# Patient Record
Sex: Male | Born: 1960
Health system: Southern US, Community
[De-identification: ages and names within clinical notes are randomized; demographics above are authoritative.]

## PROBLEM LIST (undated history)

## (undated) DIAGNOSIS — M1990 Unspecified osteoarthritis, unspecified site: Secondary | ICD-10-CM

## (undated) DIAGNOSIS — I2510 Atherosclerotic heart disease of native coronary artery without angina pectoris: Secondary | ICD-10-CM

## (undated) DIAGNOSIS — I10 Essential (primary) hypertension: Secondary | ICD-10-CM

## (undated) HISTORY — PX: FRACTURE SURGERY: SHX138

## (undated) HISTORY — PX: CARDIAC CATHETERIZATION: SHX172

## (undated) HISTORY — PX: HERNIA REPAIR: SHX51

## (undated) HISTORY — DX: Essential (primary) hypertension: I10

## (undated) HISTORY — DX: Unspecified osteoarthritis, unspecified site: M19.90

---

## 1999-05-06 ENCOUNTER — Emergency Department (HOSPITAL_COMMUNITY): Admission: EM | Admit: 1999-05-06 | Discharge: 1999-05-06 | Payer: Self-pay

## 1999-07-27 ENCOUNTER — Emergency Department (HOSPITAL_COMMUNITY): Admission: EM | Admit: 1999-07-27 | Discharge: 1999-07-28 | Payer: Self-pay

## 1999-07-28 ENCOUNTER — Encounter: Payer: Self-pay | Admitting: Emergency Medicine

## 2002-03-12 ENCOUNTER — Encounter: Payer: Self-pay | Admitting: Emergency Medicine

## 2002-03-12 ENCOUNTER — Emergency Department (HOSPITAL_COMMUNITY): Admission: EM | Admit: 2002-03-12 | Discharge: 2002-03-12 | Payer: Self-pay | Admitting: Emergency Medicine

## 2004-01-26 ENCOUNTER — Emergency Department (HOSPITAL_COMMUNITY): Admission: EM | Admit: 2004-01-26 | Discharge: 2004-01-26 | Payer: Self-pay | Admitting: Emergency Medicine

## 2004-01-28 ENCOUNTER — Ambulatory Visit (HOSPITAL_COMMUNITY): Admission: RE | Admit: 2004-01-28 | Discharge: 2004-01-28 | Payer: Self-pay | Admitting: Orthopedic Surgery

## 2004-01-28 ENCOUNTER — Ambulatory Visit (HOSPITAL_BASED_OUTPATIENT_CLINIC_OR_DEPARTMENT_OTHER): Admission: RE | Admit: 2004-01-28 | Discharge: 2004-01-28 | Payer: Self-pay | Admitting: Orthopedic Surgery

## 2009-05-21 ENCOUNTER — Emergency Department (HOSPITAL_COMMUNITY): Admission: EM | Admit: 2009-05-21 | Discharge: 2009-05-21 | Payer: Self-pay | Admitting: Emergency Medicine

## 2011-04-08 NOTE — Op Note (Signed)
NAME:  John Delacruz, John Delacruz                           ACCOUNT NO.:  [id number]   MEDICAL RECORD NO.:  [id number]                   PATIENT TYPE:  AMB   LOCATION:  DSC                                  FACILITY:  [facility]   PHYSICIAN:  Matthew A. Mina Marble, M.D.           DATE OF BIRTH:  08/30/1961   DATE OF PROCEDURE:  01/28/2004  DATE OF DISCHARGE:                                 OPERATIVE REPORT   PREOPERATIVE DIAGNOSIS:  Displaced intra-articular left distal radius  fracture.   POSTOPERATIVE DIAGNOSIS:  Displaced intra-articular left distal radius  fracture.   PROCEDURE:  Open reduction and internal fixation of above fracture using DVR  plate and screws.   SURGEON:  Artist Pais. Mina Marble, M.D.   ASSISTANT:  None.   ANESTHESIA:  General.   TOURNIQUET TIME:  1 hour 5 minutes.   COMPLICATIONS:  None.   DRAINS:  None.   DESCRIPTION OF PROCEDURE:  The patient was taken to the operating room and  after the induction of adequate general anesthesia, the left upper extremity  was prepped and draped in the usual sterile fashion.  An Esmarch was used to  exsanguinate the limb.  The tourniquet was inflated to 250 mmHg.  At this  point in time a longitudinal incision was made over the palpable border of  the flexor carpi radialis tendon in the distal forearm and wrist area left  side.  Dissection was taken down through the skin and subcutaneous tissue.  The sheath overlying the FCR was incised.  The FCR was retracted in the  midline.  The fascia underlying the muscle was incised.  The radial artery  was retracted laterally.  Incision was developed down to the pronator  quadratus muscle.  The pronator quadratus was seen to be stripped off the  distal radius in the area of the fracture. It was subperiosteally dissected  off the distal radius in an L-shaped fashion, thus exposing the fracture  site.  The first dorsal compartment and brachial radialis insertion were  also incised, thus helping  reduce the radial styloid fragment. After this  was done, and temporary fixation was achieved using the DVR plate and K-  wires, intraoperative x-ray revealed good placement in both the AP and  lateral plane. This was then fixed using a single cortical screw in the  slotted hole following by placing all of the pegs anatomically under direct  fluoroscopic guidance to reduce the fracture with the scaphoid and lunate  fragments.  The remaining cortical screws were placed.  The wound was then  thoroughly irrigated.  Intraoperative x-ray showed good reduction in both  the AP, lateral, and oblique view.  Pronator quadratus was repaired with 3-0  Vicryl and the skin with running 3-0 Prolene subcuticular stitch.  Steri-  Strips, 4x4's, fluffs, compressive dressing, and volar splint was applied.  The patient tolerated the procedure well and went to the recovery room in  stable  condition.                                              Artist Pais Mina Marble, M.D.   MAW/MEDQ  D:  01/28/2004  T:  01/29/2004  Job:  [id number]

## 2011-04-08 NOTE — Consult Note (Signed)
NAME:  John Delacruz, FIGIEL NO.:  [id number]   MEDICAL RECORD NO.:  [id number]                   PATIENT TYPE:  EMS   LOCATION:  MAJO                                 FACILITY:  [facility]   PHYSICIAN:  Artist Pais. Mina Marble, M.D.           DATE OF BIRTH:  1961-07-07   DATE OF CONSULTATION:  01/26/2004  DATE OF DISCHARGE:                                   CONSULTATION   CONSULTING PHYSICIAN:  Artist Pais. Mina Marble, M.D.   REQUESTING PHYSICIAN:  Dr. Payton Spark. Wentz.   REASON FOR CONSULTATION:  Mr. Chapin Arduini is a 50 year old right hand  dominant male who fell 10 feet off of a ladder while working and sustained  an injury to his nondominant left hand and wrist.  He is 50 years old, right  hand dominant, otherwise fairly healthy.   No known drug allergies.   No current medications.   No recent hospitalizations, surgeries.   FAMILY HISTORY:  Noncontributory.   SOCIAL HISTORY:  Noncontributory.   PHYSICAL EXAMINATION:  GENERAL:  A well-developed, well-nourished male,  pleasant, alert and oriented x 3.  MUSCULOSKELETAL:  Examination of his hand and wrist, on the left:  He is  swollen and tender with a marked deformity.  He is grossly neurovascularly  intact with median, radial and ulnar nerve function intact grossly.  Skin is  intact.  There is no __________, no adenopathy, no open areas but again  dorsal swelling, pain, and deformity.   X-rays show a dorsally angulated, comminuted, intra-articular fracture of  the distal radius, left side.   The patient was given a 2% plain lidocaine hematoma block.  Fingertrap  traction was applied with 15 pounds counter-traction across the radiocarpal  joint.  Closed reduction was performed.  Post reduction films showed  adequate reduction on the lateral view but significant intra-articular  comminution of the large radial styloid and a  lunate facet fragment.   IMPRESSION:  A 50 year old male, status post closed  reduction, with  continued intra-articular displacement of a radius fracture, nondominant  left side.   He is to report to my office tomorrow, we will set him up for surgical  intervention.  He was discharged with Percocet, #30, for pain, 1-2 every  three to four hours.  Ice, elevation.  He was instructed in the signs and  symptoms  of compartment syndrome and acute carpal tunnel, etcetera.  He is to call  our office immediately if any of these symptoms arise, if not we will see  him again tomorrow, Tuesday, January 27, 2004, for operative outpatient surgery  setup.                                               Artist Pais Mina Marble, M.D.    MAW/MEDQ  D:  01/26/2004  T:  01/27/2004  Job:  [id number]

## 2013-01-01 ENCOUNTER — Ambulatory Visit (INDEPENDENT_AMBULATORY_CARE_PROVIDER_SITE_OTHER): Payer: BC Managed Care – PPO | Admitting: Internal Medicine

## 2013-01-01 ENCOUNTER — Ambulatory Visit: Payer: BC Managed Care – PPO

## 2013-01-01 VITALS — BP 187/103 | HR 76 | Temp 98.5°F | Resp 16 | Ht 70.0 in | Wt 202.0 lb

## 2013-01-01 DIAGNOSIS — M545 Low back pain, unspecified: Secondary | ICD-10-CM

## 2013-01-01 DIAGNOSIS — F17200 Nicotine dependence, unspecified, uncomplicated: Secondary | ICD-10-CM

## 2013-01-01 DIAGNOSIS — I10 Essential (primary) hypertension: Secondary | ICD-10-CM

## 2013-01-01 DIAGNOSIS — Z719 Counseling, unspecified: Secondary | ICD-10-CM

## 2013-01-01 LAB — POCT CBC
Granulocyte percent: 70.6 %G (ref 37–80)
HCT, POC: 50.1 % (ref 43.5–53.7)
Hemoglobin: 16 g/dL (ref 14.1–18.1)
Lymph, poc: 2.2 (ref 0.6–3.4)
MCH, POC: 28.2 pg (ref 27–31.2)
MCHC: 31.9 g/dL (ref 31.8–35.4)
MCV: 88.4 fL (ref 80–97)
MID (cbc): 0.7 (ref 0–0.9)
MPV: 9.8 fL (ref 0–99.8)
POC Granulocyte: 7.1 — AB (ref 2–6.9)
POC LYMPH PERCENT: 22.3 %L (ref 10–50)
POC MID %: 7.1 %M (ref 0–12)
Platelet Count, POC: 249 10*3/uL (ref 142–424)
RBC: 5.67 M/uL (ref 4.69–6.13)
RDW, POC: 14.9 %
WBC: 10 10*3/uL (ref 4.6–10.2)

## 2013-01-01 LAB — POCT URINALYSIS DIPSTICK
Bilirubin, UA: NEGATIVE
Glucose, UA: NEGATIVE
Leukocytes, UA: NEGATIVE
Nitrite, UA: NEGATIVE
Protein, UA: >=300
Spec Grav, UA: >=1.03
Urobilinogen, UA: 0.2
pH, UA: 5.5

## 2013-01-01 LAB — POCT UA - MICROSCOPIC ONLY
Bacteria, U Microscopic: NEGATIVE
Casts, Ur, LPF, POC: NEGATIVE
Crystals, Ur, HPF, POC: NEGATIVE
Mucus, UA: NEGATIVE
Yeast, UA: NEGATIVE

## 2013-01-01 LAB — POCT SEDIMENTATION RATE: POCT SED RATE: 13 mm/hr (ref 0–22)

## 2013-01-01 MED ORDER — METHOCARBAMOL 750 MG PO TABS: 750.0000 mg | ORAL_TABLET | Freq: Four times a day (QID) | ORAL | Status: DC

## 2013-01-01 MED ORDER — HYDROCODONE-ACETAMINOPHEN 5-325 MG PO TABS: 1.0000 | ORAL_TABLET | Freq: Four times a day (QID) | ORAL | Status: DC | PRN

## 2013-01-01 MED ORDER — IBUPROFEN 800 MG PO TABS: 800.0000 mg | ORAL_TABLET | Freq: Three times a day (TID) | ORAL | Status: DC | PRN

## 2013-01-01 NOTE — Patient Instructions (Signed)
Back Pain, Adult Low back pain is very common. About 1 in 5 people have back pain.The cause of low back pain is rarely dangerous. The pain often gets better over time.About half of people with a sudden onset of back pain feel better in just 2 weeks. About 8 in 10 people feel better by 6 weeks.  CAUSES Some common causes of back pain include:  Strain of the muscles or ligaments supporting the spine.  Wear and tear (degeneration) of the spinal discs.  Arthritis.  Direct injury to the back. DIAGNOSIS Most of the time, the direct cause of low back pain is not known.However, back pain can be treated effectively even when the exact cause of the pain is unknown.Answering your caregiver's questions about your overall health and symptoms is one of the most accurate ways to make sure the cause of your pain is not dangerous. If your caregiver needs more information, he or she may order lab work or imaging tests (X-rays or MRIs).However, even if imaging tests show changes in your back, this usually does not require surgery. HOME CARE INSTRUCTIONS For many people, back pain returns.Since low back pain is rarely dangerous, it is often a condition that people can learn to manageon their own.   Remain active. It is stressful on the back to sit or stand in one place. Do not sit, drive, or stand in one place for more than 30 minutes at a time. Take short walks on level surfaces as soon as pain allows.Try to increase the length of time you walk each day.  Do not stay in bed.Resting more than 1 or 2 days can delay your recovery.  Do not avoid exercise or work.Your body is made to move.It is not dangerous to be active, even though your back may hurt.Your back will likely heal faster if you return to being active before your pain is gone.  Pay attention to your body when you bend and lift. Many people have less discomfortwhen lifting if they bend their knees, keep the load close to their bodies,and  avoid twisting. Often, the most comfortable positions are those that put less stress on your recovering back.  Find a comfortable position to sleep. Use a firm mattress and lie on your side with your knees slightly bent. If you lie on your back, put a pillow under your knees.  Only take over-the-counter or prescription medicines as directed by your caregiver. Over-the-counter medicines to reduce pain and inflammation are often the most helpful.Your caregiver may prescribe muscle relaxant drugs.These medicines help dull your pain so you can more quickly return to your normal activities and healthy exercise.  Put ice on the injured area.  Put ice in a plastic bag.  Place a towel between your skin and the bag.  Leave the ice on for 15 to 20 minutes, 3 to 4 times a day for the first 2 to 3 days. After that, ice and heat may be alternated to reduce pain and spasms.  Ask your caregiver about trying back exercises and gentle massage. This may be of some benefit.  Avoid feeling anxious or stressed.Stress increases muscle tension and can worsen back pain.It is important to recognize when you are anxious or stressed and learn ways to manage it.Exercise is a great option. SEEK MEDICAL CARE IF:  You have pain that is not relieved with rest or medicine.  You have pain that does not improve in 1 week.  You have new symptoms.  You are generally   not feeling well. SEEK IMMEDIATE MEDICAL CARE IF:   You have pain that radiates from your back into your legs.  You develop new bowel or bladder control problems.  You have unusual weakness or numbness in your arms or legs.  You develop nausea or vomiting.  You develop abdominal pain.  You feel faint. Document Released: 11/07/2005 Document Revised: 05/08/2012 Document Reviewed: 03/28/2011 [employer] Patient Information 2013 [employer].  

## 2013-01-01 NOTE — Progress Notes (Signed)
Subjective:    Patient ID: John Delacruz, male    DOB: May 25, 1961, 52 y.o.   MRN: [id number]  HPI C/o upper lumbar pain for 2 weeks and thinks he injured himself doing the hard labour or heavy weight lifting work out. Has no pain with any motion during the day and can do his job, only has pain at night lying on his back and it can wake him up. No blood in stool or urine change. No fatigue, nite sweats, weight loss or feeling ill. Has htn and not taking his meds, continues to smoke too much and drink 2-3 beers a day.   Review of Systems     Objective:   Physical Exam  Vitals reviewed. Constitutional: He is oriented to person, place, and time. He appears well-developed and well-nourished.  Cardiovascular: Normal rate, regular rhythm, normal heart sounds and intact distal pulses.   No murmur heard. Pulmonary/Chest: Effort normal and breath sounds normal. He exhibits no tenderness.  Abdominal: Soft. He exhibits mass. He exhibits no abdominal bruit. There is no tenderness. There is no rebound, no guarding and no CVA tenderness.  Musculoskeletal: He exhibits tenderness.       Lumbar back: He exhibits tenderness and pain. He exhibits normal range of motion, no bony tenderness, no deformity and normal pulse.  Neurological: He is alert and oriented to person, place, and time. No cranial nerve deficit. He exhibits normal muscle tone. Coordination normal.  Skin: No rash noted.  Psychiatric: He has a normal mood and affect.   180/98 Results for orders placed in visit on 01/01/13  POCT CBC      Result Value Range   WBC 10.0  4.6 - 10.2 K/uL   Lymph, poc 2.2  0.6 - 3.4   POC LYMPH PERCENT 22.3  10 - 50 %L   MID (cbc) 0.7  0 - 0.9   POC MID % 7.1  0 - 12 %M   POC Granulocyte 7.1 (*) 2 - 6.9   Granulocyte percent 70.6  37 - 80 %G   RBC 5.67  4.69 - 6.13 M/uL   Hemoglobin 16.0  14.1 - 18.1 g/dL   HCT, POC 78.2  95.6 - 53.7 %   MCV 88.4  80 - 97 fL   MCH, POC 28.2  27 - 31.2 pg   MCHC 31.9   31.8 - 35.4 g/dL   RDW, POC 21.3     Platelet Count, POC 249  142 - 424 K/uL   MPV 9.8  0 - 99.8 fL  POCT UA - MICROSCOPIC ONLY      Result Value Range   WBC, Ur, HPF, POC 0-2     RBC, urine, microscopic 0-3     Bacteria, U Microscopic neg     Mucus, UA neg     Epithelial cells, urine per micros 0-2     Crystals, Ur, HPF, POC neg     Casts, Ur, LPF, POC neg     Yeast, UA neg    POCT URINALYSIS DIPSTICK      Result Value Range   Color, UA yellow     Clarity, UA clear     Glucose, UA neg     Bilirubin, UA neg     Ketones, UA trace     Spec Grav, UA >=1.030     Blood, UA trace-lysed     pH, UA 5.5     Protein, UA >=300     Urobilinogen, UA 0.2  Nitrite, UA neg     Leukocytes, UA Negative       [facility] reading (PRIMARY) by  Dr.Keirra Zeimet mild spondylosis, NAD      Assessment & Plan:  Stop smoking/Take bp meds/ Decrease or stop alcohol Back stretches/Motrin/HC at hs prn CT scan if pain persists

## 2013-01-02 ENCOUNTER — Encounter: Payer: Self-pay | Admitting: *Deleted

## 2013-12-18 ENCOUNTER — Emergency Department (HOSPITAL_COMMUNITY)
Admission: EM | Admit: 2013-12-18 | Discharge: 2013-12-18 | Disposition: A | Discharge status: Other Acute Inpatient Hospital | Payer: BC Managed Care – PPO | Source: Home / Self Care | Attending: Emergency Medicine | Admitting: Emergency Medicine

## 2013-12-18 ENCOUNTER — Emergency Department (HOSPITAL_COMMUNITY): Payer: BC Managed Care – PPO

## 2013-12-18 ENCOUNTER — Inpatient Hospital Stay (HOSPITAL_COMMUNITY)
Admission: EM | Admit: 2013-12-18 | Discharge: 2013-12-19 | DRG: 305 | Disposition: A | Discharge status: Home / Self Care | Payer: BC Managed Care – PPO | Attending: Internal Medicine | Admitting: Internal Medicine

## 2013-12-18 ENCOUNTER — Encounter (HOSPITAL_COMMUNITY): Payer: Self-pay | Admitting: Emergency Medicine

## 2013-12-18 DIAGNOSIS — E876 Hypokalemia: Secondary | ICD-10-CM | POA: Diagnosis present

## 2013-12-18 DIAGNOSIS — K819 Cholecystitis, unspecified: Secondary | ICD-10-CM

## 2013-12-18 DIAGNOSIS — K5732 Diverticulitis of large intestine without perforation or abscess without bleeding: Secondary | ICD-10-CM

## 2013-12-18 DIAGNOSIS — K5792 Diverticulitis of intestine, part unspecified, without perforation or abscess without bleeding: Secondary | ICD-10-CM

## 2013-12-18 DIAGNOSIS — Z9119 Patient's noncompliance with other medical treatment and regimen: Secondary | ICD-10-CM

## 2013-12-18 DIAGNOSIS — Z79899 Other long term (current) drug therapy: Secondary | ICD-10-CM

## 2013-12-18 DIAGNOSIS — F17200 Nicotine dependence, unspecified, uncomplicated: Secondary | ICD-10-CM | POA: Diagnosis present

## 2013-12-18 DIAGNOSIS — I10 Essential (primary) hypertension: Principal | ICD-10-CM

## 2013-12-18 DIAGNOSIS — Z91199 Patient's noncompliance with other medical treatment and regimen due to unspecified reason: Secondary | ICD-10-CM

## 2013-12-18 LAB — POCT I-STAT TROPONIN I: Troponin i, poc: 0 ng/mL (ref 0.00–0.08)

## 2013-12-18 LAB — POCT URINALYSIS DIP (DEVICE)
Glucose, UA: NEGATIVE mg/dL
Leukocytes, UA: NEGATIVE
Nitrite: NEGATIVE
Protein, ur: >=300 mg/dL — AB
Specific Gravity, Urine: >=1.03 (ref 1.005–1.030)
Urobilinogen, UA: 0.2 mg/dL (ref 0.0–1.0)
pH: 6 (ref 5.0–8.0)

## 2013-12-18 LAB — CBC
HCT: 45.1 % (ref 39.0–52.0)
Hemoglobin: 15.9 g/dL (ref 13.0–17.0)
MCH: 29.6 pg (ref 26.0–34.0)
MCHC: 35.3 g/dL (ref 30.0–36.0)
MCV: 83.8 fL (ref 78.0–100.0)
Platelets: 191 10*3/uL (ref 150–400)
RBC: 5.38 MIL/uL (ref 4.22–5.81)
RDW: 14.1 % (ref 11.5–15.5)
WBC: 9.7 10*3/uL (ref 4.0–10.5)

## 2013-12-18 LAB — COMPREHENSIVE METABOLIC PANEL
ALT: 16 U/L (ref 0–53)
AST: 19 U/L (ref 0–37)
Albumin: 3.7 g/dL (ref 3.5–5.2)
Alkaline Phosphatase: 79 U/L (ref 39–117)
BUN: 15 mg/dL (ref 6–23)
CO2: 26 mEq/L (ref 19–32)
Calcium: 9.6 mg/dL (ref 8.4–10.5)
Chloride: 99 mEq/L (ref 96–112)
Creatinine, Ser: 1.64 mg/dL — ABNORMAL HIGH (ref 0.50–1.35)
GFR calc Af Amer: 53 mL/min — ABNORMAL LOW (ref >90)
GFR calc non Af Amer: 46 mL/min — ABNORMAL LOW (ref >90)
Glucose, Bld: 113 mg/dL — ABNORMAL HIGH (ref 70–99)
Potassium: 3.4 mEq/L — ABNORMAL LOW (ref 3.7–5.3)
Sodium: 139 mEq/L (ref 137–147)
Total Bilirubin: 0.6 mg/dL (ref 0.3–1.2)
Total Protein: 7.8 g/dL (ref 6.0–8.3)

## 2013-12-18 LAB — TROPONIN I: Troponin I: <0.3 ng/mL (ref <0.30)

## 2013-12-18 LAB — CBC WITH DIFFERENTIAL/PLATELET
Basophils Absolute: 0 10*3/uL (ref 0.0–0.1)
Basophils Relative: 0 % (ref 0–1)
Eosinophils Absolute: 0.1 10*3/uL (ref 0.0–0.7)
Eosinophils Relative: 1 % (ref 0–5)
HCT: 46.7 % (ref 39.0–52.0)
Hemoglobin: 16.6 g/dL (ref 13.0–17.0)
Lymphocytes Relative: 12 % (ref 12–46)
Lymphs Abs: 1.4 10*3/uL (ref 0.7–4.0)
MCH: 29.7 pg (ref 26.0–34.0)
MCHC: 35.5 g/dL (ref 30.0–36.0)
MCV: 83.7 fL (ref 78.0–100.0)
Monocytes Absolute: 1.2 10*3/uL — ABNORMAL HIGH (ref 0.1–1.0)
Monocytes Relative: 11 % (ref 3–12)
Neutro Abs: 8.3 10*3/uL — ABNORMAL HIGH (ref 1.7–7.7)
Neutrophils Relative %: 76 % (ref 43–77)
Platelets: 197 10*3/uL (ref 150–400)
RBC: 5.58 MIL/uL (ref 4.22–5.81)
RDW: 14 % (ref 11.5–15.5)
WBC: 11 10*3/uL — ABNORMAL HIGH (ref 4.0–10.5)

## 2013-12-18 LAB — CREATININE, SERUM
Creatinine, Ser: 1.48 mg/dL — ABNORMAL HIGH (ref 0.50–1.35)
GFR calc Af Amer: 60 mL/min — ABNORMAL LOW (ref >90)
GFR calc non Af Amer: 52 mL/min — ABNORMAL LOW (ref >90)

## 2013-12-18 LAB — LIPASE, BLOOD: Lipase: 29 U/L (ref 11–59)

## 2013-12-18 MED ORDER — SODIUM CHLORIDE 0.9 % IV SOLN: INTRAVENOUS | Status: DC

## 2013-12-18 MED ORDER — ONDANSETRON HCL 4 MG PO TABS: 4.0000 mg | ORAL_TABLET | Freq: Four times a day (QID) | ORAL | Status: DC | PRN

## 2013-12-18 MED ORDER — SODIUM CHLORIDE 0.9 % IJ SOLN: 3.0000 mL | INTRAMUSCULAR | Status: DC | PRN

## 2013-12-18 MED ORDER — SODIUM CHLORIDE 0.9 % IV BOLUS (SEPSIS)
1000.0000 mL | Freq: Once | INTRAVENOUS | Status: AC
  Administered 2013-12-18: 1000 mL via INTRAVENOUS

## 2013-12-18 MED ORDER — HYDRALAZINE HCL 20 MG/ML IJ SOLN
5.0000 mg | Freq: Once | INTRAMUSCULAR | Status: AC
  Administered 2013-12-18: 5 mg via INTRAVENOUS
  Filled 2013-12-18: qty 1

## 2013-12-18 MED ORDER — DILTIAZEM HCL ER COATED BEADS 240 MG PO CP24
240.0000 mg | ORAL_CAPSULE | Freq: Once | ORAL | Status: AC
  Administered 2013-12-18: 240 mg via ORAL
  Filled 2013-12-18: qty 1

## 2013-12-18 MED ORDER — METRONIDAZOLE IN NACL 5-0.79 MG/ML-% IV SOLN
500.0000 mg | Freq: Once | INTRAVENOUS | Status: DC
  Administered 2013-12-18: 500 mg via INTRAVENOUS
  Filled 2013-12-18: qty 100

## 2013-12-18 MED ORDER — IOHEXOL 300 MG/ML  SOLN
80.0000 mL | Freq: Once | INTRAMUSCULAR | Status: AC | PRN
  Administered 2013-12-18: 80 mL via INTRAVENOUS

## 2013-12-18 MED ORDER — POTASSIUM CHLORIDE IN NACL 20-0.9 MEQ/L-% IV SOLN
INTRAVENOUS | Status: DC
  Administered 2013-12-18: 22:00:00 via INTRAVENOUS
  Filled 2013-12-18 (×2): qty 1000

## 2013-12-18 MED ORDER — HYDRALAZINE HCL 25 MG PO TABS
25.0000 mg | ORAL_TABLET | Freq: Four times a day (QID) | ORAL | Status: DC | PRN
  Administered 2013-12-19: 25 mg via ORAL
  Filled 2013-12-18: qty 1

## 2013-12-18 MED ORDER — METRONIDAZOLE IN NACL 5-0.79 MG/ML-% IV SOLN
500.0000 mg | Freq: Three times a day (TID) | INTRAVENOUS | Status: DC
  Administered 2013-12-19 (×2): 500 mg via INTRAVENOUS
  Filled 2013-12-18 (×4): qty 100

## 2013-12-18 MED ORDER — DILTIAZEM HCL ER COATED BEADS 120 MG PO TB24: 240.0000 mg | ORAL_TABLET | Freq: Every day | ORAL | Status: DC

## 2013-12-18 MED ORDER — IOHEXOL 300 MG/ML  SOLN
25.0000 mL | Freq: Once | INTRAMUSCULAR | Status: AC | PRN
  Administered 2013-12-18: 25 mL via ORAL

## 2013-12-18 MED ORDER — DILTIAZEM HCL ER COATED BEADS 240 MG PO CP24
240.0000 mg | ORAL_CAPSULE | Freq: Every day | ORAL | Status: DC
Start: 2013-12-19 — End: 2013-12-19
  Administered 2013-12-19: 240 mg via ORAL
  Filled 2013-12-18: qty 1

## 2013-12-18 MED ORDER — LABETALOL HCL 5 MG/ML IV SOLN
5.0000 mg | Freq: Once | INTRAVENOUS | Status: AC
  Administered 2013-12-18: 5 mg via INTRAVENOUS
  Filled 2013-12-18: qty 4

## 2013-12-18 MED ORDER — ENOXAPARIN SODIUM 40 MG/0.4ML ~~LOC~~ SOLN
40.0000 mg | SUBCUTANEOUS | Status: DC
  Filled 2013-12-18 (×2): qty 0.4

## 2013-12-18 MED ORDER — SODIUM CHLORIDE 0.9 % IV SOLN: 250.0000 mL | INTRAVENOUS | Status: DC | PRN

## 2013-12-18 MED ORDER — CIPROFLOXACIN IN D5W 400 MG/200ML IV SOLN
400.0000 mg | Freq: Once | INTRAVENOUS | Status: DC
  Administered 2013-12-18: 400 mg via INTRAVENOUS
  Filled 2013-12-18: qty 200

## 2013-12-18 MED ORDER — ONDANSETRON HCL 4 MG/2ML IJ SOLN: 4.0000 mg | Freq: Four times a day (QID) | INTRAMUSCULAR | Status: DC | PRN

## 2013-12-18 MED ORDER — HYDROCODONE-ACETAMINOPHEN 5-325 MG PO TABS: 1.0000 | ORAL_TABLET | ORAL | Status: DC | PRN

## 2013-12-18 MED ORDER — LISINOPRIL 10 MG PO TABS
10.0000 mg | ORAL_TABLET | Freq: Every day | ORAL | Status: DC
  Administered 2013-12-18: 10 mg via ORAL
  Filled 2013-12-18 (×2): qty 1

## 2013-12-18 MED ORDER — CIPROFLOXACIN IN D5W 400 MG/200ML IV SOLN
400.0000 mg | Freq: Two times a day (BID) | INTRAVENOUS | Status: DC
  Administered 2013-12-19: 400 mg via INTRAVENOUS
  Filled 2013-12-18 (×3): qty 200

## 2013-12-18 MED ORDER — SODIUM CHLORIDE 0.9 % IJ SOLN: 3.0000 mL | Freq: Two times a day (BID) | INTRAMUSCULAR | Status: DC

## 2013-12-18 NOTE — Progress Notes (Signed)
Received report from [facility]. Patient expected soon.

## 2013-12-18 NOTE — ED Notes (Signed)
[facility]

## 2013-12-18 NOTE — H&P (Signed)
PCP:   Hortencia Pilar, MD   Chief Complaint:  Abdominal pain  HPI: 53 year old male who   has a past medical history of Hypertension. today present to the ED abdominal pain for last 3 days. He did not have vomiting but had nausea, denies any fever but had loose bowel movements x2, he denies blood in the stool. Patient denies pain at this time but his he had pain but became to the ED initially. CT scan of the abdomen pelvis done in the ED shows focal diverticulitis in the ascending colon. Patient also has history of hypertension and has not been compliant with his medications. Patient was found to be in hypertensive urgency but did not have chest pain or shortness of breath first set of troponin is negative in the ED. EKG shows nonspecific T wave changes. In the ED patient has been given Cipro and Flagyl, 1 dose of labetalol and hydralazine and also give him him dose of his Cardizem 240 mg , but the blood pressure continues to be around 200/110.   Allergies:  No Known Allergies    Past Medical History  Diagnosis Date  . Hypertension     Past Surgical History  Procedure Laterality Date  . Hernia repair    . Fracture surgery      Prior to Admission medications   Medication Sig Start Date End Date Taking? Authorizing Provider  diltiazem (CARDIZEM LA) 120 MG 24 hr tablet Take 240 mg by mouth daily.   Yes Historical Provider, MD    Social History:  reports that he has been smoking Cigarettes.  He has been smoking about 0.80 packs per day. He does not have any smokeless tobacco history on file. He reports that he drinks alcohol. He reports that he does not use illicit drugs.  Family History  Problem Relation Age of Onset  . Hypertension Mother   . Kidney disease Mother   . Lupus Sister      All the positives are listed in BOLD  Review of Systems:  HEENT: Headache, blurred vision, runny nose, sore throat Neck: Hypothyroidism, hyperthyroidism,,lymphadenopathy Chest : Shortness  of breath, history of COPD, Asthma Heart : Chest pain, history of coronary arterey disease GI:  Nausea, vomiting, diarrhea, constipation, GERD GU: Dysuria, urgency, frequency of urination, hematuria Neuro: Stroke, seizures, syncope Psych: Depression, anxiety, hallucinations   Physical Exam: Blood pressure 222/100, pulse 77, temperature 98.2 F (36.8 C), temperature source Oral, resp. rate 18, height _0  (1.753 m), weight 92.987 kg (205 lb), SpO2 95.00%. Constitutional:   Patient is a well-developed and well-nourished male* in no acute distress and cooperative with exam. Head: Normocephalic and atraumatic Mouth: Mucus membranes moist Eyes: PERRL, EOMI, conjunctivae normal Neck: Supple, No Thyromegaly Cardiovascular: RRR, S1 normal, S2 normal Pulmonary/Chest: CTAB, no wheezes, rales, or rhonchi Abdominal: Soft.  mild tenderness to palpation in the right quadrant, non-distended, bowel sounds are normal, no masses, organomegaly, or guarding present.  Neurological: A&O x3, Strenght is normal and symmetric bilaterally, cranial nerve II-XII are grossly intact, no focal motor deficit, sensory intact to light touch bilaterally.  Extremities : No Cyanosis, Clubbing or Edema   Labs on Admission:  Results for orders placed during the hospital encounter of 12/18/13 (from the past 48 hour(s))  CBC WITH DIFFERENTIAL     Status: Abnormal   Collection Time    12/18/13 11:47 AM      Result Value Range   WBC 11.0 (*) 4.0 - 10.5 K/uL   RBC 5.58  4.22 - 5.81 MIL/uL   Hemoglobin 16.6  13.0 - 17.0 g/dL   HCT 46.7  39.0 - 52.0 %   MCV 83.7  78.0 - 100.0 fL   MCH 29.7  26.0 - 34.0 pg   MCHC 35.5  30.0 - 36.0 g/dL   RDW 14.0  11.5 - 15.5 %   Platelets 197  150 - 400 K/uL   Neutrophils Relative % 76  43 - 77 %   Neutro Abs 8.3 (*) 1.7 - 7.7 K/uL   Lymphocytes Relative 12  12 - 46 %   Lymphs Abs 1.4  0.7 - 4.0 K/uL   Monocytes Relative 11  3 - 12 %   Monocytes Absolute 1.2 (*) 0.1 - 1.0 K/uL    Eosinophils Relative 1  0 - 5 %   Eosinophils Absolute 0.1  0.0 - 0.7 K/uL   Basophils Relative 0  0 - 1 %   Basophils Absolute 0.0  0.0 - 0.1 K/uL  COMPREHENSIVE METABOLIC PANEL     Status: Abnormal   Collection Time    12/18/13 11:47 AM      Result Value Range   Sodium 139  137 - 147 mEq/L   Potassium 3.4 (*) 3.7 - 5.3 mEq/L   Chloride 99  96 - 112 mEq/L   CO2 26  19 - 32 mEq/L   Glucose, Bld 113 (*) 70 - 99 mg/dL   BUN 15  6 - 23 mg/dL   Creatinine, Ser 1.64 (*) 0.50 - 1.35 mg/dL   Calcium 9.6  8.4 - 10.5 mg/dL   Total Protein 7.8  6.0 - 8.3 g/dL   Albumin 3.7  3.5 - 5.2 g/dL   AST 19  0 - 37 U/L   ALT 16  0 - 53 U/L   Alkaline Phosphatase 79  39 - 117 U/L   Total Bilirubin 0.6  0.3 - 1.2 mg/dL   GFR calc non Af Amer 46 (*) >90 mL/min   GFR calc Af Amer 53 (*) >90 mL/min   Comment: (NOTE)     The eGFR has been calculated using the CKD EPI equation.     This calculation has not been validated in all clinical situations.     eGFR's persistently <90 mL/min signify possible Chronic Kidney     Disease.  LIPASE, BLOOD     Status: None   Collection Time    12/18/13 11:47 AM      Result Value Range   Lipase 29  11 - 59 U/L    Radiological Exams on Admission: US Abdomen Complete  12/18/2013   CLINICAL DATA:  Abdominal pain  EXAM: ULTRASOUND ABDOMEN COMPLETE  COMPARISON:  None.  FINDINGS: Gallbladder:  No gallstones or wall thickening visualized. No sonographic Murphy sign noted.  Common bile duct:  Diameter: 5.2 mm which is within normal limits  Liver:  The background hepatic parenchyma is echogenic and coarsened in appearance. However, there are several geographic and somewhat ill-defined regions of relative hypo echogenicity in the left hepatic lobe and adjacent to the gallbladder fossa. The larger focus in the left hepatic lobe measures 3 x 5 x 3 cm wall a smaller focus abutting the gallbladder fossa measures 1.7 x 1.2 x 1.2 cm. No evidence of abnormal vascularity within the  lesions.  IVC:  No abnormality visualized.  Pancreas:  Visualized portion unremarkable.  Spleen:  Size and appearance within normal limits.  Right Kidney:  Length: 9.7 cm. Echogenicity within normal limits. No solid  mass or hydronephrosis visualized. Small 1.2 x 0.8 x 0.9 cm hypoechoic simple cyst in the lower pole of the kidney.  Left Kidney:  Length: 11.2 cm. Echogenicity within normal limits. No mass or hydronephrosis visualized.  Abdominal aorta:  No aneurysm visualized.  Other findings:  None.  IMPRESSION: 1. No acute abnormality. Specifically, no evidence of cholelithiasis or cholecystitis. 2. Relatively increased echogenicity of the majority of the liver concerning for hepatic steatosis. 3. Two geographic areas of relative hypo echogenicity in the hepatic parenchyma in the left hepatic lobe, and adjacent to the gallbladder fossa are strongly suspected to represent areas of focal fatty sparing. Recommend follow-up abdominal ultrasound in 3 months to assess stability. If more definitive imaging confirmation is desired, MRI of the abdomen with and without contrast would be diagnostic.   Electronically Signed   By: Jacqulynn Cadet M.D.   On: 12/18/2013 13:45   Ct Abdomen Pelvis W Contrast  12/18/2013   CLINICAL DATA:  Abdominal pain.  EXAM: CT ABDOMEN AND PELVIS WITH CONTRAST  TECHNIQUE: Multidetector CT imaging of the abdomen and pelvis was performed using the standard protocol following bolus administration of intravenous contrast.  CONTRAST:  27m OMNIPAQUE IOHEXOL 300 MG/ML  SOLN  COMPARISON:  Ultrasound dated 12/18/2013  FINDINGS: There is focal diverticulitis of the ascending colon just below the hepatic flexure with pericolonic soft tissue stranding but without a diverticular abscess or perforation. This is just below the gallbladder.  There is slight inhomogeneity of the liver parenchyma in the dome of the right lobe, probably representing focal hepatic steatosis. There is a similar area in the left  lobe. This is quite subtle.  Biliary tree, spleen, pancreas, adrenal glands, and kidneys are normal. Terminal ileum and appendix are normal. There are few small diverticula in the distal colon. No free air or free fluid.  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  IMPRESSION: Focal diverticulitis of the ascending colon just below the hepatic flexure.   Electronically Signed   By: JRozetta NunneryM.D.   On: 12/18/2013 16:22    Assessment/Plan Active Problems:   HTN (hypertension)   Diverticulitis Hypokalemia  Diverticulitis Patient will be started on Cipro and Flagyl. We'll keep the patient n.p.o. start IV normal saline at 75 mL per hour.  Hypertensive urgency   patient has been noncompliant with his Cardizem at home, will resume Cardizem 240 mg by mouth daily and also start lisinopril 10 mg by mouth daily. Will give hydralazine 25 every 6 hours when necessary for BP more than 160/100. Will also check component IV every 6 hours x3 and 2-D echocardiogram in the morning  Hypokalemia Replace potassium and check BMP in the morning  Code status: full code   Family discussion:No family at bedside    Time Spent on Admission: 528min  [facility] Pager: [phone]/28/2015, 6:48 PM  If 7PM-7AM, please contact night-coverage  [email or web address]  Password [email or web address]

## 2013-12-18 NOTE — ED Notes (Addendum)
Pt is an [facility] transfer for further eval/treatment of possible Cholecystitis.  Pt hypertensive in triage.  Pt states he has not taken his BP meds in a couple days because "it slips my mind".

## 2013-12-18 NOTE — ED Provider Notes (Signed)
  Chief Complaint   Chief Complaint  Patient presents with  . Abdominal Pain    History of Present Illness   John Delacruz is a 53 year old male who has had a three-day history of red quadrant pain radiating through to his back. The pain came on gradually and there was no obvious precipitating factor. The pain is worse with standing. He has not eaten any meals since then, he has been drinking liquids but this did not make the pain worse. She's felt nauseated without any vomiting. He denies any fever or chills. He has had some loose stools but no blood in the stool. No prior history of gallbladder disease or ulcer disease.  Review of Systems   Other than as noted above, the patient denies any of the following symptoms: Constitutional:  No fever, chills, fatigue, weight loss or anorexia. Lungs:  No cough or shortness of breath. Heart:  No chest pain, palpitations, syncope or edema.  No cardiac history. Abdomen:  No nausea, vomiting, hematememesis, melena, diarrhea, or hematochezia. GU:  No dysuria, frequency, urgency, or hematuria.  No testicular pain or swelling.  PMFSH   Past medical history, family history, social history, meds, and allergies were reviewed along with nurse's notes.  No prior abdominal surgeries or history of GI problems.  No use of NSAIDs or aspirin.  No excessive  alcohol intake.   Physical Examination     Vital signs:  BP 204/107  Pulse 67  Temp(Src) 98 F (36.7 C) (Oral)  Resp 18  Ht 5\' 9"  (1.753 m)  Wt 205 lb (92.987 kg)  BMI 30.26 kg/m2  SpO2 99% Gen:  Alert, oriented, in no distress. Lungs:  Breath sounds clear and equal bilaterally.  No wheezes, rales or rhonchi. Heart:  Regular rhythm.  No gallops or murmers.   Abdomen:  Soft, flat, nondistended. No organomegaly or mass. Bowel sounds are normally active. He has tenderness with guarding in the right upper quadrant with a positive Murphy's sign and Murphy's punch. Abdomen is otherwise nontender. Skin:   Clear, warm and dry.  No rash.   EKG Results    Date: 12/18/2013  Rate: 61  Rhythm: normal sinus rhythm  QRS Axis: normal  Intervals: normal  ST/T Wave abnormalities: normal  Conduction Disutrbances:none  Narrative Interpretation: Normal sinus rhythm, normal EKG.  Old EKG Reviewed: none available  Assessment   The encounter diagnosis was Cholecystitis.  Plan     The patient was transferred to the ED via shuttle in stable condition.  Medical Decision Making     53 year old has a 3 day history of RUQ pain radiating to back.  Feels nausea, but no vomiting or fever.  No history of ulcer or gallbladder disease.  On exam he is tender in RUQ with pos Murphy's sign and Murphy's punch.  Suggests acute cholecystitis.  We're doing and EKG and will transfer via shuttle if the EKG is OK.      Reuben Likesavid C Latwan Luchsinger, MD 12/18/13 [phone]

## 2013-12-18 NOTE — ED Notes (Signed)
C/o pain general abdomin since Sunday, took a laxative, and had loose BM's since then. now pain mostly RUQ and into back

## 2013-12-18 NOTE — Progress Notes (Signed)
  Pt admitted to the unit. Pt is stable, alert and oriented per baseline. Oriented to room, staff, and call bell. Educated to call for any assistance. Bed in lowest position, call bell within reach- will continue to monitor. 

## 2013-12-18 NOTE — ED Notes (Signed)
Pt transported to CT ?

## 2013-12-18 NOTE — ED Provider Notes (Signed)
CSN: [id number]     Arrival date & time 12/18/13  1121 History   First MD Initiated Contact with Patient 12/18/13 1136     Chief Complaint  Patient presents with  . Abdominal Pain   (Consider location/radiation/quality/duration/timing/severity/associated sxs/prior Treatment) HPI John Delacruz is a 53 y.o. male who presents to emergency department complaining of right upper quadrant abdominal pain. Patient states that his pain began 3 days ago. He does not have any associated nausea or vomiting. There is no fever. He states he has had some loose bowel movements with no blood in his stool. He has not tried taking any medications for this. He states today pain worsened so he went to urgent care and was sent here for further evaluation. Patient states that he has not had anything T. the last 2 days because he was worried that this will worsen the pain. He states he has been drinking fluids and this does not affect his pain. He states pain is worse with standing there, better with laying down. Nothing else triggers were made his pain worse. He denies any prior abdominal pain, no surgeries over his abdomen. He states he does drink alcohol daily, states drinks about 3 beers a day every day.  Past Medical History  Diagnosis Date  . Hypertension    Past Surgical History  Procedure Laterality Date  . Hernia repair    . Fracture surgery     Family History  Problem Relation Age of Onset  . Hypertension Mother   . Kidney disease Mother   . Lupus Sister    History  Substance Use Topics  . Smoking status: Current Every Day Smoker -- 0.80 packs/day    Types: Cigarettes  . Smokeless tobacco: Not on file  . Alcohol Use: 0.0 oz/week    2-3 Cans of beer per week    Review of Systems  Constitutional: Negative for fever and chills.  Respiratory: Negative for cough, chest tightness and shortness of breath.   Cardiovascular: Negative for chest pain, palpitations and leg swelling.  Gastrointestinal:  Positive for abdominal pain and diarrhea. Negative for nausea, vomiting, blood in stool and abdominal distention.  Genitourinary: Negative for dysuria and flank pain.  Musculoskeletal: Negative for arthralgias, myalgias, neck pain and neck stiffness.  Skin: Negative for rash.  Allergic/Immunologic: Negative for immunocompromised state.  Neurological: Negative for dizziness, weakness, light-headedness, numbness and headaches.    Allergies  Review of patient's allergies indicates no known allergies.  Home Medications   Current Outpatient Rx  Name  Route  Sig  Dispense  Refill  . diltiazem (CARDIZEM LA) 120 MG 24 hr tablet   Oral   Take 240 mg by mouth daily.         Marland Kitchen HYDROcodone-acetaminophen (NORCO/VICODIN) 5-325 MG per tablet   Oral   Take 1 tablet by mouth every 6 (six) hours as needed for pain.   30 tablet   0   . ibuprofen (ADVIL,MOTRIN) 800 MG tablet   Oral   Take 1 tablet (800 mg total) by mouth every 8 (eight) hours as needed for pain.   30 tablet   0   . methocarbamol (ROBAXIN-750) 750 MG tablet   Oral   Take 1 tablet (750 mg total) by mouth 4 (four) times daily.   40 tablet   1    BP 210/124  Pulse 66  Temp(Src) 97.4 F (36.3 C) (Oral)  Resp 15  Ht 5\' 9"  (1.753 m)  Wt 205 lb (92.987 kg)  BMI 30.26 kg/m2  SpO2 98% Physical Exam  Nursing note and vitals reviewed. Constitutional: He appears well-developed and well-nourished. No distress.  HENT:  Head: Normocephalic and atraumatic.  Eyes: Conjunctivae are normal.  Neck: Neck supple.  Cardiovascular: Normal rate, regular rhythm and normal heart sounds.   Pulmonary/Chest: Effort normal. No respiratory distress. He has no wheezes. He has no rales.  Abdominal: Soft. Bowel sounds are normal. He exhibits no distension. There is tenderness. There is no rebound.  Right upper quadrant tenderness. No guarding.  Musculoskeletal: He exhibits no edema.  Neurological: He is alert.  Skin: Skin is warm and dry.     ED Course  Procedures (including critical care time) Labs Review Labs Reviewed  CBC WITH DIFFERENTIAL - Abnormal; Notable for the following:    WBC 11.0 (*)    Neutro Abs 8.3 (*)    Monocytes Absolute 1.2 (*)    All other components within normal limits  COMPREHENSIVE METABOLIC PANEL - Abnormal; Notable for the following:    Potassium 3.4 (*)    Glucose, Bld 113 (*)    Creatinine, Ser 1.64 (*)    GFR calc non Af Amer 46 (*)    GFR calc Af Amer 53 (*)    All other components within normal limits  LIPASE, BLOOD   Imaging Review [place] Abdomen Complete  12/18/2013   CLINICAL DATA:  Abdominal pain  EXAM: ULTRASOUND ABDOMEN COMPLETE  COMPARISON:  None.  FINDINGS: Gallbladder:  No gallstones or wall thickening visualized. No sonographic Murphy sign noted.  Common bile duct:  Diameter: 5.2 mm which is within normal limits  Liver:  The background hepatic parenchyma is echogenic and coarsened in appearance. However, there are several geographic and somewhat ill-defined regions of relative hypo echogenicity in the left hepatic lobe and adjacent to the gallbladder fossa. The larger focus in the left hepatic lobe measures 3 x 5 x 3 cm wall a smaller focus abutting the gallbladder fossa measures 1.7 x 1.2 x 1.2 cm. No evidence of abnormal vascularity within the lesions.  IVC:  No abnormality visualized.  Pancreas:  Visualized portion unremarkable.  Spleen:  Size and appearance within normal limits.  Right Kidney:  Length: 9.7 cm. Echogenicity within normal limits. No solid mass or hydronephrosis visualized. Small 1.2 x 0.8 x 0.9 cm hypoechoic simple cyst in the lower pole of the kidney.  Left Kidney:  Length: 11.2 cm. Echogenicity within normal limits. No mass or hydronephrosis visualized.  Abdominal aorta:  No aneurysm visualized.  Other findings:  None.  IMPRESSION: 1. No acute abnormality. Specifically, no evidence of cholelithiasis or cholecystitis. 2. Relatively increased echogenicity of the majority  of the liver concerning for hepatic steatosis. 3. Two geographic areas of relative hypo echogenicity in the hepatic parenchyma in the left hepatic lobe, and adjacent to the gallbladder fossa are strongly suspected to represent areas of focal fatty sparing. Recommend follow-up abdominal ultrasound in 3 months to assess stability. If more definitive imaging confirmation is desired, MRI of the abdomen with and without contrast would be diagnostic.   Electronically Signed   By: Malachy MoanHeath  McCullough M.D.   On: 12/18/2013 13:45   Ct Abdomen Pelvis W Contrast  12/18/2013   CLINICAL DATA:  Abdominal pain.  EXAM: CT ABDOMEN AND PELVIS WITH CONTRAST  TECHNIQUE: Multidetector CT imaging of the abdomen and pelvis was performed using the standard protocol following bolus administration of intravenous contrast.  CONTRAST:  80mL OMNIPAQUE IOHEXOL 300 MG/ML  SOLN  COMPARISON:  Ultrasound dated  12/18/2013  FINDINGS: There is focal diverticulitis of the ascending colon just below the hepatic flexure with pericolonic soft tissue stranding but without a diverticular abscess or perforation. This is just below the gallbladder.  There is slight inhomogeneity of the liver parenchyma in the dome of the right lobe, probably representing focal hepatic steatosis. There is a similar area in the left lobe. This is quite subtle.  Biliary tree, spleen, pancreas, adrenal glands, and kidneys are normal. Terminal ileum and appendix are normal. There are few small diverticula in the distal colon. No free air or free fluid.  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  IMPRESSION: Focal diverticulitis of the ascending colon just below the hepatic flexure.   Electronically Signed   By: Geanie Cooley M.D.   On: 12/18/2013 16:22    EKG Interpretation   None       MDM   1. HTN (hypertension)   2. Diverticulitis     12:09 PM Patient in emergency department with right upper quadrant abdominal pain. No history of  similar pain in the past. He is a drinker, average is about 3 drinks a day. History of hypertension. Blood pressure is 200 systolic over 100s diastolic here. Patient admits to not taking his Cardizem over last several days. He states he has a just has not taken it. I will order his regular dose of Cardizem here in emergency department. Suspect elevated blood pressure also could be do to pain. Patient at this time does not want any pain medications. I will obtain blood work, and ultrasound to rule out cholecystitis.  6:36 PM [place] negative. CT showed Diverticulitis. Pt's BP still elevated after 2 doses of hydralazine 5mg , labetalol 5mg  all IV and hsi regular cardizem dose. Creatinine slightly elevated. Will need admission for BP control. Discussed with patient, hesitant but agreed.   ECG done at UC, negative. Trop ordered.   Spoke with [facility], will admit.   Medications  metroNIDAZOLE (FLAGYL) IVPB 500 mg (not administered)  ciprofloxacin (CIPRO) IVPB 400 mg (not administered)  diltiazem (CARDIZEM CD) 24 hr capsule 240 mg (240 mg Oral Given 12/18/13 1216)  hydrALAZINE (APRESOLINE) injection 5 mg (5 mg Intravenous Given 12/18/13 1449)  iohexol (OMNIPAQUE) 300 MG/ML solution 25 mL (25 mLs Oral Contrast Given 12/18/13 1525)  iohexol (OMNIPAQUE) 300 MG/ML solution 80 mL (80 mLs Intravenous Contrast Given 12/18/13 1601)  hydrALAZINE (APRESOLINE) injection 5 mg (5 mg Intravenous Given 12/18/13 1659)  sodium chloride 0.9 % bolus 1,000 mL (1,000 mLs Intravenous New Bag/Given 12/18/13 1746)  labetalol (NORMODYNE,TRANDATE) injection 5 mg (5 mg Intravenous Given 12/18/13 1747)   Filed Vitals:   12/18/13 1640 12/18/13 1715 12/18/13 1745 12/18/13 1800  BP: 217/109 222/114 220/107 207/107  Pulse: 63 66 64 62  Temp: 98.2 F (36.8 C)     TempSrc: Oral     Resp: 18     Height:      Weight:      SpO2: 100% 97% 98% 98%     Lottie Mussel, PA-C 12/18/13 1838

## 2013-12-18 NOTE — Discharge Instructions (Signed)
We have determined that your problem requires further evaluation in the emergency department.  We will take care of your transport there.  Once at the emergency department, you will be evaluated by a provider and they will order whatever treatment or tests they deem necessary.  We cannot guarantee that they will do any specific test or do any specific treatment.  ° °

## 2013-12-19 LAB — COMPREHENSIVE METABOLIC PANEL
ALT: 15 U/L (ref 0–53)
AST: 17 U/L (ref 0–37)
Albumin: 3.3 g/dL — ABNORMAL LOW (ref 3.5–5.2)
Alkaline Phosphatase: 77 U/L (ref 39–117)
BUN: 16 mg/dL (ref 6–23)
CO2: 23 mEq/L (ref 19–32)
Calcium: 9.1 mg/dL (ref 8.4–10.5)
Chloride: 102 mEq/L (ref 96–112)
Creatinine, Ser: 1.49 mg/dL — ABNORMAL HIGH (ref 0.50–1.35)
GFR calc Af Amer: 60 mL/min — ABNORMAL LOW (ref >90)
GFR calc non Af Amer: 52 mL/min — ABNORMAL LOW (ref >90)
Glucose, Bld: 105 mg/dL — ABNORMAL HIGH (ref 70–99)
Potassium: 4.1 mEq/L (ref 3.7–5.3)
Sodium: 139 mEq/L (ref 137–147)
Total Bilirubin: 0.5 mg/dL (ref 0.3–1.2)
Total Protein: 7 g/dL (ref 6.0–8.3)

## 2013-12-19 LAB — CBC
HCT: 44.9 % (ref 39.0–52.0)
Hemoglobin: 15.8 g/dL (ref 13.0–17.0)
MCH: 29.5 pg (ref 26.0–34.0)
MCHC: 35.2 g/dL (ref 30.0–36.0)
MCV: 83.9 fL (ref 78.0–100.0)
Platelets: 194 10*3/uL (ref 150–400)
RBC: 5.35 MIL/uL (ref 4.22–5.81)
RDW: 14.2 % (ref 11.5–15.5)
WBC: 9.2 10*3/uL (ref 4.0–10.5)

## 2013-12-19 LAB — TROPONIN I
Troponin I: <0.3 ng/mL (ref <0.30)
Troponin I: <0.3 ng/mL (ref <0.30)

## 2013-12-19 MED ORDER — HYDROCODONE-ACETAMINOPHEN 5-325 MG PO TABS: 1.0000 | ORAL_TABLET | Freq: Four times a day (QID) | ORAL | Status: DC | PRN

## 2013-12-19 MED ORDER — DILTIAZEM HCL ER COATED BEADS 240 MG PO CP24: 240.0000 mg | ORAL_CAPSULE | Freq: Every day | ORAL | Status: DC

## 2013-12-19 MED ORDER — HYDROCHLOROTHIAZIDE 12.5 MG PO CAPS
12.5000 mg | ORAL_CAPSULE | Freq: Every day | ORAL | Status: DC
  Administered 2013-12-19: 12.5 mg via ORAL
  Filled 2013-12-19: qty 1

## 2013-12-19 MED ORDER — METRONIDAZOLE 500 MG PO TABS: 500.0000 mg | ORAL_TABLET | Freq: Three times a day (TID) | ORAL | Status: DC

## 2013-12-19 MED ORDER — CIPROFLOXACIN HCL 500 MG PO TABS: 500.0000 mg | ORAL_TABLET | Freq: Two times a day (BID) | ORAL | Status: DC

## 2013-12-19 MED ORDER — LISINOPRIL-HYDROCHLOROTHIAZIDE 10-12.5 MG PO TABS: 1.0000 | ORAL_TABLET | Freq: Every day | ORAL | Status: DC

## 2013-12-19 MED ORDER — HYDRALAZINE HCL 20 MG/ML IJ SOLN
10.0000 mg | Freq: Four times a day (QID) | INTRAMUSCULAR | Status: DC | PRN
  Administered 2013-12-19: 10 mg via INTRAVENOUS
  Filled 2013-12-19: qty 1

## 2013-12-19 NOTE — Progress Notes (Signed)
Utilization review completed. Jakolby Sedivy, RN, BSN. 

## 2013-12-19 NOTE — Discharge Summary (Signed)
PATIENT DETAILS Name: John Delacruz Age: 53 y.o. Sex: male Date of Birth: 1961/01/02 MRN: [id number]. Admit Date: 12/18/2013 Admitting Physician: Meredeth Ide, MD BJY:NWGNF,AOZHYQM J, MD  Recommendations for Outpatient Follow-up:  1. Optimize antihypertensive medications. 2. Counsel regarding the importance of compliance 3. Please refer to gastroenterology for a colonoscopy in the next 1-2 months.  PRIMARY DISCHARGE DIAGNOSIS:  Active Problems:   HTN (hypertension)   Diverticulitis      PAST MEDICAL HISTORY: Past Medical History  Diagnosis Date  . Hypertension     DISCHARGE MEDICATIONS:   Medication List    STOP taking these medications       diltiazem 120 MG 24 hr tablet  Commonly known as:  CARDIZEM LA  Replaced by:  diltiazem 240 MG 24 hr capsule      TAKE these medications       ciprofloxacin 500 MG tablet  Commonly known as:  CIPRO  Take 1 tablet (500 mg total) by mouth 2 (two) times daily.     diltiazem 240 MG 24 hr capsule  Commonly known as:  CARDIZEM CD  Take 1 capsule (240 mg total) by mouth daily.     HYDROcodone-acetaminophen 5-325 MG per tablet  Commonly known as:  NORCO/VICODIN  Take 1-2 tablets by mouth every 6 (six) hours as needed for moderate pain.     lisinopril-hydrochlorothiazide 10-12.5 MG per tablet  Commonly known as:  ZESTORETIC  Take 1 tablet by mouth daily.     metroNIDAZOLE 500 MG tablet  Commonly known as:  FLAGYL  Take 1 tablet (500 mg total) by mouth 3 (three) times daily.        ALLERGIES:  No Known Allergies  BRIEF HPI:  See H&P, Labs, Consult and Test reports for all details in brief, patient was admitted for right-sided abdominal pain, further evaluation in the emergency room with a CT scan of the abdomen showed right-sided diverticulitis. Patient was also found to have significantly elevated blood pressures, he gave a history of being noncompliant to diltiazem.  CONSULTATIONS:   None  PERTINENT RADIOLOGIC  STUDIES: US Abdomen Complete  12/18/2013   CLINICAL DATA:  Abdominal pain  EXAM: ULTRASOUND ABDOMEN COMPLETE  COMPARISON:  None.  FINDINGS: Gallbladder:  No gallstones or wall thickening visualized. No sonographic Murphy sign noted.  Common bile duct:  Diameter: 5.2 mm which is within normal limits  Liver:  The background hepatic parenchyma is echogenic and coarsened in appearance. However, there are several geographic and somewhat ill-defined regions of relative hypo echogenicity in the left hepatic lobe and adjacent to the gallbladder fossa. The larger focus in the left hepatic lobe measures 3 x 5 x 3 cm wall a smaller focus abutting the gallbladder fossa measures 1.7 x 1.2 x 1.2 cm. No evidence of abnormal vascularity within the lesions.  IVC:  No abnormality visualized.  Pancreas:  Visualized portion unremarkable.  Spleen:  Size and appearance within normal limits.  Right Kidney:  Length: 9.7 cm. Echogenicity within normal limits. No solid mass or hydronephrosis visualized. Small 1.2 x 0.8 x 0.9 cm hypoechoic simple cyst in the lower pole of the kidney.  Left Kidney:  Length: 11.2 cm. Echogenicity within normal limits. No mass or hydronephrosis visualized.  Abdominal aorta:  No aneurysm visualized.  Other findings:  None.  IMPRESSION: 1. No acute abnormality. Specifically, no evidence of cholelithiasis or cholecystitis. 2. Relatively increased echogenicity of the majority of the liver concerning for hepatic steatosis. 3. Two geographic areas of relative hypo  echogenicity in the hepatic parenchyma in the left hepatic lobe, and adjacent to the gallbladder fossa are strongly suspected to represent areas of focal fatty sparing. Recommend follow-up abdominal ultrasound in 3 months to assess stability. If more definitive imaging confirmation is desired, MRI of the abdomen with and without contrast would be diagnostic.   Electronically Signed   By: Malachy MoanHeath  McCullough M.D.   On: 12/18/2013 13:45   Ct Abdomen Pelvis W  Contrast  12/18/2013   CLINICAL DATA:  Abdominal pain.  EXAM: CT ABDOMEN AND PELVIS WITH CONTRAST  TECHNIQUE: Multidetector CT imaging of the abdomen and pelvis was performed using the standard protocol following bolus administration of intravenous contrast.  CONTRAST:  80mL OMNIPAQUE IOHEXOL 300 MG/ML  SOLN  COMPARISON:  Ultrasound dated 12/18/2013  FINDINGS: There is focal diverticulitis of the ascending colon just below the hepatic flexure with pericolonic soft tissue stranding but without a diverticular abscess or perforation. This is just below the gallbladder.  There is slight inhomogeneity of the liver parenchyma in the dome of the right lobe, probably representing focal hepatic steatosis. There is a similar area in the left lobe. This is quite subtle.  Biliary tree, spleen, pancreas, adrenal glands, and kidneys are normal. Terminal ileum and appendix are normal. There are few small diverticula in the distal colon. No free air or free fluid.  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  IMPRESSION: Focal diverticulitis of the ascending colon just below the hepatic flexure.   Electronically Signed   By: Geanie CooleyJim  Maxwell M.D.   On: 12/18/2013 16:22     PERTINENT LAB RESULTS: CBC:  Recent Labs  12/18/13 2137 12/19/13 0658  WBC 9.7 9.2  HGB 15.9 15.8  HCT 45.1 44.9  PLT 191 194   CMET CMP     Component Value Date/Time   NA 139 12/19/2013 0658   K 4.1 12/19/2013 0658   CL 102 12/19/2013 0658   CO2 23 12/19/2013 0658   GLUCOSE 105* 12/19/2013 0658   BUN 16 12/19/2013 0658   CREATININE 1.49* 12/19/2013 0658   CALCIUM 9.1 12/19/2013 0658   PROT 7.0 12/19/2013 0658   ALBUMIN 3.3* 12/19/2013 0658   AST 17 12/19/2013 0658   ALT 15 12/19/2013 0658   ALKPHOS 77 12/19/2013 0658   BILITOT 0.5 12/19/2013 0658   GFRNONAA 52* 12/19/2013 0658   GFRAA 60* 12/19/2013 0658    GFR Estimated Creatinine Clearance: 62 ml/min (by C-G formula based on Cr of 1.49).  Recent Labs   12/18/13 1147  LIPASE 29    Recent Labs  12/18/13 2137 12/19/13 0658 12/19/13 0944  TROPONINI <0.30 <0.30 <0.30   No components found with this basename: POCBNP,  No results found for this basename: DDIMER,  in the last 72 hours No results found for this basename: HGBA1C,  in the last 72 hours No results found for this basename: CHOL, HDL, LDLCALC, TRIG, CHOLHDL, LDLDIRECT,  in the last 72 hours No results found for this basename: TSH, T4TOTAL, FREET3, T3FREE, THYROIDAB,  in the last 72 hours No results found for this basename: VITAMINB12, FOLATE, FERRITIN, TIBC, IRON, RETICCTPCT,  in the last 72 hours Coags: No results found for this basename: PT, INR,  in the last 72 hours Microbiology: No results found for this or any previous visit (from the past 240 hour(s)).   BRIEF HOSPITAL COURSE:   Active Problems: Right-sided diverticulitis - Patient was seen in the emergency room, a CT scan of the abdomen  was positive for right-sided diverticulitis. Patient was admitted, kept n.p.o. and started on empiric Cipro and Flagyl. Patient made rapid clinical improvement, diet was quickly advanced, he has already tolerated to regular diet. I have asked the patient to make a followup appointment with his PCP, and then subsequently get a referral to gastroenterology for a colonoscopy once this acute diverticulitis is over, hopefully in the next few months.  Hypertensive urgency - Secondary to noncompliance. Cardizem was increased to 240 mg, lisinopril and HCTZ were added. Further optimization will be done in the outpatient setting by the PCP.  TODAY-DAY OF DISCHARGE:  Subjective:   Cyprian Gong[place] today has no headache,no chest abdominal pain,no new weakness tingling or numbness, feels much better wants to go home today.   Objective:   Blood pressure 164/95, pulse 93, temperature 98.4 F (36.9 C), temperature source Oral, resp. rate 18, height 5\' 9"  (1.753 m), weight 87.3 kg (192 lb 7.4 oz),  SpO2 98.00%.  Intake/Output Summary (Last 24 hours) at 12/19/13 1558 Last data filed at 12/18/13 2300  Gross per 24 hour  Intake      0 ml  Output      2 ml  Net     -2 ml   Filed Weights   12/18/13 1131 12/18/13 2110  Weight: 92.987 kg (205 lb) 87.3 kg (192 lb 7.4 oz)    Exam Awake Alert, Oriented *3, No new F.N deficits, Normal affect [place].AT,PERRAL Supple Neck,No JVD, No cervical lymphadenopathy appriciated.  Symmetrical Chest wall movement, Good air movement bilaterally, CTAB RRR,No Gallops,Rubs or new Murmurs, No Parasternal Heave +ve B.Sounds, Abd Soft, Non tender, No organomegaly appriciated, No rebound -guarding or rigidity. No Cyanosis, Clubbing or edema, No new Rash or bruise  DISCHARGE CONDITION: Stable  DISPOSITION: Home  DISCHARGE INSTRUCTIONS:    Activity:  As tolerated   Diet recommendation: Heart Healthy diet  Discharge Orders   Future Orders Complete By Expires   Diet - low sodium heart healthy  As directed    Scheduling Instructions:     Stay on full liquids for 1-2 days before advancing to a soft mechanical diet.   Increase activity slowly  As directed       Follow-up Information   Follow up with Shirlean Mylar, D, MD On 12/25/2013. (10:30)    Specialty:  Family Medicine   Contact information:   [address] [place] [address] [phone]       Total Time spent on discharge equals 45 minutes.  SignedJeoffrey Massed 12/19/2013 3:58 PM

## 2013-12-19 NOTE — Progress Notes (Signed)
Pt discharged home, pt a&ox4 at time of discharge. PIV discontinued. Discharge instructions provided, prescriptions given and explained, pt verbalized understanding. Pt denies any further needs at this time. Pt escorted to cafeteria, pt in no distress.

## 2013-12-19 NOTE — Progress Notes (Signed)
  Echocardiogram 2D Echocardiogram has been performed.  Cathie BeamsGREGORY, Mckinzey Entwistle 12/19/2013, 10:58 AM

## 2013-12-20 NOTE — ED Provider Notes (Signed)
Medical screening examination/treatment/procedure(s) were performed by non-physician practitioner and as supervising physician I was immediately available for consultation/collaboration.  Jantz T Clayson Riling, MD 12/20/13 0941 

## 2014-04-24 ENCOUNTER — Other Ambulatory Visit: Payer: Self-pay | Admitting: Family Medicine

## 2014-04-24 DIAGNOSIS — R109 Unspecified abdominal pain: Secondary | ICD-10-CM

## 2014-04-28 ENCOUNTER — Ambulatory Visit
Admission: RE | Admit: 2014-04-28 | Discharge: 2014-04-28 | Disposition: A | Discharge status: Home / Self Care | Payer: BC Managed Care – PPO | Source: Ambulatory Visit | Attending: Family Medicine | Admitting: Family Medicine

## 2014-04-28 DIAGNOSIS — R109 Unspecified abdominal pain: Secondary | ICD-10-CM

## 2014-08-04 ENCOUNTER — Ambulatory Visit (INDEPENDENT_AMBULATORY_CARE_PROVIDER_SITE_OTHER): Payer: BC Managed Care – PPO | Admitting: Emergency Medicine

## 2014-08-04 ENCOUNTER — Telehealth: Payer: Self-pay

## 2014-08-04 ENCOUNTER — Ambulatory Visit: Payer: BC Managed Care – PPO

## 2014-08-04 VITALS — BP 140/85 | HR 65 | Temp 97.7°F | Resp 16 | Ht 69.0 in | Wt 200.0 lb

## 2014-08-04 DIAGNOSIS — S6991XA Unspecified injury of right wrist, hand and finger(s), initial encounter: Secondary | ICD-10-CM

## 2014-08-04 DIAGNOSIS — S6980XA Other specified injuries of unspecified wrist, hand and finger(s), initial encounter: Secondary | ICD-10-CM

## 2014-08-04 DIAGNOSIS — S6990XA Unspecified injury of unspecified wrist, hand and finger(s), initial encounter: Secondary | ICD-10-CM

## 2014-08-04 NOTE — Progress Notes (Signed)
   Subjective:    Patient ID: John Delacruz, male    DOB: Apr 05, 1961, 53 y.o.   MRN: [id number]  HPI Patient fell about 3 weeks ago and broke his fall with his right hand. He has had swelling that has only mildly improved since the fall. He felt that he had dislocated it and he put it back into place. He does not have any pain unless he bumps it on something. He is concerned that it continues to swell.   He has a PCP who follows him for HTN and regular check ups. He does not wish to have a flu shot today.   Review of Systems No numbness, no tingling, no weakness    Objective:   Physical Exam  Vitals reviewed. Constitutional: He is oriented to person, place, and time. He appears well-developed and well-nourished. No distress.  HENT:  Head: Normocephalic and atraumatic.  Eyes: Conjunctivae are normal.  Cardiovascular: Normal rate.   Pulmonary/Chest: Effort normal.  Musculoskeletal: Normal range of motion.  Right ring finger with moderate swelling over PIP. No tenderness to palpation, ROM limited by swelling, full strength, brisk cap refill, no erythema or ecchymosis.  Neurological: He is alert and oriented to person, place, and time.  Skin: Skin is warm and dry. He is not diaphoretic.  Psychiatric: He has a normal mood and affect. His behavior is normal. Judgment and thought content normal.  [facility] reading (PRIMARY) by  Dr. Dareen Piano- suspicious for fracture 4th digit medial proximal phalanx. Soft tissue swelling.     Assessment & Plan:  1. Injury of right ring finger, initial encounter - DG Finger Ring Right; Future -Provided written and verbal information regarding diagnosis and treatment. -offered to buddy tape, but patient preferred fold over splint, so splint applied. Patient instructed to remove several times throughout the day and do gentle ROM -f/u PRN  Emi Belfast, FNP-BC  [facility], [facility]  08/04/2014 5:21 PM

## 2014-08-04 NOTE — Telephone Encounter (Signed)
Pt would

## 2015-01-26 ENCOUNTER — Encounter (HOSPITAL_COMMUNITY): Payer: Self-pay | Admitting: Emergency Medicine

## 2015-01-26 ENCOUNTER — Emergency Department (HOSPITAL_COMMUNITY)
Admission: EM | Admit: 2015-01-26 | Discharge: 2015-01-26 | Disposition: A | Discharge status: Home / Self Care | Payer: BLUE CROSS/BLUE SHIELD | Source: Home / Self Care | Attending: Emergency Medicine | Admitting: Emergency Medicine

## 2015-01-26 DIAGNOSIS — M545 Low back pain, unspecified: Secondary | ICD-10-CM

## 2015-01-26 LAB — POCT URINALYSIS DIP (DEVICE)
Bilirubin Urine: NEGATIVE
Glucose, UA: NEGATIVE mg/dL
Hgb urine dipstick: NEGATIVE
Ketones, ur: NEGATIVE mg/dL
Leukocytes, UA: NEGATIVE
Nitrite: NEGATIVE
Protein, ur: NEGATIVE mg/dL
Specific Gravity, Urine: <=1.005 (ref 1.005–1.030)
Urobilinogen, UA: 0.2 mg/dL (ref 0.0–1.0)
pH: 5.5 (ref 5.0–8.0)

## 2015-01-26 MED ORDER — TIZANIDINE HCL 4 MG PO TABS: 4.0000 mg | ORAL_TABLET | Freq: Four times a day (QID) | ORAL | Status: DC | PRN

## 2015-01-26 MED ORDER — SALSALATE 750 MG PO TABS: 1500.0000 mg | ORAL_TABLET | Freq: Two times a day (BID) | ORAL | Status: DC

## 2015-01-26 MED ORDER — HYDROCODONE-ACETAMINOPHEN 5-325 MG PO TABS: ORAL_TABLET | ORAL | Status: DC

## 2015-01-26 MED ORDER — CIPROFLOXACIN HCL 500 MG PO TABS: 500.0000 mg | ORAL_TABLET | Freq: Two times a day (BID) | ORAL | Status: DC

## 2015-01-26 NOTE — ED Notes (Signed)
Patient c/o lower back pain x 5 days. Patient reports its worse in the morning. Patient reports he took ibuprofen with relief. Also has had frequent urination recently. Patient is in NAD.

## 2015-01-26 NOTE — Discharge Instructions (Signed)
Do exercises twice daily followed by moist heat for 15 minutes. ° ° ° ° ° °Try to be as active as possible. ° °If no better in 2 weeks, follow up with orthopedist. ° ° °

## 2015-01-26 NOTE — ED Provider Notes (Signed)
Chief Complaint   Back Pain    History of Present Illness   John Delacruz is a 54 year old male who has had a six-day history of lower back pain that began the right and radiated towards the center. He denies any injury. The pain is worse if he gets up from a lying position but also with bending, lifting, twisting. It's better with ibuprofen. He has had some frequent urination and some urgency. He has a history of urinary tract infections. Denies any dysuria or hematuria. No fever, chills, nausea, or vomiting. There is no history of cancer or major trauma. No osteoporosis. No immunosuppression her HIV infection. He has no numbness or tingling in lower extremities, muscle weakness, bladder or bowel dysfunction, saddle anesthesia, nausea, vomiting, syncope, presyncope, fever, chills, or weight loss.  Review of Systems   Other than as noted above, the patient denies any of the following symptoms: Systemic:  No fever, chills, or unexplained weight loss. GI:  No abdominal pain or incontinence of bowel. GU:  No dysuria, frequency, urgency, or hematuria. No incontinence of urine or urinary retention.  M-S:  No neck pain or arthritis. Neuro:  No paresthesias, headache, saddle anesthesia, muscular weakness, or progressive neurological deficit.  PMFSH   Past medical history, family history, social history, meds, and allergies were reviewed. He has high blood pressure and takes HCTZ.  Physical Examination    Vital signs:  BP 210/110 mmHg  Pulse 64  Temp(Src) 98.4 F (36.9 C) (Oral)  Resp 16  SpO2 99% General:  Alert, oriented, in no distress. Abdomen:  Soft, non-tender.  No organomegaly or mass.  No pulsatile midline abdominal mass or bruit. Back:  There is tenderness to palpation in the lower lumbar spine just above the iliac crest, mostly on the right but some on the left as well. The back has a very limited range of motion with 45 of flexion, 10 of extension, 20 of lateral bending, and  45 of rotation with pain. Straight leg raising produces lower back pain bilaterally, worse in the right than the left, but no pain radiating down the legs. Neuro:  Normal muscle strength, sensations and DTRs. Extremities: Pedal pulses were full, there was no edema. Skin:  Clear, warm and dry.  No rash.  Labs   Results for orders placed or performed during the hospital encounter of 01/26/15  POCT urinalysis dip (device)  Result Value Ref Range   Glucose, UA NEGATIVE NEGATIVE mg/dL   Bilirubin Urine NEGATIVE NEGATIVE   Ketones, ur NEGATIVE NEGATIVE mg/dL   Specific Gravity, Urine <=1.005 1.005 - 1.030   Hgb urine dipstick NEGATIVE NEGATIVE   pH 5.5 5.0 - 8.0   Protein, ur NEGATIVE NEGATIVE mg/dL   Urobilinogen, UA 0.2 0.0 - 1.0 mg/dL   Nitrite NEGATIVE NEGATIVE   Leukocytes, UA NEGATIVE NEGATIVE    Assessment   The encounter diagnosis was Bilateral low back pain without sciatica.  No evidence of cauda equina syndrome, discitis, epidural abscess, fracture, acute pyelonephritis, bleed, cancer, or aneurism.  I doubt that the pain is due to urinary tract infection, but a urine culture has been obtained and he was started on Cipro. He was urged to follow-up with regard to his elevated blood pressure.  Plan     1.  Meds:  The following meds were prescribed:   Discharge Medication List as of 01/26/2015  1:08 PM    START taking these medications   Details  ciprofloxacin (CIPRO) 500 MG tablet Take 1 tablet (  500 mg total) by mouth every 12 (twelve) hours., Starting 01/26/2015, Until Discontinued, Normal    HYDROcodone-acetaminophen (NORCO/VICODIN) 5-325 MG per tablet 1 to 2 tabs every 4 to 6 hours as needed for pain., Print    salsalate (DISALCID) 750 MG tablet Take 2 tablets (1,500 mg total) by mouth 2 (two) times daily., Starting 01/26/2015, Until Discontinued, Normal    tiZANidine (ZANAFLEX) 4 MG tablet Take 1 tablet (4 mg total) by mouth every 6 (six) hours as needed for muscle spasms.,  Starting 01/26/2015, Until Discontinued, Normal        2.  Patient Education/Counseling:  The patient was given appropriate handouts, self care instructions, and instructed in symptomatic relief. The patient was encouraged to try to be as active as possible and given some exercises to do followed by moist heat.  3.  Follow up:  The patient was told to follow up here if no better in 3 to 4 days, or sooner if becoming worse in any way, and given some red flag symptoms such as worsening pain or new neurological symptoms which would prompt immediate return.  Follow up with orthopedics within the next week.     Reuben Likesavid C Addelyn Alleman, MD 01/26/15 [phone]

## 2015-01-27 LAB — URINE CULTURE
Colony Count: NO GROWTH
Culture: NO GROWTH
Special Requests: NORMAL

## 2015-01-28 NOTE — ED Notes (Signed)
Final report of UA culture negative . No further action required 

## 2015-02-04 ENCOUNTER — Telehealth (HOSPITAL_COMMUNITY): Payer: Self-pay | Admitting: *Deleted

## 2015-02-04 NOTE — ED Notes (Signed)
Pt. called for lab result.  I called pt. Pt. verified x 2 and given neg. Result. Vassie MoselleYork, Jacyln Carmer M 02/04/2015

## 2015-07-05 ENCOUNTER — Encounter (HOSPITAL_COMMUNITY): Payer: Self-pay | Admitting: Oncology

## 2015-07-05 ENCOUNTER — Emergency Department (HOSPITAL_COMMUNITY)
Admission: EM | Admit: 2015-07-05 | Discharge: 2015-07-05 | Disposition: A | Discharge status: Home / Self Care | Payer: BLUE CROSS/BLUE SHIELD | Attending: Emergency Medicine | Admitting: Emergency Medicine

## 2015-07-05 DIAGNOSIS — X58XXXA Exposure to other specified factors, initial encounter: Secondary | ICD-10-CM | POA: Insufficient documentation

## 2015-07-05 DIAGNOSIS — Y998 Other external cause status: Secondary | ICD-10-CM | POA: Diagnosis not present

## 2015-07-05 DIAGNOSIS — Y9289 Other specified places as the place of occurrence of the external cause: Secondary | ICD-10-CM | POA: Insufficient documentation

## 2015-07-05 DIAGNOSIS — Y9389 Activity, other specified: Secondary | ICD-10-CM | POA: Diagnosis not present

## 2015-07-05 DIAGNOSIS — H11152 Pinguecula, left eye: Secondary | ICD-10-CM | POA: Insufficient documentation

## 2015-07-05 DIAGNOSIS — T1512XA Foreign body in conjunctival sac, left eye, initial encounter: Secondary | ICD-10-CM | POA: Diagnosis present

## 2015-07-05 DIAGNOSIS — Z79899 Other long term (current) drug therapy: Secondary | ICD-10-CM | POA: Diagnosis not present

## 2015-07-05 DIAGNOSIS — I10 Essential (primary) hypertension: Secondary | ICD-10-CM | POA: Insufficient documentation

## 2015-07-05 DIAGNOSIS — Z720 Tobacco use: Secondary | ICD-10-CM | POA: Diagnosis not present

## 2015-07-05 MED ORDER — FLUORESCEIN SODIUM 1 MG OP STRP
1.0000 | ORAL_STRIP | Freq: Once | OPHTHALMIC | Status: AC
  Administered 2015-07-05: 1 via OPHTHALMIC
  Filled 2015-07-05: qty 1

## 2015-07-05 MED ORDER — TETRACAINE HCL 0.5 % OP SOLN
1.0000 [drp] | Freq: Once | OPHTHALMIC | Status: AC
  Administered 2015-07-05: 2 [drp] via OPHTHALMIC
  Filled 2015-07-05: qty 2

## 2015-07-05 MED ORDER — ARTIFICIAL TEARS OP OINT
TOPICAL_OINTMENT | Freq: Once | OPHTHALMIC | Status: AC
  Administered 2015-07-05: 07:00:00 via OPHTHALMIC
  Filled 2015-07-05: qty 3.5

## 2015-07-05 MED ORDER — KETOROLAC TROMETHAMINE 0.5 % OP SOLN
1.0000 [drp] | OPHTHALMIC | Status: AC
  Administered 2015-07-05: 1 [drp] via OPHTHALMIC
  Filled 2015-07-05: qty 3

## 2015-07-05 NOTE — Discharge Instructions (Signed)
Spoke with Dr. Randon Goldsmith who would like you to use artificial tears 3-6 times a day.  Lacri-Lube as needed for lubrication.  Cool compresses, over-the-counter ibuprofen on a regular basis 3-5 times a day and you've been supplied with Toradol, lack drops 1 drop to left eye twice a day only.  Please call first thing on Monday morning to set an appointment to be seen Monday or Tuesday their office opens at 8:30 AM on Monday

## 2015-07-05 NOTE — ED Provider Notes (Signed)
CSN: [id number]     Arrival date & time 07/05/15  0335 History   First MD Initiated Contact with Patient 07/05/15 0354     Chief Complaint  Patient presents with  . Foreign Body in Eye     (Consider location/radiation/quality/duration/timing/severity/associated sxs/prior Treatment) HPI  Past Medical History  Diagnosis Date  . Hypertension    Past Surgical History  Procedure Laterality Date  . Hernia repair    . Fracture surgery     Family History  Problem Relation Age of Onset  . Hypertension Mother   . Kidney disease Mother   . Lupus Sister    Social History  Substance Use Topics  . Smoking status: Current Every Day Smoker -- 0.80 packs/day    Types: Cigarettes  . Smokeless tobacco: None  . Alcohol Use: 0.0 oz/week    2-3 Cans of beer per week    Review of Systems    Allergies  Review of patient's allergies indicates no known allergies.  Home Medications   Prior to Admission medications   Medication Sig Start Date End Date Taking? Authorizing Provider  amLODipine (NORVASC) 10 MG tablet Take 10 mg by mouth daily.   Yes Historical Provider, MD  hydrochlorothiazide (MICROZIDE) 12.5 MG capsule Take 12.5 mg by mouth daily.   Yes Historical Provider, MD  ibuprofen (ADVIL,MOTRIN) 200 MG tablet Take 400 mg by mouth every 6 (six) hours as needed for moderate pain.   Yes Historical Provider, MD  ciprofloxacin (CIPRO) 500 MG tablet Take 1 tablet (500 mg total) by mouth every 12 (twelve) hours. Patient not taking: Reported on 07/05/2015 01/26/15   Reuben Likes, MD  HYDROcodone-acetaminophen (NORCO/VICODIN) 5-325 MG per tablet 1 to 2 tabs every 4 to 6 hours as needed for pain. Patient not taking: Reported on 07/05/2015 01/26/15   Reuben Likes, MD  salsalate (DISALCID) 750 MG tablet Take 2 tablets (1,500 mg total) by mouth 2 (two) times daily. Patient not taking: Reported on 07/05/2015 01/26/15   Reuben Likes, MD  tiZANidine (ZANAFLEX) 4 MG tablet Take 1 tablet (4 mg total) by  mouth every 6 (six) hours as needed for muscle spasms. Patient not taking: Reported on 07/05/2015 01/26/15   Reuben Likes, MD   BP 162/100 mmHg  Pulse 72  Temp(Src) 97.5 F (36.4 C) (Oral)  Resp 18  Ht  (1.753 m)  Wt 205 lb (92.987 kg)  BMI 30.26 kg/m2  SpO2 100% Physical Exam  Constitutional: He is oriented to person, place, and time.  Eyes: Conjunctivae and EOM are normal. Pupils are equal, round, and reactive to light. Left eye exhibits no discharge and no exudate. No foreign body present in the left eye. Left conjunctiva is not injected. Left conjunctiva has no hemorrhage. Left eye exhibits no nystagmus.  Slit lamp exam:      The left eye shows no fluorescein uptake and no anterior chamber bulge.    Neck: Normal range of motion.  Cardiovascular: Normal rate.   Pulmonary/Chest: Effort normal.  Neurological: He is alert and oriented to person, place, and time.  Skin: Skin is warm and dry.    ED Course  Procedures (including critical care time) Labs Review Labs Reviewed - No data to display  Imaging Review No results found. I, Jaylee Lantry K, personally reviewed and evaluated these images and lab results as part of my medical decision-making.   EKG Interpretation None     I spoke with Dr. Randon Goldsmith who would like pictures placed in  the chart of the lesions.  He will call me back with recommendations Dr. Randon Goldsmith called back.  He recommends artificial tears.  3.-  6 times a day, ketorolac drops twice a day and Lacri-Lube,  cool compresses, and anti-inflammatory by mouth.  He would like to see the patient in 1-2 days MDM   Final diagnoses:  Pinguecula of left eye              Earley Favor, NP 07/05/15 1610  Derwood Kaplan, MD 07/05/15 2219

## 2015-07-05 NOTE — ED Notes (Addendum)
Pt presents d/t foreign object in his left eye.  Pt rates pain 7/10 burning in nature.  Pt attempted to wash out eye however was unsuccessful.

## 2015-08-18 NOTE — ED Provider Notes (Signed)
CSN: [id number]     Arrival date & time 07/05/15  0335 History   First MD Initiated Contact with Patient 07/05/15 0354     Chief Complaint  Patient presents with  . Foreign Body in Eye     (Consider location/radiation/quality/duration/timing/severity/associated sxs/prior Treatment) HPI Comments: Chief Complaint   Foreign Body in Eye   ED Vitals   Date and Time   Temp   Pulse   Resp   BP   SpO2   O2 Flow Rate (L/min)   Weight Who  07/05/15 0607  --  72  18  162/100 mmHg  100 %  --  -- ACM  07/05/15 0345  --  --  --  170/100 mmHg  --  --  -- KEW  07/05/15 0342  97.5 F (36.4 C)  81  20   188/106 mmHg  97 %  --  205 lb (92.987 kg) KEW  ED Notes, ED Provider Notes    Earley Favor, NP 07/05/2015 06:12   Attestation signed by Derwood Kaplan, MD at 07/05/2015 10:19 PM Medical screening examination/treatment/procedure(s) were conducted as a shared visit with non-physician practitioner(s) and myself. I personally evaluated the patient during the encounter.   EKG Interpretation None    Pt with sudden onset eye pain. No abrasion or ulcers per PA-C exam and pt denies any vision complains. He has these creamish white lesions - 3'o clock and 9'o clock outside of the pupils. Optho called- As we are wondering if these are herpetic lesions.   Expand All Collapse All    CSN: [id number] Arrival date & time 07/05/15 0335  History First MD Initiated Contact with Patient 07/05/15 0354    Chief Complaint Patient presents with . Foreign Body in Eye    (Consider location/radiation/quality/duration/timing/severity/associated sxs/prior Treatment) HPI   Past Medical History Diagnosis Date . Hypertension    Past Surgical History Procedure Laterality Date . Hernia repair   . Fracture surgery     Family History Problem Relation Age of Onset . Hypertension Mother  . Kidney  disease Mother  . Lupus Sister    Social History Substance Use Topics . Smoking status: Current Every Day Smoker -- 0.80 packs/day   Types: Cigarettes . Smokeless tobacco: None . Alcohol Use: 0.0 oz/week   2-3 Cans of beer per week   Review of Systems     Allergies Review of patient's allergies indicates no known allergies.   Home Medications   Prior to Admission medications  Medication Sig Start Date End Date Taking? Authorizing Provider amLODipine (NORVASC) 10 MG tablet Take 10 mg by mouth daily.   Yes Historical Provider, MD hydrochlorothiazide (MICROZIDE) 12.5 MG capsule Take 12.5 mg by mouth daily.   Yes Historical Provider, MD ibuprofen (ADVIL,MOTRIN) 200 MG tablet Take 400 mg by mouth every 6 (six) hours as needed for moderate pain.   Yes Historical Provider, MD ciprofloxacin (CIPRO) 500 MG tablet Take 1 tablet (500 mg total) by mouth every 12 (twelve) hours. Patient not taking: Reported on 07/05/2015 01/26/15   Reuben Likes, MD HYDROcodone-acetaminophen (NORCO/VICODIN) 5-325 MG per tablet 1 to 2 tabs every 4 to 6 hours as needed for pain. Patient not taking: Reported on 07/05/2015 01/26/15   Reuben Likes, MD salsalate (DISALCID) 750 MG tablet Take 2 tablets (1,500 mg total) by mouth 2 (two) times daily. Patient not taking: Reported on 07/05/2015 01/26/15   Reuben Likes, MD tiZANidine (ZANAFLEX) 4 MG tablet Take 1 tablet (4 mg total) by mouth every  6 (six) hours as needed for muscle spasms. Patient not taking: Reported on 07/05/2015 01/26/15   Reuben Likes, MD  BP 162/100 mmHg  Pulse 72  Temp(Src) 97.5 F (36.4 C) (Oral)  Resp 18  Ht  (1.753 m)  Wt 205 lb (92.987 kg)  BMI 30.26 kg/m2  SpO2 100% Physical Exam  Constitutional: He is oriented to person, place, and time.  Eyes: Conjunctivae and EOM are normal. Pupils are equal, round, and reactive to light. Left eye exhibits no  discharge and no exudate. No foreign body present in the left eye. Left conjunctiva is not injected. Left conjunctiva has no hemorrhage. Left eye exhibits no nystagmus.  Slit lamp exam:  The left eye shows no fluorescein uptake and no anterior chamber bulge.    Neck: Normal range of motion.  Cardiovascular: Normal rate.  Pulmonary/Chest: Effort normal.  Neurological: He is alert and oriented to person, place, and time.  Skin: Skin is warm and dry.     ED Course Procedures (including critical care time) Labs Review Labs Reviewed - No data to display  Imaging Review   Imaging Results (Last 48 hours)   No results found.  I, Kazuko Clemence K, personally reviewed and evaluated these images and lab results as part of my medical decision-making.   EKG Interpretation None   I spoke with Dr. Randon Goldsmith who would like pictures placed in the chart of the lesions. He will call me back with recommendations Dr. Randon Goldsmith called back. He recommends artificial tears. 3.- 6 times a day, ketorolac drops twice a day and Lacri-Lube, cool compresses, and anti-inflammatory by mouth. He would like to see the patient in 1-2 days  MDM   Final diagnoses: Pinguecula of left eye             Earley Favor, NP 07/05/15 1610  Derwood Kaplan, MD 07/05/15 2219    Cosigned by Derwood Kaplan, MD at 07/05/2015 22:19     Fonda Kinder Ward, RN 07/05/2015 03:45  Expand All Collapse All    Pt presents d/t foreign object in his left eye. Pt rates pain 7/10 burning in nature. Pt attempted to wash out eye however was unsuccessful.    Original note by Hedwig Morton, RN at 07/05/2015 03:42     Fonda Kinder Ward, RN 07/05/2015 03:42  Expand All Collapse All    Pt presents d/t foreign object in his left eye. Pt rates pain 7/10 burning in nature.    Addendum to note by Hedwig Morton, RN at 07/05/2015 03:45    Home Medications   Medication Sig Dispense Doc.  Provider  amLODipine (NORVASC) 10 MG tablet Take 10 mg by mouth daily.  Historical Provider, MD  hydrochlorothiazide (MICROZIDE) 12.5 MG capsule Take 12.5 mg by mouth daily.  Historical Provider, MD  ibuprofen (ADVIL,MOTRIN) 200 MG tablet Take 400 mg by mouth every 6 (six) hours as needed for moderate pain.  Historical Provider, MD  ciprofloxacin (CIPRO) 500 MG tablet Take 1 tablet (500 mg total) by mouth every 12 (twelve) hours.  Patient not taking:  Reported on 07/05/2015 20 tablet Reuben Likes, MD  HYDROcodone-acetaminophen (NORCO/VICODIN) 5-325 MG per tablet 1 to 2 tabs every 4 to 6 hours as needed for pain.  Patient not taking:  Reported on 07/05/2015 20 tablet Reuben Likes, MD  salsalate (DISALCID) 750 MG tablet Take 2 tablets (1,500 mg total) by mouth 2 (two) times daily.  Patient not taking:  Reported on 07/05/2015 60 tablet Onalee Hua  Vivia Budge, MD  tiZANidine (ZANAFLEX) 4 MG tablet Take 1 tablet (4 mg total) by mouth every 6 (six) hours as needed for muscle spasms.  Patient not taking:  Reported on 07/05/2015 30 tablet Reuben Likes, MD  Allergies (verified on: 07/05/15)   (No Known Allergies)    Medical History   Past Medical History Date Comments  Hypertension (I10)    Surgical History   Past Surgical History Laterality Last Occurrence Comments  Hernia repair (ZOX09)     Fracture surgery (UEA540)     Whit lesions in eye with pain sudden onset     Patient is a 54 y.o. male presenting with foreign body in eye. The history is provided by the patient.  Foreign Body in Eye This is a new problem. The current episode started today. The problem occurs constantly. The problem has been unchanged. Pertinent negatives include no headaches. Nothing aggravates the symptoms. He has tried nothing for the symptoms. The treatment provided no relief.    Past Medical History  Diagnosis Date  . Hypertension    Past Surgical History  Procedure Laterality Date  . Hernia  repair    . Fracture surgery     Family History  Problem Relation Age of Onset  . Hypertension Mother   . Kidney disease Mother   . Lupus Sister    Social History  Substance Use Topics  . Smoking status: Current Every Day Smoker -- 0.80 packs/day    Types: Cigarettes  . Smokeless tobacco: None  . Alcohol Use: 0.0 oz/week    2-3 Cans of beer per week    Review of Systems  Eyes: Positive for pain. Negative for photophobia, discharge, redness, itching and visual disturbance.  Neurological: Negative for dizziness and headaches.  All other systems reviewed and are negative.     Allergies  Review of patient's allergies indicates no known allergies.  Home Medications   Prior to Admission medications   Medication Sig Start Date End Date Taking? Authorizing Provider  amLODipine (NORVASC) 10 MG tablet Take 10 mg by mouth daily.   Yes Historical Provider, MD  hydrochlorothiazide (MICROZIDE) 12.5 MG capsule Take 12.5 mg by mouth daily.   Yes Historical Provider, MD  ibuprofen (ADVIL,MOTRIN) 200 MG tablet Take 400 mg by mouth every 6 (six) hours as needed for moderate pain.   Yes Historical Provider, MD  ciprofloxacin (CIPRO) 500 MG tablet Take 1 tablet (500 mg total) by mouth every 12 (twelve) hours. Patient not taking: Reported on 07/05/2015 01/26/15   Reuben Likes, MD  HYDROcodone-acetaminophen (NORCO/VICODIN) 5-325 MG per tablet 1 to 2 tabs every 4 to 6 hours as needed for pain. Patient not taking: Reported on 07/05/2015 01/26/15   Reuben Likes, MD  salsalate (DISALCID) 750 MG tablet Take 2 tablets (1,500 mg total) by mouth 2 (two) times daily. Patient not taking: Reported on 07/05/2015 01/26/15   Reuben Likes, MD  tiZANidine (ZANAFLEX) 4 MG tablet Take 1 tablet (4 mg total) by mouth every 6 (six) hours as needed for muscle spasms. Patient not taking: Reported on 07/05/2015 01/26/15   Reuben Likes, MD   BP 162/100 mmHg  Pulse 72  Temp(Src) 97.5 F (36.4 C) (Oral)  Resp 18  Ht 5'  9" (1.753 m)  Wt 205 lb (92.987 kg)  BMI 30.26 kg/m2  SpO2 100% Physical Exam  Constitutional: He appears well-developed and well-nourished.  HENT:  Head: Normocephalic.  Eyes: Pupils are equal, round, and reactive to light.  Neck: Normal range of motion.  Cardiovascular: Normal rate.   Pulmonary/Chest: Effort normal.  Musculoskeletal: Normal range of motion.  Neurological: He is alert.  Skin: Skin is warm.  Nursing note and vitals reviewed.   ED Course  Procedures (including critical care time) Labs Review Labs Reviewed - No data to display  Imaging Review No results found. I have personally reviewed and evaluated these images and lab results as part of my medical decision-making.   EKG Interpretation None      MDM   Final diagnoses:  Pinguecula of left eye               Earley Favor, NP 08/18/15 1610  Derwood Kaplan, MD 08/18/15 [id number]  Derwood Kaplan, MD 08/18/15 1843

## 2016-07-13 ENCOUNTER — Ambulatory Visit (HOSPITAL_COMMUNITY)
Admission: EM | Admit: 2016-07-13 | Discharge: 2016-07-13 | Disposition: A | Discharge status: Home / Self Care | Payer: BLUE CROSS/BLUE SHIELD | Attending: Family Medicine | Admitting: Family Medicine

## 2016-07-13 ENCOUNTER — Encounter (HOSPITAL_COMMUNITY): Payer: Self-pay | Admitting: Emergency Medicine

## 2016-07-13 DIAGNOSIS — F17210 Nicotine dependence, cigarettes, uncomplicated: Secondary | ICD-10-CM | POA: Insufficient documentation

## 2016-07-13 DIAGNOSIS — Z841 Family history of disorders of kidney and ureter: Secondary | ICD-10-CM | POA: Insufficient documentation

## 2016-07-13 DIAGNOSIS — Z79899 Other long term (current) drug therapy: Secondary | ICD-10-CM | POA: Insufficient documentation

## 2016-07-13 DIAGNOSIS — L0291 Cutaneous abscess, unspecified: Secondary | ICD-10-CM

## 2016-07-13 DIAGNOSIS — I10 Essential (primary) hypertension: Secondary | ICD-10-CM | POA: Diagnosis not present

## 2016-07-13 DIAGNOSIS — Z8249 Family history of ischemic heart disease and other diseases of the circulatory system: Secondary | ICD-10-CM | POA: Insufficient documentation

## 2016-07-13 DIAGNOSIS — L02212 Cutaneous abscess of back [any part, except buttock]: Secondary | ICD-10-CM | POA: Diagnosis not present

## 2016-07-13 MED ORDER — LIDOCAINE-EPINEPHRINE (PF) 2 %-1:200000 IJ SOLN
INTRAMUSCULAR | Status: AC
  Filled 2016-07-13: qty 20

## 2016-07-13 MED ORDER — SULFAMETHOXAZOLE-TRIMETHOPRIM 800-160 MG PO TABS: 1.0000 | ORAL_TABLET | Freq: Two times a day (BID) | ORAL | 0 refills | Status: AC

## 2016-07-13 NOTE — ED Provider Notes (Addendum)
CSN: [id number]     Arrival date & time 07/13/16  1629 History   First MD Initiated Contact with Patient 07/13/16 1722     Chief Complaint  Patient presents with  . Abscess   (Consider location/radiation/quality/duration/timing/severity/associated sxs/prior Treatment) HPI 55 year old male third abscess in the last few years. He states that this abscess has been brewing for about one month. Has been using hot packs. Also a drawing salve. States that it feeling just got to a point that he had come in and have it looked at. Pain score is a 4. No fever or chills or other signs of sepsis. Past Medical History:  Diagnosis Date  . Hypertension    Past Surgical History:  Procedure Laterality Date  . FRACTURE SURGERY    . HERNIA REPAIR     Family History  Problem Relation Age of Onset  . Hypertension Mother   . Kidney disease Mother   . Lupus Sister    Social History  Substance Use Topics  . Smoking status: Current Every Day Smoker    Packs/day: 1.00    Types: Cigarettes  . Smokeless tobacco: Never Used  . Alcohol use 1.8 - 2.4 oz/week    3 - 4 Cans of beer per week     Comment: daily    Review of Systems  Denies: HEADACHE, NAUSEA, ABDOMINAL PAIN, CHEST PAIN, CONGESTION, DYSURIA, SHORTNESS OF BREATH  Allergies  Review of patient's allergies indicates no known allergies.  Home Medications   Prior to Admission medications   Medication Sig Start Date End Date Taking? Authorizing Provider  amLODipine (NORVASC) 10 MG tablet Take 10 mg by mouth daily.   Yes Historical Provider, MD  HYDROcodone-acetaminophen (NORCO/VICODIN) 5-325 MG per tablet 1 to 2 tabs every 4 to 6 hours as needed for pain. 01/26/15  Yes Reuben Likesavid C Keller, MD  ciprofloxacin (CIPRO) 500 MG tablet Take 1 tablet (500 mg total) by mouth every 12 (twelve) hours. Patient not taking: Reported on 07/05/2015 01/26/15   Reuben Likesavid C Keller, MD  hydrochlorothiazide (MICROZIDE) 12.5 MG capsule Take 12.5 mg by mouth daily.    Historical  Provider, MD  ibuprofen (ADVIL,MOTRIN) 200 MG tablet Take 400 mg by mouth every 6 (six) hours as needed for moderate pain.    Historical Provider, MD  salsalate (DISALCID) 750 MG tablet Take 2 tablets (1,500 mg total) by mouth 2 (two) times daily. Patient not taking: Reported on 07/05/2015 01/26/15   Reuben Likesavid C Keller, MD  sulfamethoxazole-trimethoprim (BACTRIM DS,SEPTRA DS) 800-160 MG tablet Take 1 tablet by mouth 2 (two) times daily. 07/13/16 07/20/16  Tharon AquasFrank C Patrick, PA  tiZANidine (ZANAFLEX) 4 MG tablet Take 1 tablet (4 mg total) by mouth every 6 (six) hours as needed for muscle spasms. Patient not taking: Reported on 07/05/2015 01/26/15   Reuben Likesavid C Keller, MD   Meds Ordered and Administered this Visit  Medications - No data to display  BP 167/93 (BP Location: Left Arm)   Pulse 63   Temp 98.2 F (36.8 C) (Oral)   Resp 18   Ht 5\' 9"  (1.753 m)   Wt 205 lb (93 kg)   SpO2 100%   BMI 30.27 kg/m  No data found.   Physical Exam NURSES NOTES AND VITAL SIGNS REVIEWED. CONSTITUTIONAL: Well developed, well nourished, no acute distress HEENT: normocephalic, atraumatic EYES: Conjunctiva normal NECK:normal ROM, supple, no adenopathy PULMONARY:No respiratory distress, normal effort ABDOMINAL: Soft, ND, NT BS+, No CVAT MUSCULOSKELETAL: Normal ROM of all extremities, Left back large 4 cm  abscess fluctuant with a head. There is minimal cellulitis surrounding the abscess. SKIN: warm and dry without rash PSYCHIATRIC: Mood and affect, behavior are normal  Urgent Care Course   Clinical Course    .Marland Kitchen.Incision and Drainage Date/Time: 07/13/2016 6:40 PM Performed by: Tharon AquasPATRICK, FRANK C Authorized by: Barbra SarksPATRICK, FRANK C   Consent:    Consent obtained:  Verbal   Consent given by:  Patient Location:    Type:  Abscess   Location:  Trunk   Trunk location:  Back Pre-procedure details:    Skin preparation:  Antiseptic wash and Betadine Anesthesia (see MAR for exact dosages):    Anesthesia method:  Local  infiltration   Local anesthetic:  Lidocaine 1% WITH epi and bupivacaine 0.5% w/o epi Procedure type:    Complexity:  Simple Procedure details:    Needle aspiration: no     Incision types:  Single straight   Scalpel blade:  11   Wound management:  Probed and deloculated   Drainage:  Purulent   Drainage amount:  Copious   Wound treatment:  Drain placed   Packing materials:  1/2 in iodoform gauze   Amount 1/2" iodoform:  6 Post-procedure details:    Patient tolerance of procedure:  Tolerated well, no immediate complications    (including critical care time)  Labs Review Labs Reviewed  AEROBIC CULTURE (SUPERFICIAL SPECIMEN)    Imaging Review No results found.   Visual Acuity Review  Right Eye Distance:   Left Eye Distance:   Bilateral Distance:    Right Eye Near:   Left Eye Near:    Bilateral Near:        55 year old male with abscess to his back incised and drained 6 inches of half-inch drain placed. Prescription for Bactrim DS provided. Instructions of care provided. MDM   1. Abscess     Patient is reassured that there are no issues that require transfer to higher level of care at this time or additional tests. Patient is advised to continue home symptomatic treatment. Patient is advised that if there are new or worsening symptoms to attend the emergency department, contact primary care provider, or return to UC. Instructions of care provided discharged home in stable condition.    THIS NOTE WAS GENERATED USING A VOICE RECOGNITION SOFTWARE PROGRAM. ALL REASONABLE EFFORTS  WERE MADE TO PROOFREAD THIS DOCUMENT FOR ACCURACY.  I have verbally reviewed the discharge instructions with the patient. A printed AVS was given to the patient.  All questions were answered prior to discharge.      Tharon AquasFrank C Patrick, PA 07/13/16 1844    Tharon AquasFrank C Patrick, PA 07/15/16 1902

## 2016-07-13 NOTE — ED Triage Notes (Signed)
Pt has a large abscess in the center of his back that is red.  It is not warm to the touch.  Pt denies any fever.  He noticed the abscess about 2 weeks ago, but it has become increasingly more swollen over the last two days.

## 2016-07-16 ENCOUNTER — Encounter (HOSPITAL_COMMUNITY): Payer: Self-pay | Admitting: Emergency Medicine

## 2016-07-16 ENCOUNTER — Ambulatory Visit (HOSPITAL_COMMUNITY)
Admission: EM | Admit: 2016-07-16 | Discharge: 2016-07-16 | Disposition: A | Discharge status: Home / Self Care | Payer: BLUE CROSS/BLUE SHIELD

## 2016-07-16 DIAGNOSIS — Z4801 Encounter for change or removal of surgical wound dressing: Secondary | ICD-10-CM

## 2016-07-16 DIAGNOSIS — L0291 Cutaneous abscess, unspecified: Secondary | ICD-10-CM

## 2016-07-16 LAB — AEROBIC CULTURE W GRAM STAIN (SUPERFICIAL SPECIMEN): Culture: NORMAL

## 2016-07-16 LAB — AEROBIC CULTURE  (SUPERFICIAL SPECIMEN)

## 2016-07-16 NOTE — ED Provider Notes (Signed)
CSN: [id number]     Arrival date & time 07/16/16  1206 History   First MD Initiated Contact with Patient 07/16/16 1302     Chief Complaint  Patient presents with  . Follow-up   (Consider location/radiation/quality/duration/timing/severity/associated sxs/prior Treatment) Patient presents with follow up for packing removal placed on 8.23.17. Patient denies any fevers or signs of sepsis  1 piece of packing removed.       Past Medical History:  Diagnosis Date  . Hypertension    Past Surgical History:  Procedure Laterality Date  . FRACTURE SURGERY    . HERNIA REPAIR     Family History  Problem Relation Age of Onset  . Hypertension Mother   . Kidney disease Mother   . Lupus Sister    Social History  Substance Use Topics  . Smoking status: Current Every Day Smoker    Packs/day: 1.00    Types: Cigarettes  . Smokeless tobacco: Never Used  . Alcohol use 1.8 - 2.4 oz/week    3 - 4 Cans of beer per week     Comment: daily    Review of Systems  Constitutional: Negative.   Musculoskeletal:       Abscess to back     Allergies  Review of patient's allergies indicates no known allergies.  Home Medications   Prior to Admission medications   Medication Sig Start Date End Date Taking? Authorizing Provider  amLODipine (NORVASC) 10 MG tablet Take 10 mg by mouth daily.   Yes Historical Provider, MD  hydrochlorothiazide (MICROZIDE) 12.5 MG capsule Take 12.5 mg by mouth daily.   Yes Historical Provider, MD  ciprofloxacin (CIPRO) 500 MG tablet Take 1 tablet (500 mg total) by mouth every 12 (twelve) hours. Patient not taking: Reported on 07/05/2015 01/26/15   Reuben Likesavid C Keller, MD  HYDROcodone-acetaminophen (NORCO/VICODIN) 5-325 MG per tablet 1 to 2 tabs every 4 to 6 hours as needed for pain. 01/26/15   Reuben Likesavid C Keller, MD  ibuprofen (ADVIL,MOTRIN) 200 MG tablet Take 400 mg by mouth every 6 (six) hours as needed for moderate pain.    Historical Provider, MD  methylPREDNISolone (MEDROL  DOSEPAK) 4 MG TBPK tablet follow package directions start on mon., take until finished. 07/24/16   Linna HoffJames D Kindl, MD  salsalate (DISALCID) 750 MG tablet Take 2 tablets (1,500 mg total) by mouth 2 (two) times daily. Patient not taking: Reported on 07/05/2015 01/26/15   Reuben Likesavid C Keller, MD  tiZANidine (ZANAFLEX) 4 MG tablet Take 1 tablet (4 mg total) by mouth every 6 (six) hours as needed for muscle spasms. Patient not taking: Reported on 07/05/2015 01/26/15   Reuben Likesavid C Keller, MD   Meds Ordered and Administered this Visit  Medications - No data to display  BP 146/90 (BP Location: Left Arm)   Pulse 63   Temp 98.2 F (36.8 C) (Oral)   Resp 15   SpO2 99%  No data found.   Physical Exam  Constitutional: He is oriented to person, place, and time. He appears well-nourished.  Neurological: He is alert and oriented to person, place, and time.  Skin: Skin is warm.  Abscess to noted to the right side of spine. Abscess approximately 5 cm in diameter. No active drainage noted upon removal of packing.      Urgent Care Course   Clinical Course    Procedures (including critical care time)  Labs Review Labs Reviewed - No data to display  Imaging Review No results found.   Visual Acuity Review  Right Eye Distance:   Left Eye Distance:   Bilateral Distance:    Right Eye Near:   Left Eye Near:    Bilateral Near:         MDM   1. Abscess        Alene Mires, NP 07/16/16 1307    Eustace Moore, MD 09/08/16 [phone]

## 2016-07-16 NOTE — ED Triage Notes (Signed)
Here for a f/u and to have packing removed from back... Reports he is feeling better and voices no new concerns.   A&O x4... NAD

## 2016-07-16 NOTE — Discharge Instructions (Signed)
Continue to take antibiotics until course is completed and ibuprofen as direcetd by back of the box. Change dressing once a week and as needed

## 2016-07-16 NOTE — ED Notes (Signed)
Placed 4x4 gauze and secured w/hypofix tape.

## 2016-07-16 NOTE — ED Notes (Signed)
Pt d/c by Jennifer, NP 

## 2016-07-24 ENCOUNTER — Ambulatory Visit (HOSPITAL_COMMUNITY)
Admission: EM | Admit: 2016-07-24 | Discharge: 2016-07-24 | Disposition: A | Discharge status: Home / Self Care | Payer: BLUE CROSS/BLUE SHIELD | Attending: Family Medicine | Admitting: Family Medicine

## 2016-07-24 ENCOUNTER — Encounter (HOSPITAL_COMMUNITY): Payer: Self-pay | Admitting: *Deleted

## 2016-07-24 DIAGNOSIS — G8929 Other chronic pain: Secondary | ICD-10-CM | POA: Diagnosis not present

## 2016-07-24 DIAGNOSIS — M549 Dorsalgia, unspecified: Secondary | ICD-10-CM

## 2016-07-24 MED ORDER — METHYLPREDNISOLONE 4 MG PO TBPK: ORAL_TABLET | ORAL | 0 refills | Status: DC

## 2016-07-24 MED ORDER — TRIAMCINOLONE ACETONIDE 40 MG/ML IJ SUSP
INTRAMUSCULAR | Status: AC
  Filled 2016-07-24: qty 1

## 2016-07-24 MED ORDER — TRIAMCINOLONE ACETONIDE 40 MG/ML IJ SUSP
40.0000 mg | Freq: Once | INTRAMUSCULAR | Status: AC
  Administered 2016-07-24: 40 mg via INTRAMUSCULAR

## 2016-07-24 NOTE — ED Provider Notes (Signed)
[facility]    CSN: [id number] Arrival date & time: 07/24/16  1200  First Provider Contact:  First MD Initiated Contact with Patient 07/24/16 1242        History   Chief Complaint Chief Complaint  Patient presents with  . Back Pain    HPI John Delacruz is a 55 y.o. male.    Back Pain  Location:  Lumbar spine Quality:  Stiffness Radiates to:  Does not radiate Pain severity:  Mild Onset quality:  Gradual Duration:  1 week Progression:  Unchanged Chronicity:  Chronic Context: lifting heavy objects and occupational injury   Context: not recent illness and not recent injury   Relieved by:  None tried Worsened by:  Nothing Ineffective treatments:  None tried Associated symptoms: no abdominal pain, no abdominal swelling, no bladder incontinence, no bowel incontinence, no fever, no numbness, no paresthesias, no pelvic pain, no tingling and no weakness     Past Medical History:  Diagnosis Date  . Hypertension     Patient Active Problem List   Diagnosis Date Noted  . Diverticulitis 12/18/2013  . HTN (hypertension) 01/01/2013    Past Surgical History:  Procedure Laterality Date  . FRACTURE SURGERY    . HERNIA REPAIR         Home Medications    Prior to Admission medications   Medication Sig Start Date End Date Taking? Authorizing Provider  amLODipine (NORVASC) 10 MG tablet Take 10 mg by mouth daily.    Historical Provider, MD  ciprofloxacin (CIPRO) 500 MG tablet Take 1 tablet (500 mg total) by mouth every 12 (twelve) hours. Patient not taking: Reported on 07/05/2015 01/26/15   Reuben Likesavid C Keller, MD  hydrochlorothiazide (MICROZIDE) 12.5 MG capsule Take 12.5 mg by mouth daily.    Historical Provider, MD  HYDROcodone-acetaminophen (NORCO/VICODIN) 5-325 MG per tablet 1 to 2 tabs every 4 to 6 hours as needed for pain. 01/26/15   Reuben Likesavid C Keller, MD  ibuprofen (ADVIL,MOTRIN) 200 MG tablet Take 400 mg by mouth every 6 (six) hours as needed for moderate pain.     Historical Provider, MD  salsalate (DISALCID) 750 MG tablet Take 2 tablets (1,500 mg total) by mouth 2 (two) times daily. Patient not taking: Reported on 07/05/2015 01/26/15   Reuben Likesavid C Keller, MD  tiZANidine (ZANAFLEX) 4 MG tablet Take 1 tablet (4 mg total) by mouth every 6 (six) hours as needed for muscle spasms. Patient not taking: Reported on 07/05/2015 01/26/15   Reuben Likesavid C Keller, MD    Family History Family History  Problem Relation Age of Onset  . Hypertension Mother   . Kidney disease Mother   . Lupus Sister     Social History Social History  Substance Use Topics  . Smoking status: Current Every Day Smoker    Packs/day: 1.00    Types: Cigarettes  . Smokeless tobacco: Never Used  . Alcohol use 1.8 - 2.4 oz/week    3 - 4 Cans of beer per week     Comment: daily     Allergies   Review of patient's allergies indicates no known allergies.   Review of Systems Review of Systems  Constitutional: Negative.  Negative for fever.  Gastrointestinal: Negative.  Negative for abdominal pain and bowel incontinence.  Genitourinary: Negative.  Negative for bladder incontinence and pelvic pain.  Musculoskeletal: Positive for back pain. Negative for gait problem and joint swelling.  Skin: Negative.   Neurological: Negative.  Negative for tingling, weakness, numbness and paresthesias.  All other systems reviewed and are negative.    Physical Exam Triage Vital Signs ED Triage Vitals  Enc Vitals Group     BP 07/24/16 1227 148/92     Pulse Rate 07/24/16 1227 60     Resp 07/24/16 1227 16     Temp 07/24/16 1227 99.1 F (37.3 C)     Temp Source 07/24/16 1227 Oral     SpO2 07/24/16 1227 99 %     Weight --      Height --      Head Circumference --      Peak Flow --      Pain Score 07/24/16 1242 6     Pain Loc --      Pain Edu? --      Excl. in GC? --    No data found.   Updated Vital Signs BP 148/92 (BP Location: Left Arm)   Pulse 60   Temp 99.1 F (37.3 C) (Oral)   Resp 16    SpO2 99%   Visual Acuity Right Eye Distance:   Left Eye Distance:   Bilateral Distance:    Right Eye Near:   Left Eye Near:    Bilateral Near:     Physical Exam  Constitutional: He is oriented to person, place, and time. He appears well-developed and well-nourished. No distress.  Abdominal: Soft. Bowel sounds are normal.  Musculoskeletal: He exhibits tenderness.       Lumbar back: He exhibits decreased range of motion, tenderness, pain and spasm. He exhibits no bony tenderness, no swelling, no deformity and normal pulse.       Back:  Neurological: He is alert and oriented to person, place, and time. He has normal reflexes.  Skin: Skin is warm and dry.  Nursing note and vitals reviewed.    UC Treatments / Results  Labs (all labs ordered are listed, but only abnormal results are displayed) Labs Reviewed - No data to display  EKG  EKG Interpretation None       Radiology No results found.  Procedures Procedures (including critical care time)  Medications Ordered in UC Medications - No data to display   Initial Impression / Assessment and Plan / UC Course  I have reviewed the triage vital signs and the nursing notes.  Pertinent labs & imaging results that were available during my care of the patient were reviewed by me and considered in my medical decision making (see chart for details).  Clinical Course      Final Clinical Impressions(s) / UC Diagnoses   Final diagnoses:  None    New Prescriptions New Prescriptions   No medications on file     Linna Hoff, MD 07/24/16 1300

## 2016-07-24 NOTE — ED Triage Notes (Signed)
Pt   Reports   Low  Back  Pain  Chronic  And  reoccuring   Pt  denys  Any  specefic  Injury  Ambulated  To room   denys  Any  Urinary      Symptoms

## 2016-08-06 NOTE — ED Provider Notes (Signed)
CSN: [id number]     Arrival date & time 07/24/16  1200 History   First MD Initiated Contact with Patient 07/24/16 1242     Chief Complaint  Patient presents with  . Back Pain   (Consider location/radiation/quality/duration/timing/severity/associated sxs/prior Treatment) HPI  Past Medical History:  Diagnosis Date  . Hypertension    Past Surgical History:  Procedure Laterality Date  . FRACTURE SURGERY    . HERNIA REPAIR     Family History  Problem Relation Age of Onset  . Hypertension Mother   . Kidney disease Mother   . Lupus Sister    Social History  Substance Use Topics  . Smoking status: Current Every Day Smoker    Packs/day: 1.00    Types: Cigarettes  . Smokeless tobacco: Never Used  . Alcohol use 1.8 - 2.4 oz/week    3 - 4 Cans of beer per week     Comment: daily    Review of Systems  Allergies  Review of patient's allergies indicates no known allergies.  Home Medications   Prior to Admission medications   Medication Sig Start Date End Date Taking? Authorizing Provider  amLODipine (NORVASC) 10 MG tablet Take 10 mg by mouth daily.    Historical Provider, MD  ciprofloxacin (CIPRO) 500 MG tablet Take 1 tablet (500 mg total) by mouth every 12 (twelve) hours. Patient not taking: Reported on 07/05/2015 01/26/15   Reuben Likesavid C Keller, MD  hydrochlorothiazide (MICROZIDE) 12.5 MG capsule Take 12.5 mg by mouth daily.    Historical Provider, MD  HYDROcodone-acetaminophen (NORCO/VICODIN) 5-325 MG per tablet 1 to 2 tabs every 4 to 6 hours as needed for pain. 01/26/15   Reuben Likesavid C Keller, MD  ibuprofen (ADVIL,MOTRIN) 200 MG tablet Take 400 mg by mouth every 6 (six) hours as needed for moderate pain.    Historical Provider, MD  methylPREDNISolone (MEDROL DOSEPAK) 4 MG TBPK tablet follow package directions start on mon., take until finished. 07/24/16   Linna HoffJames D Kindl, MD  salsalate (DISALCID) 750 MG tablet Take 2 tablets (1,500 mg total) by mouth 2 (two) times daily. Patient not taking:  Reported on 07/05/2015 01/26/15   Reuben Likesavid C Keller, MD  tiZANidine (ZANAFLEX) 4 MG tablet Take 1 tablet (4 mg total) by mouth every 6 (six) hours as needed for muscle spasms. Patient not taking: Reported on 07/05/2015 01/26/15   Reuben Likesavid C Keller, MD   Meds Ordered and Administered this Visit   Medications  triamcinolone acetonide (KENALOG-40) injection 40 mg (40 mg Intramuscular Given 07/24/16 1321)    BP 148/92 (BP Location: Left Arm)   Pulse 60   Temp 99.1 F (37.3 C) (Oral)   Resp 16   SpO2 99%  No data found.   Physical Exam  Urgent Care Course   Clinical Course    Procedures (including critical care time)  Labs Review Labs Reviewed - No data to display  Imaging Review No results found.   Visual Acuity Review  Right Eye Distance:   Left Eye Distance:   Bilateral Distance:    Right Eye Near:   Left Eye Near:    Bilateral Near:         MDM   1. Chronic back pain        Alene MiresJennifer C Omohundro, NP 08/06/16 1631

## 2016-12-01 ENCOUNTER — Encounter (HOSPITAL_COMMUNITY): Payer: Self-pay | Admitting: Family Medicine

## 2016-12-01 ENCOUNTER — Ambulatory Visit (HOSPITAL_COMMUNITY)
Admission: EM | Admit: 2016-12-01 | Discharge: 2016-12-01 | Disposition: A | Discharge status: Home / Self Care | Payer: BLUE CROSS/BLUE SHIELD | Attending: Emergency Medicine | Admitting: Emergency Medicine

## 2016-12-01 DIAGNOSIS — B349 Viral infection, unspecified: Secondary | ICD-10-CM

## 2016-12-01 DIAGNOSIS — R197 Diarrhea, unspecified: Secondary | ICD-10-CM

## 2016-12-01 DIAGNOSIS — A09 Infectious gastroenteritis and colitis, unspecified: Secondary | ICD-10-CM | POA: Diagnosis not present

## 2016-12-01 MED ORDER — PREDNISONE 50 MG PO TABS: ORAL_TABLET | ORAL | 0 refills | Status: DC

## 2016-12-01 MED ORDER — IPRATROPIUM-ALBUTEROL 0.5-2.5 (3) MG/3ML IN SOLN
3.0000 mL | Freq: Once | RESPIRATORY_TRACT | Status: AC
  Administered 2016-12-01: 3 mL via RESPIRATORY_TRACT

## 2016-12-01 MED ORDER — ALBUTEROL SULFATE HFA 108 (90 BASE) MCG/ACT IN AERS: 2.0000 | INHALATION_SPRAY | RESPIRATORY_TRACT | 0 refills | Status: DC | PRN

## 2016-12-01 MED ORDER — IPRATROPIUM-ALBUTEROL 0.5-2.5 (3) MG/3ML IN SOLN
RESPIRATORY_TRACT | Status: AC
  Filled 2016-12-01: qty 3

## 2016-12-01 NOTE — ED Triage Notes (Signed)
Pt here with cough, fever, body aches, chills for the [past 2 days. sts also diarrhea.

## 2016-12-01 NOTE — Discharge Instructions (Signed)
Drink plenty of fluids and stay well-hydrated. As long as you have diarrhea recommend limiting amounts of food increasing liquid intake. No greasy, fatty, spicy or fast foods for the next 2 or 3 days. To slow the diarrhea down you may take Imodium 1 tablet every 6-8 hours to slow it down. Do not take enough to stop it completely. Use the albuterol inhaler as needed for cough and wheeze and take the prednisone daily as directed with food. The following medications will help with some of your other symptoms such as runny nose, drainage or stuffiness. Drink plenty of fluids and stay well-hydrated. As long as you have diarrhea recommend limiting amounts of food increasing liquid intake. No greasy, fatty, spicy or fast foods for the next 2 or 3 days. To slow the diarrhea down you may take Imodium 1 tablet every 6-8 hours to slow it down. Do not take enough to stop it completely. Use the albuterol inhaler as needed for cough and wheeze and take the prednisone daily as directed with food. The following medications will help with some of your other symptoms such as runny nose, drainage or stuffiness.

## 2016-12-01 NOTE — ED Provider Notes (Signed)
CSN: [id number]     Arrival date & time 12/01/16  1059 History   First MD Initiated Contact with Patient 12/01/16 1234     Chief Complaint  Patient presents with  . Cough  . Diarrhea  . Generalized Body Aches   (Consider location/radiation/quality/duration/timing/severity/associated sxs/prior Treatment) 56 year old male smoker presents to the urgent care with one and a half day history of chills, feeling weak, decreased appetite, questionable fever and diarrhea. He also has runny nose, upper respiratory congestion and cough. Denies vomiting. He is uncertain about whether he has had a fever at home, currently is 99.3 here. Pulse is 76.      Past Medical History:  Diagnosis Date  . Hypertension    Past Surgical History:  Procedure Laterality Date  . FRACTURE SURGERY    . HERNIA REPAIR     Family History  Problem Relation Age of Onset  . Hypertension Mother   . Kidney disease Mother   . Lupus Sister    Social History  Substance Use Topics  . Smoking status: Current Every Day Smoker    Packs/day: 1.00    Types: Cigarettes  . Smokeless tobacco: Never Used  . Alcohol use 1.8 - 2.4 oz/week    3 - 4 Cans of beer per week     Comment: daily    Review of Systems  Constitutional: Positive for activity change and fever. Negative for diaphoresis and fatigue.  HENT: Positive for congestion, postnasal drip and rhinorrhea. Negative for ear pain, facial swelling, sore throat and trouble swallowing.   Eyes: Negative for pain, discharge and redness.  Respiratory: Positive for cough. Negative for chest tightness and shortness of breath.   Cardiovascular: Negative.   Gastrointestinal: Negative.   Musculoskeletal: Negative.  Negative for neck pain and neck stiffness.  Neurological: Negative.   All other systems reviewed and are negative.   Allergies  Patient has no known allergies.  Home Medications   Prior to Admission medications   Medication Sig Start Date End Date Taking?  Authorizing Provider  albuterol (PROVENTIL HFA;VENTOLIN HFA) 108 (90 Base) MCG/ACT inhaler Inhale 2 puffs into the lungs every 4 (four) hours as needed for wheezing or shortness of breath. 12/01/16   Hayden Rasmussen, NP  amLODipine (NORVASC) 10 MG tablet Take 10 mg by mouth daily.    Historical Provider, MD  ciprofloxacin (CIPRO) 500 MG tablet Take 1 tablet (500 mg total) by mouth every 12 (twelve) hours. Patient not taking: Reported on 07/05/2015 01/26/15   Reuben Likes, MD  hydrochlorothiazide (MICROZIDE) 12.5 MG capsule Take 12.5 mg by mouth daily.    Historical Provider, MD  HYDROcodone-acetaminophen (NORCO/VICODIN) 5-325 MG per tablet 1 to 2 tabs every 4 to 6 hours as needed for pain. 01/26/15   Reuben Likes, MD  ibuprofen (ADVIL,MOTRIN) 200 MG tablet Take 400 mg by mouth every 6 (six) hours as needed for moderate pain.    Historical Provider, MD  methylPREDNISolone (MEDROL DOSEPAK) 4 MG TBPK tablet follow package directions start on mon., take until finished. 07/24/16   Linna Hoff, MD  predniSONE (DELTASONE) 50 MG tablet 1 tab po daily for 6 days. Take with food. 12/01/16   Hayden Rasmussen, NP  salsalate (DISALCID) 750 MG tablet Take 2 tablets (1,500 mg total) by mouth 2 (two) times daily. Patient not taking: Reported on 07/05/2015 01/26/15   Reuben Likes, MD  tiZANidine (ZANAFLEX) 4 MG tablet Take 1 tablet (4 mg total) by mouth every 6 (six) hours as needed  for muscle spasms. Patient not taking: Reported on 07/05/2015 01/26/15   Reuben Likesavid C Keller, MD   Meds Ordered and Administered this Visit   Medications  ipratropium-albuterol (DUONEB) 0.5-2.5 (3) MG/3ML nebulizer solution 3 mL (3 mLs Nebulization Given 12/01/16 1254)    BP 149/98   Pulse 76   Temp 99.3 F (37.4 C)   Resp 18   SpO2 97%  No data found.   Physical Exam  Constitutional: He is oriented to person, place, and time. He appears well-developed and well-nourished. No distress.  HENT:  Right Ear: External ear normal.  Left Ear: External  ear normal.  Eyes: EOM are normal.  Neck: Normal range of motion. Neck supple.  Cardiovascular: Normal rate, regular rhythm and normal heart sounds.   Pulmonary/Chest: Effort normal and breath sounds normal. No respiratory distress.  Good air movement. Minor wheeze lower on the left. No crackles or rhonchi.  Abdominal: Soft. There is no tenderness.  Musculoskeletal: Normal range of motion. He exhibits no edema.  Lymphadenopathy:    He has no cervical adenopathy.  Neurological: He is alert and oriented to person, place, and time.  Skin: Skin is warm and dry.  Nursing note and vitals reviewed.   Urgent Care Course   Clinical Course     Procedures (including critical care time)  Labs Review Labs Reviewed - No data to display  Imaging Review No results found.   Visual Acuity Review  Right Eye Distance:   Left Eye Distance:   Bilateral Distance:    Right Eye Near:   Left Eye Near:    Bilateral Near:         MDM   1. Viral illness   2. Diarrhea of presumed infectious origin    Post DuoNeb the patient's lungs are now clear, no wheezing. Good air movement. Patient likely has a viral syndrome. Doubt flu. Sudafed PE 10 mg every 4 to 6 hours as needed for congestion Allegra or Zyrtec daily as needed for drainage and runny nose. For stronger antihistamine may take Chlor-Trimeton 2 to 4 mg every 4 to 6 hours, may cause drowsiness. Saline nasal spray used frequently. Ibuprofen 600 mg every 6 hours as needed for pain, discomfort or fever. Drink plenty of fluids and stay well-hydrated.    Hayden Rasmussenavid Hanne Kegg, NP 12/01/16 1316    Hayden Rasmussenavid Jamise Pentland, NP 12/01/16 1322

## 2016-12-28 ENCOUNTER — Ambulatory Visit: Payer: Self-pay | Admitting: Podiatry

## 2017-01-05 ENCOUNTER — Ambulatory Visit (INDEPENDENT_AMBULATORY_CARE_PROVIDER_SITE_OTHER): Payer: BLUE CROSS/BLUE SHIELD | Admitting: Podiatry

## 2017-01-05 ENCOUNTER — Encounter: Payer: Self-pay | Admitting: Podiatry

## 2017-01-05 ENCOUNTER — Ambulatory Visit (INDEPENDENT_AMBULATORY_CARE_PROVIDER_SITE_OTHER): Payer: BLUE CROSS/BLUE SHIELD

## 2017-01-05 VITALS — Resp 16 | Ht 69.0 in | Wt 195.0 lb

## 2017-01-05 DIAGNOSIS — L84 Corns and callosities: Secondary | ICD-10-CM

## 2017-01-05 DIAGNOSIS — M779 Enthesopathy, unspecified: Secondary | ICD-10-CM

## 2017-01-05 DIAGNOSIS — M2040 Other hammer toe(s) (acquired), unspecified foot: Secondary | ICD-10-CM | POA: Diagnosis not present

## 2017-01-05 DIAGNOSIS — M21619 Bunion of unspecified foot: Secondary | ICD-10-CM | POA: Diagnosis not present

## 2017-01-05 MED ORDER — TRIAMCINOLONE ACETONIDE 10 MG/ML IJ SUSP
10.0000 mg | Freq: Once | INTRAMUSCULAR | Status: AC
  Administered 2017-01-05: 10 mg

## 2017-01-05 NOTE — Progress Notes (Signed)
   Subjective:    Patient ID: John Delacruz, male    DOB: Apr 20, 1961, 56 y.o.   MRN: [id number]  HPI  Chief Complaint  Patient presents with  . Callouses    BL; Medial and Lateral side of foot. Left; Between 1st and 2nd toe.    . Skin Lesion    BL; Plantar Forefoot  . Nail Problem    Discolored and thick; Right, Great Toe. Left; All toes.   . Flat Foot       Review of Systems     Objective:   Physical Exam        Assessment & Plan:

## 2017-01-05 NOTE — Progress Notes (Signed)
Subjective:     Patient ID: John Delacruz, male   DOB: Apr 09, 1961, 56 y.o.   MRN: [id number]  HPI patient presents with severe structural malalignment left over right with significant digital deformities between the hallux and big toe fifth digits bilateral with rotation of the toes and extreme pain when pressed. Also has other lesions that are not as painful and states the bunion can become painful in between the toes is increasingly getting worse   Review of Systems  All other systems reviewed and are negative.      Objective:   Physical Exam  Constitutional: He is oriented to person, place, and time.  Cardiovascular: Intact distal pulses.   Musculoskeletal: Normal range of motion.  Neurological: He is oriented to person, place, and time.  Skin: Skin is warm.  Nursing note and vitals reviewed.  neurovascular status intact muscle strength adequate range of motion within normal limits with significant structural malalignment left over right with hallux deviated against the second toe with large keratotic lesion between the hallux and second digits and also significant keratotic lesion fifth digit bilateral that are painful when pressed and making shoe gear difficult. Patient's found to have good digital perfusion and is well oriented 3     Assessment:     It was structural bunion deformity hallux interphalangeus deformity left over right with inflammatory capsule of the interphalangeal joint left hallux lateral side and keratotic lesions hallux fifth digit bilateral    Plan:     H&P all condition and x-rays reviewed and today I did go ahead and I anesthetized the left hallux I injected the interphalangeal joint left hallux 3 mg [place] some Kenalog 5 mill grams Xylocaine and debrided all lesions applied padding and discussed ultimate surgical intervention. Patient be seen back 2 months or earlier if necessary   X-ray indicates there is significant structural malalignment left over right  foot with bunion deformity hammertoe deformity and keratotic tissue formation

## 2017-03-02 ENCOUNTER — Ambulatory Visit: Payer: BLUE CROSS/BLUE SHIELD | Admitting: Podiatry

## 2017-07-12 ENCOUNTER — Encounter (HOSPITAL_COMMUNITY): Payer: Self-pay | Admitting: Emergency Medicine

## 2017-07-12 ENCOUNTER — Ambulatory Visit (HOSPITAL_COMMUNITY)
Admission: EM | Admit: 2017-07-12 | Discharge: 2017-07-12 | Disposition: A | Discharge status: Home / Self Care | Payer: BLUE CROSS/BLUE SHIELD | Attending: Family Medicine | Admitting: Family Medicine

## 2017-07-12 DIAGNOSIS — G8929 Other chronic pain: Secondary | ICD-10-CM

## 2017-07-12 DIAGNOSIS — M545 Low back pain, unspecified: Secondary | ICD-10-CM

## 2017-07-12 DIAGNOSIS — T148XXA Other injury of unspecified body region, initial encounter: Secondary | ICD-10-CM | POA: Diagnosis not present

## 2017-07-12 MED ORDER — PREDNISONE 10 MG (21) PO TBPK: ORAL_TABLET | ORAL | 0 refills | Status: DC

## 2017-07-12 NOTE — ED Provider Notes (Signed)
[facility]    CSN: [id number] Arrival date & time: 07/12/17  1709     History   Chief Complaint Chief Complaint  Patient presents with  . Back Pain    HPI John Delacruz is a 56 y.o. male.   56 year old male with chronic intermittent low back pain recently with an exacerbation starting about 4 weeks ago. He works in [employer] which tends to elicited or exacerbates his low back pain. Days complaining of pain to the left low back. Denies spinal pain or injury. Denies focal paresthesias or weakness. He usually receives a prednisone pack taper dose and this helps. He has not been performing his exercises and stretches recently and believes this is the reason his back is worse.      Past Medical History:  Diagnosis Date  . Hypertension     Patient Active Problem List   Diagnosis Date Noted  . Diverticulitis 12/18/2013  . HTN (hypertension) 01/01/2013    Past Surgical History:  Procedure Laterality Date  . FRACTURE SURGERY    . HERNIA REPAIR         Home Medications    Prior to Admission medications   Medication Sig Start Date End Date Taking? Authorizing Provider  amLODipine (NORVASC) 10 MG tablet Take 10 mg by mouth daily.    [provider]  hydrochlorothiazide (MICROZIDE) 12.5 MG capsule Take 12.5 mg by mouth daily.    [provider]  ibuprofen (ADVIL,MOTRIN) 200 MG tablet Take 400 mg by mouth every 6 (six) hours as needed for moderate pain.    [provider]  predniSONE (STERAPRED UNI-PAK 21 TAB) 10 MG (21) TBPK tablet Take as directed with food 07/12/17   Hayden Rasmussen, NP  salsalate (DISALCID) 750 MG tablet Take 2 tablets (1,500 mg total) by mouth 2 (two) times daily. Patient not taking: Reported on 07/05/2015 01/26/15   Reuben Likes, MD    Family History Family History  Problem Relation Age of Onset  . Hypertension Mother   . Kidney disease Mother   . Lupus Sister     Social History Social History  Substance  Use Topics  . Smoking status: Current Every Day Smoker    Packs/day: 1.00    Types: Cigarettes  . Smokeless tobacco: Never Used  . Alcohol use 1.8 - 2.4 oz/week    3 - 4 Cans of beer per week     Comment: daily     Allergies   Lisinopril   Review of Systems Review of Systems  Constitutional: Negative.   Respiratory: Negative.   Gastrointestinal: Negative.   Genitourinary: Negative.   Musculoskeletal: Positive for back pain and myalgias.       As per HPI  Skin: Negative.   Neurological: Negative for dizziness, weakness, numbness and headaches.  All other systems reviewed and are negative.    Physical Exam Triage Vital Signs ED Triage Vitals [07/12/17 1749]  Enc Vitals Group     BP (!) 161/106     Pulse Rate 67     Resp 16     Temp 98.4 F (36.9 C)     Temp Source Oral     SpO2 100 %     Weight 205 lb (93 kg)     Height 5\' 9"  (1.753 m)     Head Circumference      Peak Flow      Pain Score 5     Pain Loc      Pain Edu?  Excl. in GC?    No data found.   Updated Vital Signs BP (!) 161/106 (BP Location: Left Arm)   Pulse 67   Temp 98.4 F (36.9 C) (Oral)   Resp 16   Ht 5\' 9"  (1.753 m)   Wt 205 lb (93 kg)   SpO2 100%   BMI 30.27 kg/m   Visual Acuity Right Eye Distance:   Left Eye Distance:   Bilateral Distance:    Right Eye Near:   Left Eye Near:    Bilateral Near:     Physical Exam  Constitutional: He is oriented to person, place, and time. He appears well-developed and well-nourished.  HENT:  Head: Normocephalic and atraumatic.  Eyes: EOM are normal. Left eye exhibits no discharge.  Neck: Normal range of motion. Neck supple.  Pulmonary/Chest: Effort normal.  Musculoskeletal: Normal range of motion. He exhibits no edema or tenderness.  Having patient lean forward or lean forward and right produces pain in the right low paralumbar lumbar musculature.  Neurological: He is alert and oriented to person, place, and time. No cranial nerve  deficit.  Skin: Skin is warm and dry.  Psychiatric: He has a normal mood and affect.  Nursing note and vitals reviewed.    UC Treatments / Results  Labs (all labs ordered are listed, but only abnormal results are displayed) Labs Reviewed - No data to display  EKG  EKG Interpretation None       Radiology No results found.  Procedures Procedures (including critical care time)  Medications Ordered in UC Medications - No data to display   Initial Impression / Assessment and Plan / UC Course  I have reviewed the triage vital signs and the nursing notes.  Pertinent labs & imaging results that were available during my care of the patient were reviewed by me and considered in my medical decision making (see chart for details).     Perform stretches as demonstrated. Start back with your exercises. Take the medicine with food as directed.   Final Clinical Impressions(s) / UC Diagnoses   Final diagnoses:  Chronic left-sided low back pain without sciatica  Muscle strain    New Prescriptions New Prescriptions   PREDNISONE (STERAPRED UNI-PAK 21 TAB) 10 MG (21) TBPK TABLET    Take as directed with food     Controlled Substance Prescriptions [place] Controlled Substance Registry consulted? Not Applicable   Hayden Rasmussen, NP 07/12/17 1827

## 2017-07-12 NOTE — Discharge Instructions (Signed)
Perform stretches as demonstrated. Start back with your exercises. Take the medicine with food as directed.

## 2017-07-12 NOTE — ED Triage Notes (Signed)
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 Pt denies numbness/tingling, or dysuria. Ambulatory with steady gait.

## 2017-07-12 NOTE — ED Notes (Signed)
Patient verbalized understanding of discharge instructions and denies any further needs or questions at this time. VS stable. Patient ambulatory with steady gait.  

## 2018-02-21 ENCOUNTER — Encounter (HOSPITAL_COMMUNITY): Payer: Self-pay | Admitting: Emergency Medicine

## 2018-02-21 ENCOUNTER — Ambulatory Visit (HOSPITAL_COMMUNITY)
Admission: EM | Admit: 2018-02-21 | Discharge: 2018-02-21 | Disposition: A | Discharge status: Home / Self Care | Payer: BLUE CROSS/BLUE SHIELD | Attending: Family Medicine | Admitting: Family Medicine

## 2018-02-21 DIAGNOSIS — S39012A Strain of muscle, fascia and tendon of lower back, initial encounter: Secondary | ICD-10-CM | POA: Diagnosis not present

## 2018-02-21 MED ORDER — PREDNISONE 10 MG (21) PO TBPK: ORAL_TABLET | Freq: Every day | ORAL | 0 refills | Status: DC

## 2018-02-21 NOTE — ED Triage Notes (Signed)
Pt sts lower back pain x 1 month

## 2018-02-22 NOTE — ED Provider Notes (Signed)
[facility]   [id number] 02/21/18 Arrival Time: 1712  ASSESSMENT & PLAN:  1. Strain of lumbar region, initial encounter     Meds ordered this encounter  Medications  . predniSONE (STERAPRED UNI-PAK 21 TAB) 10 MG (21) TBPK tablet    Sig: Take by mouth daily. Take as directed.    Dispense:  21 tablet    Refill:  0   Will follow up with PCP or here if worsening or failing to improve as anticipated.  Reviewed expectations re: course of current medical issues. Questions answered. Outlined signs and symptoms indicating need for more acute intervention. Patient verbalized understanding. After Visit Summary given.   SUBJECTIVE: History from: patient.  John Delacruz is a 57 y.o. male who presents with complaint of intermittent bilateral lower back discomfort. Onset gradual beginning about a month ago. Injury/trama: no. History of back problems: yes, similar with occasional exacerbations; no triggers identified. Previous back surgery: no. Discomfort described as aching without radiation. Certain movements exacerbate the described discomfort. Better with rest. Extremity sensation changes or weakness: none. Ambulatory without difficulty. Normal bowel/bladder habits. No associated abdominal pain/n/v. Self treatment: tried OTCs without relief of pain. Reports steroids usually solve the problem.  Patient reports no fevers, IV drug use, recent back surgeries or procedures, urinary incontinence, or bowel incontinence.  ROS: As per HPI.   OBJECTIVE:  Vitals:   02/21/18 1750  BP: (!) 162/94  Pulse: 73  Resp: 18  Temp: 98.6 F (37 C)  TempSrc: Oral  SpO2: 99%    General appearance: alert; no distress Neck: supple with FROM; without midline tenderness Lungs: unlabored respirations; symmetrical air entry Abdomen: soft, non-tender; bowel sounds normal; no masses or organomegaly; no guarding or rebound tenderness Back: bilateral lower tenderness present over bilateral paralumbar  musculature; FROM at hips with mild discomfort reported; bruising: none; without midline tenderness Extremities: no cyanosis or edema; symmetrical with no gross deformities Skin: warm and dry Neurologic: normal gait; normal symmetric reflexes; normal LE strength and sensation Psychological: alert and cooperative; normal mood and affect   Allergies  Allergen Reactions  . Lisinopril     Blurred vision     Past Medical History:  Diagnosis Date  . Hypertension    Social History   Socioeconomic History  . Marital status: Single    Spouse name: Not on file  . Number of children: Not on file  . Years of education: Not on file  . Highest education level: Not on file  Occupational History  . Not on file  Social Needs  . Financial resource strain: Not on file  . Food insecurity:    Worry: Not on file    Inability: Not on file  . Transportation needs:    Medical: Not on file    Non-medical: Not on file  Tobacco Use  . Smoking status: Current Every Day Smoker    Packs/day: 1.00    Types: Cigarettes  . Smokeless tobacco: Never Used  Substance and Sexual Activity  . Alcohol use: Yes    Alcohol/week: 1.8 - 2.4 oz    Types: 3 - 4 Cans of beer per week    Comment: daily  . Drug use: No  . Sexual activity: Yes  Lifestyle  . Physical activity:    Days per week: Not on file    Minutes per session: Not on file  . Stress: Not on file  Relationships  . Social connections:    Talks on phone: Not on file  Gets together: Not on file    Attends religious service: Not on file    Active member of club or organization: Not on file    Attends meetings of clubs or organizations: Not on file    Relationship status: Not on file  . Intimate partner violence:    Fear of current or ex partner: Not on file    Emotionally abused: Not on file    Physically abused: Not on file    Forced sexual activity: Not on file  Other Topics Concern  . Not on file  Social History Narrative  . Not on  file   Family History  Problem Relation Age of Onset  . Hypertension Mother   . Kidney disease Mother   . Lupus Sister    Past Surgical History:  Procedure Laterality Date  . FRACTURE SURGERY    . HERNIA REPAIR       Mardella LaymanHagler, Reba Hulett, MD 02/26/18 [phone]

## 2019-04-08 ENCOUNTER — Encounter (HOSPITAL_COMMUNITY): Payer: Self-pay | Admitting: Emergency Medicine

## 2019-04-08 ENCOUNTER — Ambulatory Visit (HOSPITAL_COMMUNITY)
Admission: EM | Admit: 2019-04-08 | Discharge: 2019-04-08 | Disposition: A | Discharge status: Home / Self Care | Payer: BLUE CROSS/BLUE SHIELD | Attending: Family Medicine | Admitting: Family Medicine

## 2019-04-08 ENCOUNTER — Other Ambulatory Visit: Payer: Self-pay

## 2019-04-08 DIAGNOSIS — L0291 Cutaneous abscess, unspecified: Secondary | ICD-10-CM | POA: Diagnosis not present

## 2019-04-08 DIAGNOSIS — I10 Essential (primary) hypertension: Secondary | ICD-10-CM

## 2019-04-08 DIAGNOSIS — B9689 Other specified bacterial agents as the cause of diseases classified elsewhere: Secondary | ICD-10-CM

## 2019-04-08 MED ORDER — LIDOCAINE HCL 2 % IJ SOLN
INTRAMUSCULAR | Status: AC
  Filled 2019-04-08: qty 20

## 2019-04-08 MED ORDER — CEPHALEXIN 500 MG PO CAPS: 500.0000 mg | ORAL_CAPSULE | Freq: Four times a day (QID) | ORAL | 0 refills | Status: DC

## 2019-04-08 NOTE — ED Triage Notes (Signed)
Noticed abscess on back last week.  Patient has a history or the same

## 2019-04-08 NOTE — Discharge Instructions (Signed)
We I&D the abscess today Take the Keflex as prescribed. Make sure you keep a close watch on the area and if symptoms do not improve or worsen you need to be re-seen.

## 2019-04-08 NOTE — ED Provider Notes (Signed)
[facility]    CSN: [id number] Arrival date & time: 04/08/19  [id number]     History   Chief Complaint Chief Complaint  Patient presents with  . Abscess    HPI John Delacruz is a 58 y.o. male.   Patient is a 58 year old male that presents today with abscess to back.  This has been present and worsening for approximate 1 week.  He has history of same in the past.  Denies any fevers or drainage from the abscess.  He is having mild pain more with laying on the back.  He has not done anything to treat the symptoms.  Denies any history of MRSA.  ROS per HPI      Past Medical History:  Diagnosis Date  . Hypertension     Patient Active Problem List   Diagnosis Date Noted  . Diverticulitis 12/18/2013  . HTN (hypertension) 01/01/2013    Past Surgical History:  Procedure Laterality Date  . FRACTURE SURGERY    . HERNIA REPAIR         Home Medications    Prior to Admission medications   Medication Sig Start Date End Date Taking? Authorizing Provider  amLODipine (NORVASC) 10 MG tablet Take 10 mg by mouth daily.    [provider]  cephALEXin (KEFLEX) 500 MG capsule Take 1 capsule (500 mg total) by mouth 4 (four) times daily. 04/08/19   Dahlia ByesBast, Charlayne Vultaggio A, NP  hydrochlorothiazide (MICROZIDE) 12.5 MG capsule Take 12.5 mg by mouth daily.    [provider]  ibuprofen (ADVIL,MOTRIN) 200 MG tablet Take 400 mg by mouth every 6 (six) hours as needed for moderate pain.    [provider]    Family History Family History  Problem Relation Age of Onset  . Hypertension Mother   . Kidney disease Mother   . Lupus Sister     Social History Social History   Tobacco Use  . Smoking status: Current Every Day Smoker    Packs/day: 1.00    Types: Cigarettes  . Smokeless tobacco: Never Used  Substance Use Topics  . Alcohol use: Yes    Alcohol/week: 3.0 - 4.0 standard drinks    Types: 3 - 4 Cans of beer per week    Comment: daily  . Drug use: No      Allergies   Lisinopril   Review of Systems Review of Systems   Physical Exam Triage Vital Signs ED Triage Vitals  Enc Vitals Group     BP 04/08/19 0901 (!) 152/92     Pulse Rate 04/08/19 0901 66     Resp 04/08/19 0901 18     Temp 04/08/19 0901 98.5 F (36.9 C)     Temp Source 04/08/19 0901 Oral     SpO2 04/08/19 0901 98 %     Weight --      Height --      Head Circumference --      Peak Flow --      Pain Score 04/08/19 0900 1     Pain Loc --      Pain Edu? --      Excl. in GC? --    No data found.  Updated Vital Signs BP (!) 152/92 (BP Location: Left Arm) Comment: has not had medicine today  Pulse 66   Temp 98.5 F (36.9 C) (Oral)   Resp 18   SpO2 98%   Visual Acuity Right Eye Distance:   Left Eye Distance:   Bilateral  Distance:    Right Eye Near:   Left Eye Near:    Bilateral Near:     Physical Exam Vitals signs and nursing note reviewed.  Constitutional:      Appearance: Normal appearance.  HENT:     Head: Normocephalic and atraumatic.     Nose: Nose normal.  Eyes:     Conjunctiva/sclera: Conjunctivae normal.  Neck:     Musculoskeletal: Normal range of motion.  Pulmonary:     Effort: Pulmonary effort is normal.  Musculoskeletal: Normal range of motion.  Skin:    General: Skin is warm and dry.          Comments: Approx 4 to 5 cm abscess with 1 cm fluctuance and surrounding induration and erythema.   Neurological:     Mental Status: He is alert.  Psychiatric:        Mood and Affect: Mood normal.      UC Treatments / Results  Labs (all labs ordered are listed, but only abnormal results are displayed) Labs Reviewed - No data to display  EKG None  Radiology No results found.  Procedures Incision and Drainage Date/Time: 04/08/2019 10:16 AM Performed by: Janace Aris, NP Authorized by: Janace Aris, NP   Consent:    Consent obtained:  Verbal   Consent given by:  Patient   Risks discussed:  Bleeding, incomplete drainage,  pain and damage to other organs   Alternatives discussed:  No treatment Universal protocol:    Patient identity confirmed:  Verbally with patient Location:    Type:  Abscess   Location:  Trunk   Trunk location:  Back Pre-procedure details:    Skin preparation:  Betadine Anesthesia (see MAR for exact dosages):    Anesthesia method:  Local infiltration   Local anesthetic:  Lidocaine 2% w/o epi Procedure type:    Complexity:  Complex Procedure details:    Incision types:  Single straight   Incision depth:  Subcutaneous   Scalpel blade:  11   Wound management:  Probed and deloculated   Drainage:  Purulent and bloody   Drainage amount:  Moderate   Wound treatment:  Wound left open Post-procedure details:    Patient tolerance of procedure:  Tolerated well, no immediate complications   (including critical care time)  Medications Ordered in UC Medications - No data to display  Initial Impression / Assessment and Plan / UC Course  I have reviewed the triage vital signs and the nursing notes.  Pertinent labs & imaging results that were available during my care of the patient were reviewed by me and considered in my medical decision making (see chart for details).     I&D of abscess Pt tolerated well Keflex for abx coverage.  Final Clinical Impressions(s) / UC Diagnoses   Final diagnoses:  Abscess     Discharge Instructions     We I&D the abscess today Take the Keflex as prescribed. Make sure you keep a close watch on the area and if symptoms do not improve or worsen you need to be re-seen.    ED Prescriptions    Medication Sig Dispense Auth. Provider   cephALEXin (KEFLEX) 500 MG capsule Take 1 capsule (500 mg total) by mouth 4 (four) times daily. 28 capsule Dahlia Byes A, NP     Controlled Substance Prescriptions [place] Controlled Substance Registry consulted? Not Applicable   Janace Aris, NP 04/08/19 1017

## 2019-08-29 ENCOUNTER — Encounter: Payer: Self-pay | Admitting: Emergency Medicine

## 2019-08-29 ENCOUNTER — Ambulatory Visit
Admission: EM | Admit: 2019-08-29 | Discharge: 2019-08-29 | Disposition: A | Discharge status: Home / Self Care | Payer: BC Managed Care – PPO | Attending: Physician Assistant | Admitting: Physician Assistant

## 2019-08-29 ENCOUNTER — Inpatient Hospital Stay: Admission: RE | Admit: 2019-08-29 | Payer: BC Managed Care – PPO | Source: Ambulatory Visit

## 2019-08-29 ENCOUNTER — Other Ambulatory Visit: Payer: Self-pay

## 2019-08-29 DIAGNOSIS — M545 Low back pain: Secondary | ICD-10-CM | POA: Diagnosis not present

## 2019-08-29 DIAGNOSIS — I10 Essential (primary) hypertension: Secondary | ICD-10-CM | POA: Diagnosis not present

## 2019-08-29 DIAGNOSIS — G8929 Other chronic pain: Secondary | ICD-10-CM

## 2019-08-29 MED ORDER — PREDNISONE 10 MG (21) PO TBPK: ORAL_TABLET | Freq: Every day | ORAL | 0 refills | Status: DC

## 2019-08-29 NOTE — ED Provider Notes (Signed)
[name]    CSN: [id number] Arrival date & time: 08/29/19  1436      History   Chief Complaint Chief Complaint  Patient presents with  . Back Pain    HPI John Delacruz is a 58 y.o. male.   58 year old male comes in for 2 to 3-day history of acute on chronic right lower back pain.  Patient states has had right lower back pain intermittently for the past 7 to 9 years.  He has seen orthopedics for evaluation, and has been told about degenerative disc disease.  States he has a flareup every 1 to 2 years, and usually takes a prednisone pack for it.  He notices that when he regularly exercises, it decreases to frequency of flareups.  He denies any new injury or trauma.  States has had decreased exercise due to COVID pandemic. He denies radiation of pain, urinary symptoms. He has been using otc medication, warm compress, back brace without relief.      Past Medical History:  Diagnosis Date  . Hypertension     Patient Active Problem List   Diagnosis Date Noted  . Diverticulitis 12/18/2013  . HTN (hypertension) 01/01/2013    Past Surgical History:  Procedure Laterality Date  . FRACTURE SURGERY    . HERNIA REPAIR         Home Medications    Prior to Admission medications   Medication Sig Start Date End Date Taking? Authorizing Provider  amLODipine (NORVASC) 10 MG tablet Take 10 mg by mouth daily.    [provider]  hydrochlorothiazide (MICROZIDE) 12.5 MG capsule Take 12.5 mg by mouth daily.    [provider]  ibuprofen (ADVIL,MOTRIN) 200 MG tablet Take 400 mg by mouth every 6 (six) hours as needed for moderate pain.    [provider]  predniSONE (STERAPRED UNI-PAK 21 TAB) 10 MG (21) TBPK tablet Take by mouth daily. Take 6 tabs by mouth day 1, then 5 tabs, then 4 tabs, then 3 tabs, 2 tabs, then 1 tab for the last day 08/29/19   Belinda Fisher, PA-C    Family History Family History  Problem Relation Age of Onset  . Hypertension  Mother   . Kidney disease Mother   . Lupus Sister     Social History Social History   Tobacco Use  . Smoking status: Current Every Day Smoker    Packs/day: 1.00    Types: Cigarettes  . Smokeless tobacco: Never Used  Substance Use Topics  . Alcohol use: Yes    Alcohol/week: 3.0 - 4.0 standard drinks    Types: 3 - 4 Cans of beer per week    Comment: daily  . Drug use: No     Allergies   Lisinopril   Review of Systems Review of Systems  Reason unable to perform ROS: See HPI as above.     Physical Exam Triage Vital Signs ED Triage Vitals [08/29/19 1452]  Enc Vitals Group     BP (!) 164/109     Pulse Rate 65     Resp 18     Temp 97.7 F (36.5 C)     Temp Source Temporal     SpO2 98 %     Weight      Height      Head Circumference      Peak Flow      Pain Score 8     Pain Loc      Pain Edu?  Excl. in [place]?    No data found.  Updated Vital Signs BP (!) 164/109 (BP Location: Right Arm)   Pulse 65   Temp 97.7 F (36.5 C) (Temporal)   Resp 18   SpO2 98%   Physical Exam Constitutional:      General: He is not in acute distress.    Appearance: He is well-developed. He is not diaphoretic.  HENT:     Head: Normocephalic and atraumatic.  Eyes:     Conjunctiva/sclera: Conjunctivae normal.     Pupils: Pupils are equal, round, and reactive to light.  Cardiovascular:     Rate and Rhythm: Normal rate and regular rhythm.     Heart sounds: Normal heart sounds. No murmur. No friction rub. No gallop.   Pulmonary:     Effort: Pulmonary effort is normal. No accessory muscle usage or respiratory distress.     Breath sounds: Normal breath sounds. No stridor. No decreased breath sounds, wheezing, rhonchi or rales.  Musculoskeletal:     Comments: Patient wearing back brace with warm compress.  No tenderness on palpation of the spinous processes.  No tenderness to palpation of bilateral back.  Range of motion of back triggers pain, but full.  Negative straight leg  raise.  Skin:    General: Skin is warm and dry.  Neurological:     Mental Status: He is alert and oriented to person, place, and time.      UC Treatments / Results  Labs (all labs ordered are listed, but only abnormal results are displayed) Labs Reviewed - No data to display  EKG   Radiology No results found.  Procedures Procedures (including critical care time)  Medications Ordered in UC Medications - No data to display  Initial Impression / Assessment and Plan / UC Course  I have reviewed the triage vital signs and the nursing notes.  Pertinent labs & imaging results that were available during my care of the patient were reviewed by me and considered in my medical decision making (see chart for details).    Start prednisone as directed. Patient to continue symptomatic as directed. Discussed if flare ups become more frequent, may need reevaluation with orthopedics. Return precautions given.  Final Clinical Impressions(s) / UC Diagnoses   Final diagnoses:  Chronic right-sided low back pain without sciatica   ED Prescriptions    Medication Sig Dispense Auth. Provider   predniSONE (STERAPRED UNI-PAK 21 TAB) 10 MG (21) TBPK tablet Take by mouth daily. Take 6 tabs by mouth day 1, then 5 tabs, then 4 tabs, then 3 tabs, 2 tabs, then 1 tab for the last day 21 tablet Ok Edwards, PA-C     PDMP not reviewed this encounter.   Ok Edwards, PA-C 08/29/19 1525

## 2019-08-29 NOTE — Discharge Instructions (Signed)
Start prednisone as directed. Continue symptomatic relief, heat compress. Follow up with PCP for reevaluation if more frequent flare ups.

## 2019-08-29 NOTE — ED Triage Notes (Signed)
Pt presents to [facility] for assessment of 2-3 days of flair up chronic lower mid/right back pain.  Patient states he has this happen once a year, and usually needs a prednisone pack.

## 2020-05-12 ENCOUNTER — Ambulatory Visit
Admission: EM | Admit: 2020-05-12 | Discharge: 2020-05-12 | Disposition: A | Discharge status: Home / Self Care | Payer: 59 | Attending: Physician Assistant | Admitting: Physician Assistant

## 2020-05-12 ENCOUNTER — Encounter: Payer: Self-pay | Admitting: Emergency Medicine

## 2020-05-12 ENCOUNTER — Other Ambulatory Visit: Payer: Self-pay

## 2020-05-12 DIAGNOSIS — M25562 Pain in left knee: Secondary | ICD-10-CM | POA: Diagnosis not present

## 2020-05-12 MED ORDER — PREDNISONE 50 MG PO TABS: 50.0000 mg | ORAL_TABLET | Freq: Every day | ORAL | 0 refills | Status: DC

## 2020-05-12 NOTE — Discharge Instructions (Addendum)
Start prednisone as directed. Continue ice compress, knee sleeve during activity. Follow up with PCP if symptoms not improving.

## 2020-05-12 NOTE — ED Provider Notes (Signed)
[name]    CSN: [id number] Arrival date & time: 05/12/20  9476      History   Chief Complaint Chief Complaint  Patient presents with  . Knee Pain    HPI John Delacruz is a 59 y.o. male.   59 year old male with history of HTN comes in for left knee pain x2 days.  Denies injury/trauma.  States work requires strenuous activity to the knee.  Pain is to the anterior knee with swelling.  Pain is worse with weightbearing, range of motion.  Did ice compress, ibuprofen 200 mg with mild relief of swelling, no relief on pain.     Past Medical History:  Diagnosis Date  . Hypertension     Patient Active Problem List   Diagnosis Date Noted  . Diverticulitis 12/18/2013  . HTN (hypertension) 01/01/2013    Past Surgical History:  Procedure Laterality Date  . FRACTURE SURGERY    . HERNIA REPAIR         Home Medications    Prior to Admission medications   Medication Sig Start Date End Date Taking? Authorizing Provider  amLODipine (NORVASC) 10 MG tablet Take 10 mg by mouth daily.   Yes [provider]  hydrochlorothiazide (MICROZIDE) 12.5 MG capsule Take 12.5 mg by mouth daily.   Yes [provider]  ibuprofen (ADVIL,MOTRIN) 200 MG tablet Take 400 mg by mouth every 6 (six) hours as needed for moderate pain.   Yes [provider]  predniSONE (DELTASONE) 50 MG tablet Take 1 tablet (50 mg total) by mouth daily with breakfast. 05/12/20   Ok Edwards, PA-C    Family History Family History  Problem Relation Age of Onset  . Hypertension Mother   . Kidney disease Mother   . Lupus Sister     Social History Social History   Tobacco Use  . Smoking status: Current Every Day Smoker    Packs/day: 1.00    Types: Cigarettes  . Smokeless tobacco: Never Used  Substance Use Topics  . Alcohol use: Yes    Alcohol/week: 3.0 - 4.0 standard drinks    Types: 3 - 4 Cans of beer per week    Comment: daily  . Drug use: No     Allergies     Lisinopril   Review of Systems Review of Systems  Reason unable to perform ROS: See HPI as above.     Physical Exam Triage Vital Signs ED Triage Vitals  Enc Vitals Group     BP 05/12/20 1058 (!) 187/97     Pulse Rate 05/12/20 1058 (!) 55     Resp 05/12/20 1058 18     Temp 05/12/20 1058 98.1 F (36.7 C)     Temp Source 05/12/20 1058 Oral     SpO2 05/12/20 1058 100 %     Weight --      Height --      Head Circumference --      Peak Flow --      Pain Score 05/12/20 1056 8     Pain Loc --      Pain Edu? --      Excl. in [place]? --    No data found.  Updated Vital Signs BP (!) 187/97 (BP Location: Left Arm) Comment (BP Location): has not had blood pressure medicine today  Pulse (!) 55   Temp 98.1 F (36.7 C) (Oral)   Resp 18   SpO2 100%   Physical Exam Constitutional:  General: He is not in acute distress.    Appearance: Normal appearance. He is well-developed. He is not toxic-appearing or diaphoretic.  HENT:     Head: Normocephalic and atraumatic.  Eyes:     Conjunctiva/sclera: Conjunctivae normal.     Pupils: Pupils are equal, round, and reactive to light.  Pulmonary:     Effort: Pulmonary effort is normal. No respiratory distress.     Comments: Speaking in full sentences without difficulty Musculoskeletal:     Cervical back: Normal range of motion and neck supple.     Comments: No obvious swelling, erythema, warmth, contusion. Tender to palpation of the patellar. No joint-line tenderness. Full ROM of the knee. No obvious crepitus. Strength 5/5. Sensation intact.   Skin:    General: Skin is warm and dry.  Neurological:     Mental Status: He is alert and oriented to person, place, and time.    UC Treatments / Results  Labs (all labs ordered are listed, but only abnormal results are displayed) Labs Reviewed - No data to display  EKG   Radiology No results found.  Procedures Procedures (including critical care time)  Medications Ordered in  UC Medications - No data to display  Initial Impression / Assessment and Plan / UC Course  I have reviewed the triage vital signs and the nursing notes.  Pertinent labs & imaging results that were available during my care of the patient were reviewed by me and considered in my medical decision making (see chart for details).    Atraumatic left knee pain. ?  Bursitis versus arthritis.  Patient would like to try prednisone versus NSAIDs.  Will provide short course of prednisone.  Ice compress, knee sleeve during activity.  Return precautions given.  Patient expresses understanding and agrees to plan.  Final Clinical Impressions(s) / UC Diagnoses   Final diagnoses:  Acute pain of left knee   ED Prescriptions    Medication Sig Dispense Auth. Provider   predniSONE (DELTASONE) 50 MG tablet Take 1 tablet (50 mg total) by mouth daily with breakfast. 5 tablet Belinda Fisher, PA-C     PDMP not reviewed this encounter.   Belinda Fisher, PA-C 05/12/20 1139

## 2020-05-12 NOTE — ED Triage Notes (Addendum)
Patient was on his knees a lot yesterday and this is not uncommon for the type of work he does.  Patient reports knee pain is similar to prior episodes.  Patient reports arthritis.  Pain in left knee, top of knee.  Pain with and without weight bearing, worse with weight bearing.  Pain started last night around 7 pm.  Pain worse this morning, knee swollen

## 2020-09-10 ENCOUNTER — Ambulatory Visit
Admission: EM | Admit: 2020-09-10 | Discharge: 2020-09-10 | Disposition: A | Discharge status: Home / Self Care | Payer: 59 | Attending: Physician Assistant | Admitting: Physician Assistant

## 2020-09-10 ENCOUNTER — Other Ambulatory Visit: Payer: Self-pay

## 2020-09-10 DIAGNOSIS — M25562 Pain in left knee: Secondary | ICD-10-CM

## 2020-09-10 DIAGNOSIS — G8929 Other chronic pain: Secondary | ICD-10-CM

## 2020-09-10 MED ORDER — MELOXICAM 7.5 MG PO TABS: 7.5000 mg | ORAL_TABLET | Freq: Every day | ORAL | 0 refills | Status: DC

## 2020-09-10 MED ORDER — PREDNISONE 50 MG PO TABS: 50.0000 mg | ORAL_TABLET | Freq: Every day | ORAL | 0 refills | Status: DC

## 2020-09-10 NOTE — Discharge Instructions (Signed)
Start Mobic. Do not take ibuprofen (motrin/advil)/ naproxen (aleve) while on mobic. If symptoms not improving in 2-3 days, you can switch to prednisone instead. If symptoms improving with mobic, do not use prednisone. Can also add on over the counter voltaren gel if needed. Follow up with PCP for further evaluation if symptoms does not improve

## 2020-09-10 NOTE — ED Provider Notes (Signed)
[name]    CSN: [id number] Arrival date & time: 09/10/20  0808      History   Chief Complaint Chief Complaint  Patient presents with  . Knee Pain    HPI John Delacruz is a 59 y.o. male.   59 year old male comes in for 3 day history of left knee pain from arthritis. This flares up intermittently, no obvious precipitating factor this time. Pain to the anterior knee without significant swelling. Takes ibuprofen QD with mild relief. Usually takes a course of prednisone with flare ups.      Past Medical History:  Diagnosis Date  . Hypertension     Patient Active Problem List   Diagnosis Date Noted  . Diverticulitis 12/18/2013  . HTN (hypertension) 01/01/2013    Past Surgical History:  Procedure Laterality Date  . FRACTURE SURGERY    . HERNIA REPAIR         Home Medications    Prior to Admission medications   Medication Sig Start Date End Date Taking? Authorizing Provider  amLODipine (NORVASC) 10 MG tablet Take 10 mg by mouth daily.    [provider]  hydrochlorothiazide (MICROZIDE) 12.5 MG capsule Take 12.5 mg by mouth daily.    [provider]  ibuprofen (ADVIL,MOTRIN) 200 MG tablet Take 400 mg by mouth every 6 (six) hours as needed for moderate pain.    [provider]  meloxicam (MOBIC) 7.5 MG tablet Take 1 tablet (7.5 mg total) by mouth daily. 09/10/20   Cathie Hoops, Marlane Hirschmann V, PA-C  predniSONE (DELTASONE) 50 MG tablet Take 1 tablet (50 mg total) by mouth daily with breakfast. 09/10/20   Belinda Fisher, PA-C    Family History Family History  Problem Relation Age of Onset  . Hypertension Mother   . Kidney disease Mother   . Lupus Sister     Social History Social History   Tobacco Use  . Smoking status: Current Every Day Smoker    Packs/day: 1.00    Types: Cigarettes  . Smokeless tobacco: Never Used  Substance Use Topics  . Alcohol use: Yes    Alcohol/week: 3.0 - 4.0 standard drinks    Types: 3 - 4 Cans of beer per  week    Comment: daily  . Drug use: No     Allergies   Lisinopril   Review of Systems Review of Systems  Reason unable to perform ROS: See HPI as above.     Physical Exam Triage Vital Signs ED Triage Vitals [09/10/20 0825]  Enc Vitals Group     BP (!) 169/93     Pulse Rate 63     Resp 18     Temp 98.6 F (37 C)     Temp Source Oral     SpO2 97 %     Weight      Height      Head Circumference      Peak Flow      Pain Score 6     Pain Loc      Pain Edu?      Excl. in [place]?    No data found.  Updated Vital Signs BP (!) 169/93 (BP Location: Left Arm)   Pulse 63   Temp 98.6 F (37 C) (Oral)   Resp 18   SpO2 97%   Physical Exam Constitutional:      General: He is not in acute distress.    Appearance: Normal appearance. He is well-developed. He is  not toxic-appearing or diaphoretic.  HENT:     Head: Normocephalic and atraumatic.  Eyes:     Conjunctiva/sclera: Conjunctivae normal.     Pupils: Pupils are equal, round, and reactive to light.  Pulmonary:     Effort: Pulmonary effort is normal. No respiratory distress.  Musculoskeletal:     Cervical back: Normal range of motion and neck supple.     Comments: No swelling, erythema, warmth, contusion. Tenderness to the knee when pressing on patellar. Full ROM without obvious crepitus. Strength and sensation intact.   Skin:    General: Skin is warm and dry.  Neurological:     Mental Status: He is alert and oriented to person, place, and time.      UC Treatments / Results  Labs (all labs ordered are listed, but only abnormal results are displayed) Labs Reviewed - No data to display  EKG   Radiology No results found.  Procedures Procedures (including critical care time)  Medications Ordered in UC Medications - No data to display  Initial Impression / Assessment and Plan / UC Course  I have reviewed the triage vital signs and the nursing notes.  Pertinent labs & imaging results that were available  during my care of the patient were reviewed by me and considered in my medical decision making (see chart for details).    Last prednisone course 4 months ago. Patient hesitant to do ibuprofen more often due to worries of GI upset. Will trial mobic at this time. Rx of prednisone called into pharmacy as well. If unable to tolerate mobic, or without significant improvement of symptoms, can do course of prednisone. Return precautions given.  Final Clinical Impressions(s) / UC Diagnoses   Final diagnoses:  Chronic pain of left knee    ED Prescriptions    Medication Sig Dispense Auth. Provider   meloxicam (MOBIC) 7.5 MG tablet Take 1 tablet (7.5 mg total) by mouth daily. 14 tablet Estelle Skibicki V, PA-C   predniSONE (DELTASONE) 50 MG tablet Take 1 tablet (50 mg total) by mouth daily with breakfast. 5 tablet Belinda Fisher, PA-C     PDMP not reviewed this encounter.   Belinda Fisher, PA-C 09/10/20 [phone]

## 2020-09-10 NOTE — ED Triage Notes (Signed)
Pt c/o lt knee pain x3 days. Denies injury. States hx of arthritis and this feels the same. States taking ibuprofen with some relief but usually needs prednisone to clear it up.

## 2020-09-19 ENCOUNTER — Ambulatory Visit
Admission: EM | Admit: 2020-09-19 | Discharge: 2020-09-19 | Disposition: A | Discharge status: Home / Self Care | Payer: 59 | Attending: Physician Assistant | Admitting: Physician Assistant

## 2020-09-19 ENCOUNTER — Other Ambulatory Visit: Payer: Self-pay

## 2020-09-19 DIAGNOSIS — L0291 Cutaneous abscess, unspecified: Secondary | ICD-10-CM | POA: Diagnosis not present

## 2020-09-19 MED ORDER — CEPHALEXIN 500 MG PO CAPS: 500.0000 mg | ORAL_CAPSULE | Freq: Four times a day (QID) | ORAL | 0 refills | Status: DC

## 2020-09-19 NOTE — Discharge Instructions (Signed)
Keflex as directed. Warm compress as directed. If pain/swelling worsens, will need drainage.

## 2020-09-19 NOTE — ED Provider Notes (Signed)
[name]    CSN: [id number] Arrival date & time: 09/19/20  1306      History   Chief Complaint Chief Complaint  Patient presents with  . Wound Check    upper back x 3 days    HPI John Delacruz is a 59 y.o. male.   59 year old male comes in for 3 day history of possible abscess/cyst to the mid upper back. Has had similar in the past that required I&D. States he wears suspenders and felt irritation to the area and noted swelling. Denies spreading erythema, warmth, fever.      Past Medical History:  Diagnosis Date  . Hypertension     Patient Active Problem List   Diagnosis Date Noted  . Diverticulitis 12/18/2013  . HTN (hypertension) 01/01/2013    Past Surgical History:  Procedure Laterality Date  . FRACTURE SURGERY    . HERNIA REPAIR         Home Medications    Prior to Admission medications   Medication Sig Start Date End Date Taking? Authorizing Provider  amLODipine (NORVASC) 10 MG tablet Take 10 mg by mouth daily.   Yes [provider]  hydrochlorothiazide (MICROZIDE) 12.5 MG capsule Take 12.5 mg by mouth daily.   Yes [provider]  ibuprofen (ADVIL,MOTRIN) 200 MG tablet Take 400 mg by mouth every 6 (six) hours as needed for moderate pain.   Yes [provider]  meloxicam (MOBIC) 7.5 MG tablet Take 1 tablet (7.5 mg total) by mouth daily. 09/10/20  Yes Sunday Klos V, PA-C  predniSONE (DELTASONE) 50 MG tablet Take 1 tablet (50 mg total) by mouth daily with breakfast. 09/10/20  Yes Melena Hayes V, PA-C  cephALEXin (KEFLEX) 500 MG capsule Take 1 capsule (500 mg total) by mouth 4 (four) times daily. 09/19/20   Belinda Fisher, PA-C    Family History Family History  Problem Relation Age of Onset  . Hypertension Mother   . Kidney disease Mother   . Lupus Sister     Social History Social History   Tobacco Use  . Smoking status: Current Every Day Smoker    Packs/day: 1.00    Types: Cigarettes  . Smokeless tobacco: Never  Used  Vaping Use  . Vaping Use: Never used  Substance Use Topics  . Alcohol use: Yes    Alcohol/week: 3.0 - 4.0 standard drinks    Types: 3 - 4 Cans of beer per week    Comment: daily  . Drug use: No     Allergies   Lisinopril   Review of Systems Review of Systems  Reason unable to perform ROS: See HPI as above.     Physical Exam Triage Vital Signs ED Triage Vitals  Enc Vitals Group     BP 09/19/20 1331 (!) 169/91     Pulse Rate 09/19/20 1331 73     Resp 09/19/20 1331 18     Temp 09/19/20 1331 98 F (36.7 C)     Temp Source 09/19/20 1331 Oral     SpO2 09/19/20 1331 98 %     Weight --      Height --      Head Circumference --      Peak Flow --      Pain Score 09/19/20 1342 0     Pain Loc --      Pain Edu? --      Excl. in [place]? --    No data found.  Updated  Vital Signs BP (!) 169/91 (BP Location: Left Arm) Comment: pt sts has not hat BP medication today  Pulse 73   Temp 98 F (36.7 C) (Oral)   Resp 18   SpO2 98%   Physical Exam Constitutional:      General: He is not in acute distress.    Appearance: Normal appearance. He is well-developed. He is not toxic-appearing or diaphoretic.  HENT:     Head: Normocephalic and atraumatic.  Eyes:     Conjunctiva/sclera: Conjunctivae normal.     Pupils: Pupils are equal, round, and reactive to light.  Pulmonary:     Effort: Pulmonary effort is normal. No respiratory distress.  Musculoskeletal:     Cervical back: Normal range of motion and neck supple.  Skin:    General: Skin is warm and dry.     Comments: 1.5cm fluctuance with minimal overlaying erythema. No warmth. No significant tenderness. Some surrounding induration.   Neurological:     Mental Status: He is alert and oriented to person, place, and time.    UC Treatments / Results  Labs (all labs ordered are listed, but only abnormal results are displayed) Labs Reviewed - No data to display  EKG   Radiology No results  found.  Procedures Procedures (including critical care time)  Medications Ordered in UC Medications - No data to display  Initial Impression / Assessment and Plan / UC Course  I have reviewed the triage vital signs and the nursing notes.  Pertinent labs & imaging results that were available during my care of the patient were reviewed by me and considered in my medical decision making (see chart for details).    Patient would like to defer I&D at this time. Will start keflex, warm compresses. Discussed may still need I&D if symptoms not improving. Return precautions given.  Final Clinical Impressions(s) / UC Diagnoses   Final diagnoses:  Abscess   ED Prescriptions    Medication Sig Dispense Auth. Provider   cephALEXin (KEFLEX) 500 MG capsule Take 1 capsule (500 mg total) by mouth 4 (four) times daily. 28 capsule Belinda Fisher, PA-C     PDMP not reviewed this encounter.   Belinda Fisher, PA-C 09/19/20 1356

## 2020-09-19 NOTE — ED Triage Notes (Signed)
Pt states he noticed the wound/possible abscess on his back about 3 days ago. Pt has some skin discoloration between his shoulder blades and a small hard area under the skin. Pt is aox4 and ambulatory.

## 2020-12-24 ENCOUNTER — Ambulatory Visit
Admission: EM | Admit: 2020-12-24 | Discharge: 2020-12-24 | Disposition: A | Discharge status: Home / Self Care | Payer: 59 | Attending: Family Medicine | Admitting: Family Medicine

## 2020-12-24 ENCOUNTER — Other Ambulatory Visit: Payer: Self-pay

## 2020-12-24 DIAGNOSIS — G8929 Other chronic pain: Secondary | ICD-10-CM | POA: Diagnosis not present

## 2020-12-24 DIAGNOSIS — M545 Low back pain, unspecified: Secondary | ICD-10-CM

## 2020-12-24 MED ORDER — TIZANIDINE HCL 4 MG PO TABS: 4.0000 mg | ORAL_TABLET | Freq: Four times a day (QID) | ORAL | 0 refills | Status: DC | PRN

## 2020-12-24 MED ORDER — PREDNISONE 20 MG PO TABS: ORAL_TABLET | ORAL | 0 refills | Status: DC

## 2020-12-24 NOTE — ED Provider Notes (Signed)
[name]    CSN: [id number] Arrival date & time: 12/24/20  0816      History   Chief Complaint Chief Complaint  Patient presents with  . Back Pain    HPI John Delacruz is a 60 y.o. male.   HPI  Patient presents today with bilateral back pain x 4 days. Denies injury. History of recurrent back pain and chronic knee pain.  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  Denies any incontinence episodes.  No radiation of pain into his lower extremities.  He is fully ambulatory.  Past Medical History:  Diagnosis Date  . Hypertension     Patient Active Problem List   Diagnosis Date Noted  . Diverticulitis 12/18/2013  . HTN (hypertension) 01/01/2013    Past Surgical History:  Procedure Laterality Date  . FRACTURE SURGERY    . HERNIA REPAIR         Home Medications    Prior to Admission medications   Medication Sig Start Date End Date Taking? Authorizing Provider  amLODipine (NORVASC) 10 MG tablet Take 10 mg by mouth daily.    [provider]  hydrochlorothiazide (MICROZIDE) 12.5 MG capsule Take 12.5 mg by mouth daily.    [provider]    Family History Family History  Problem Relation Age of Onset  . Hypertension Mother   . Kidney disease Mother   . Lupus Sister     Social History Social History   Tobacco Use  . Smoking status: Current Every Day Smoker    Packs/day: 1.00    Types: Cigarettes  . Smokeless tobacco: Never Used  Vaping Use  . Vaping Use: Never used  Substance Use Topics  . Alcohol use: Yes    Alcohol/week: 3.0 - 4.0 standard drinks    Types: 3 - 4 Cans of beer per week    Comment: daily  . Drug use: No     Allergies   Lisinopril   Review of Systems Review of Systems Pertinent negatives listed in HPI  Physical Exam Triage Vital Signs ED Triage Vitals [12/24/20 0831]  Enc Vitals Group     BP (!) 164/94     Pulse Rate 61     Resp 18     Temp  97.7 F (36.5 C)     Temp Source Oral     SpO2 97 %     Weight      Height      Head Circumference      Peak Flow      Pain Score 9     Pain Loc      Pain Edu?      Excl. in GC?    No data found.  Updated Vital Signs BP (!) 164/94 (BP Location: Left Arm)   Pulse 61   Temp 97.7 F (36.5 C) (Oral)   Resp 18   SpO2 97%   Visual Acuity Right Eye Distance:   Left Eye Distance:   Bilateral Distance:    Right Eye Near:   Left Eye Near:    Bilateral Near:     Physical Exam Vital signs as noted above.  General appearance: Alert, Cooperative, No distress Head: Normocephalic, without obvious abnormality, atraumatic Heart: Rate and rhythm normal. No gallop or murmurs noted on exam  Respiratory: Respirations even and unlabored, normal respiratory rate Back Exam: Patient appears to be in mild to moderate pain, antalgic gait noted. Limited ROM with movement  of lumbar  Neurologic:A&Ox 3, symmetric movements. Normal coordination. Skin: Skin color, texture, turgor normal. No rashes seen  Psych: Appropriate mood and affect.  UC Treatments / Results  Labs (all labs ordered are listed, but only abnormal results are displayed) Labs Reviewed - No data to display  EKG   Radiology No results found.  Procedures Procedures (including critical care time)  Medications Ordered in UC Medications - No data to display  Initial Impression / Assessment and Plan / UC Course  I have reviewed the triage vital signs and the nursing notes.  Pertinent labs & imaging results that were available during my care of the patient were reviewed by me and considered in my medical decision making (see chart for details).   Patient presents with recurrent chronic back pain without known injury.  Treating with prednisone to reduce inflammation resolve pain.  For acute pain Zanaflex.  Patient given precautions avoid driving while taking the muscle relaxer.  Final Clinical Impressions(s) / UC Diagnoses    Final diagnoses:  Chronic bilateral low back pain without sciatica   Discharge Instructions   None    ED Prescriptions    Medication Sig Dispense Auth. Provider   predniSONE (DELTASONE) 20 MG tablet Take 3 PO QAM x 2 days, 2 PO QAM x 4 days, 1 PO QAM x 3 days 18 tablet Bing Neighbors, FNP   tiZANidine (ZANAFLEX) 4 MG tablet Take 1 tablet (4 mg total) by mouth every 6 (six) hours as needed for muscle spasms. 30 tablet Bing Neighbors, FNP     PDMP not reviewed this encounter.   Bing Neighbors, [place] 12/24/20 [phone]

## 2020-12-24 NOTE — ED Triage Notes (Signed)
Pt c/o lower back pain since Monday. Denies injury. Denies pain radiating.

## 2021-01-15 ENCOUNTER — Encounter: Payer: Self-pay | Admitting: Emergency Medicine

## 2021-01-15 ENCOUNTER — Other Ambulatory Visit: Payer: Self-pay

## 2021-01-15 ENCOUNTER — Ambulatory Visit
Admission: EM | Admit: 2021-01-15 | Discharge: 2021-01-15 | Disposition: A | Discharge status: Home / Self Care | Payer: 59 | Attending: Family Medicine | Admitting: Family Medicine

## 2021-01-15 DIAGNOSIS — M25562 Pain in left knee: Secondary | ICD-10-CM

## 2021-01-15 DIAGNOSIS — G8929 Other chronic pain: Secondary | ICD-10-CM

## 2021-01-15 MED ORDER — PREDNISONE 20 MG PO TABS: 40.0000 mg | ORAL_TABLET | Freq: Every day | ORAL | 0 refills | Status: DC

## 2021-01-15 NOTE — ED Triage Notes (Signed)
Pt here for left knee pain that is chronic in nature starting hurting last Saturday; denies new injury; pt sts normally takes prednisone for pain when happens

## 2021-01-15 NOTE — ED Provider Notes (Signed)
[name]    CSN: [id number] Arrival date & time: 01/15/21  6734      History   Chief Complaint Chief Complaint  Patient presents with  . Knee Pain    HPI John Delacruz is a 60 y.o. male.   Here today with 4 day history of acute on chronic left knee pain. States long hx of osteoarthritis in the knee and he works a very physical job which seems to flare the knee up intermittently usually every several months. Has had excellent benefit with prednisone when this happens. Takes ibuprofen prn for pain relief which does help but he states if he takes too often will have GI upset. Denies redness, swelling, mobility issues, numbness, tingling. No new injury. Used to be followed by Orthopedics but has not been in years.      Past Medical History:  Diagnosis Date  . Hypertension     Patient Active Problem List   Diagnosis Date Noted  . Diverticulitis 12/18/2013  . HTN (hypertension) 01/01/2013    Past Surgical History:  Procedure Laterality Date  . FRACTURE SURGERY    . HERNIA REPAIR         Home Medications    Prior to Admission medications   Medication Sig Start Date End Date Taking? Authorizing Provider  predniSONE (DELTASONE) 20 MG tablet Take 2 tablets (40 mg total) by mouth daily with breakfast. 01/15/21  Yes Particia Nearing, PA-C  amLODipine (NORVASC) 10 MG tablet Take 10 mg by mouth daily.    [provider]  hydrochlorothiazide (MICROZIDE) 12.5 MG capsule Take 12.5 mg by mouth daily.    [provider]  tiZANidine (ZANAFLEX) 4 MG tablet Take 1 tablet (4 mg total) by mouth every 6 (six) hours as needed for muscle spasms. 12/24/20   Bing Neighbors, FNP    Family History Family History  Problem Relation Age of Onset  . Hypertension Mother   . Kidney disease Mother   . Lupus Sister     Social History Social History   Tobacco Use  . Smoking status: Current Every Day Smoker    Packs/day: 1.00    Types: Cigarettes   . Smokeless tobacco: Never Used  Vaping Use  . Vaping Use: Never used  Substance Use Topics  . Alcohol use: Yes    Alcohol/week: 3.0 - 4.0 standard drinks    Types: 3 - 4 Cans of beer per week    Comment: daily  . Drug use: No     Allergies   Lisinopril   Review of Systems Review of Systems PER HPI   Physical Exam Triage Vital Signs ED Triage Vitals  Enc Vitals Group     BP 01/15/21 0829 (!) 181/105     Pulse Rate 01/15/21 0829 75     Resp 01/15/21 0829 18     Temp 01/15/21 0829 98.2 F (36.8 C)     Temp Source 01/15/21 0829 Oral     SpO2 01/15/21 0829 96 %     Weight --      Height --      Head Circumference --      Peak Flow --      Pain Score 01/15/21 0830 7     Pain Loc --      Pain Edu? --      Excl. in GC? --    No data found.  Updated Vital Signs BP (!) 181/105 (BP Location: Right Arm)   Pulse 75  Temp 98.2 F (36.8 C) (Oral)   Resp 18   SpO2 96%   Visual Acuity Right Eye Distance:   Left Eye Distance:   Bilateral Distance:    Right Eye Near:   Left Eye Near:    Bilateral Near:     Physical Exam Vitals and nursing note reviewed.  Constitutional:      Appearance: Normal appearance.  HENT:     Head: Atraumatic.  Eyes:     Extraocular Movements: Extraocular movements intact.     Conjunctiva/sclera: Conjunctivae normal.  Cardiovascular:     Rate and Rhythm: Normal rate and regular rhythm.     Pulses: Normal pulses.  Pulmonary:     Effort: Pulmonary effort is normal.     Breath sounds: Normal breath sounds.  Musculoskeletal:        General: Tenderness (anterior left knee ttp) present. No swelling, deformity or signs of injury. Normal range of motion.     Cervical back: Normal range of motion and neck supple.  Skin:    General: Skin is warm and dry.     Findings: No erythema.  Neurological:     General: No focal deficit present.     Mental Status: He is oriented to person, place, and time.     Sensory: No sensory deficit.      Motor: No weakness.     Gait: Gait abnormal (mildly antalgic gait).  Psychiatric:        Mood and Affect: Mood normal.        Thought Content: Thought content normal.        Judgment: Judgment normal.     UC Treatments / Results  Labs (all labs ordered are listed, but only abnormal results are displayed) Labs Reviewed - No data to display  EKG   Radiology No results found.  Procedures Procedures (including critical care time)  Medications Ordered in UC Medications - No data to display  Initial Impression / Assessment and Plan / UC Course  I have reviewed the triage vital signs and the nursing notes.  Pertinent labs & imaging results that were available during my care of the patient were reviewed by me and considered in my medical decision making (see chart for details).     Acute on chronic issue. Will treat with prednisone burst, continued OTC pain relievers prn, topical voltaren gel, and recommended Orthopedic f/u for longer term management of pain. Continue tumeric supplements.   Final Clinical Impressions(s) / UC Diagnoses   Final diagnoses:  Chronic pain of left knee   Discharge Instructions   None    ED Prescriptions    Medication Sig Dispense Auth. Provider   predniSONE (DELTASONE) 20 MG tablet Take 2 tablets (40 mg total) by mouth daily with breakfast. 10 tablet Particia Nearing, [place]     PDMP not reviewed this encounter.   Roosvelt Maser [place], [place] 01/15/21 [phone]

## 2021-04-05 ENCOUNTER — Telehealth: Payer: Self-pay | Admitting: Emergency Medicine

## 2021-04-05 ENCOUNTER — Encounter: Payer: Self-pay | Admitting: Emergency Medicine

## 2021-04-05 ENCOUNTER — Other Ambulatory Visit: Payer: Self-pay

## 2021-04-05 ENCOUNTER — Ambulatory Visit
Admission: EM | Admit: 2021-04-05 | Discharge: 2021-04-05 | Disposition: A | Discharge status: Home / Self Care | Payer: 59 | Attending: Physician Assistant | Admitting: Physician Assistant

## 2021-04-05 DIAGNOSIS — U071 COVID-19: Secondary | ICD-10-CM | POA: Diagnosis not present

## 2021-04-05 MED ORDER — ALBUTEROL SULFATE HFA 108 (90 BASE) MCG/ACT IN AERS: 1.0000 | INHALATION_SPRAY | Freq: Four times a day (QID) | RESPIRATORY_TRACT | 0 refills | Status: DC | PRN

## 2021-04-05 NOTE — Telephone Encounter (Signed)
Spoke with patient regarding needed to be seen before any medications can be prescribed. Pt states that he was on his way to UC to be seen. Nothing further needed

## 2021-04-05 NOTE — ED Triage Notes (Signed)
Pt is present today with a positive Covid test. Pt states that he has chest congestion and cough. Pt denies any fever

## 2021-04-05 NOTE — Discharge Instructions (Addendum)
Recommend Mucinex with decongestant Recommend Flonase Use albuterol inhaler as needed Return for evaluation if you develop shortness of breath or difficulty breathing

## 2021-04-05 NOTE — ED Provider Notes (Signed)
[name]    CSN: [id number] Arrival date & time: 04/05/21  1219      History   Chief Complaint Chief Complaint  Patient presents with  . Cough    Congestion    HPI John Delacruz is a 60 y.o. male.   Pt tested positive for COVID about one week ago.  Reports he has experienced congestion, mild intermittent cough.  He denies fever, chills, shortness of breath, n/v/d.  He is taking nothing for his sx.  Reports he would like something to break up the congestion.       Past Medical History:  Diagnosis Date  . Hypertension     Patient Active Problem List   Diagnosis Date Noted  . Diverticulitis 12/18/2013  . HTN (hypertension) 01/01/2013    Past Surgical History:  Procedure Laterality Date  . FRACTURE SURGERY    . HERNIA REPAIR         Home Medications    Prior to Admission medications   Medication Sig Start Date End Date Taking? Authorizing Provider  albuterol (VENTOLIN HFA) 108 (90 Base) MCG/ACT inhaler Inhale 1-2 puffs into the lungs every 6 (six) hours as needed for wheezing or shortness of breath. 04/05/21  Yes Leta Bucklin, PA-C  amLODipine (NORVASC) 10 MG tablet Take 10 mg by mouth daily.    [provider]  hydrochlorothiazide (MICROZIDE) 12.5 MG capsule Take 12.5 mg by mouth daily.    [provider]  predniSONE (DELTASONE) 20 MG tablet Take 2 tablets (40 mg total) by mouth daily with breakfast. 01/15/21   Particia Nearing, PA-C  tiZANidine (ZANAFLEX) 4 MG tablet Take 1 tablet (4 mg total) by mouth every 6 (six) hours as needed for muscle spasms. 12/24/20   Bing Neighbors, FNP    Family History Family History  Problem Relation Age of Onset  . Hypertension Mother   . Kidney disease Mother   . Lupus Sister     Social History Social History   Tobacco Use  . Smoking status: Current Every Day Smoker    Packs/day: 1.00    Types: Cigarettes  . Smokeless tobacco: Never Used  Vaping Use  . Vaping Use: Never  used  Substance Use Topics  . Alcohol use: Yes    Alcohol/week: 3.0 - 4.0 standard drinks    Types: 3 - 4 Cans of beer per week    Comment: daily  . Drug use: No     Allergies   Lisinopril   Review of Systems Review of Systems  Constitutional: Negative for chills and fever.  HENT: Positive for congestion. Negative for ear pain and sore throat.   Eyes: Negative for pain and visual disturbance.  Respiratory: Positive for cough. Negative for shortness of breath and wheezing.   Cardiovascular: Negative for chest pain and palpitations.  Gastrointestinal: Negative for abdominal pain and vomiting.  Genitourinary: Negative for dysuria and hematuria.  Musculoskeletal: Negative for arthralgias and back pain.  Skin: Negative for color change and rash.  Neurological: Negative for seizures and syncope.  All other systems reviewed and are negative.    Physical Exam Triage Vital Signs ED Triage Vitals  Enc Vitals Group     BP 04/05/21 1346 129/82     Pulse Rate 04/05/21 1346 74     Resp 04/05/21 1346 17     Temp 04/05/21 1346 97.8 F (36.6 C)     Temp Source 04/05/21 1346 Oral     SpO2 04/05/21 1346 95 %  Weight --      Height --      Head Circumference --      Peak Flow --      Pain Score 04/05/21 1345 6     Pain Loc --      Pain Edu? --      Excl. in GC? --    No data found.  Updated Vital Signs BP 129/82 (BP Location: Left Arm)   Pulse 74   Temp 97.8 F (36.6 C) (Oral)   Resp 17   SpO2 95%   Visual Acuity Right Eye Distance:   Left Eye Distance:   Bilateral Distance:    Right Eye Near:   Left Eye Near:    Bilateral Near:     Physical Exam Vitals and nursing note reviewed.  Constitutional:      Appearance: He is well-developed.  HENT:     Head: Normocephalic and atraumatic.  Eyes:     Conjunctiva/sclera: Conjunctivae normal.  Cardiovascular:     Rate and Rhythm: Normal rate and regular rhythm.     Heart sounds: No murmur heard.   Pulmonary:      Effort: Pulmonary effort is normal. No respiratory distress.     Breath sounds: Normal breath sounds.  Abdominal:     Palpations: Abdomen is soft.     Tenderness: There is no abdominal tenderness.  Musculoskeletal:     Cervical back: Neck supple.  Skin:    General: Skin is warm and dry.  Neurological:     Mental Status: He is alert.      UC Treatments / Results  Labs (all labs ordered are listed, but only abnormal results are displayed) Labs Reviewed - No data to display  EKG   Radiology No results found.  Procedures Procedures (including critical care time)  Medications Ordered in UC Medications - No data to display  Initial Impression / Assessment and Plan / UC Course  I have reviewed the triage vital signs and the nursing notes.  Pertinent labs & imaging results that were available during my care of the patient were reviewed by me and considered in my medical decision making (see chart for details).     Pt well appearing, vitals WNL, lungs clear on exam.  Recommend Mucinex and Flonase.  Albuterol inhaler prescribed.  Strict return precautions discussed.  Final Clinical Impressions(s) / UC Diagnoses   Final diagnoses:  COVID     Discharge Instructions     Recommend Mucinex with decongestant Recommend Flonase Use albuterol inhaler as needed Return for evaluation if you develop shortness of breath or difficulty breathing   ED Prescriptions    Medication Sig Dispense Auth. Provider   albuterol (VENTOLIN HFA) 108 (90 Base) MCG/ACT inhaler Inhale 1-2 puffs into the lungs every 6 (six) hours as needed for wheezing or shortness of breath. 1 each Jodell Cipro, PA-C     PDMP not reviewed this encounter.   Jodell Cipro, PA-C 04/05/21 1432

## 2021-11-02 ENCOUNTER — Ambulatory Visit
Admission: EM | Admit: 2021-11-02 | Discharge: 2021-11-02 | Disposition: A | Discharge status: Home / Self Care | Payer: 59 | Attending: Internal Medicine | Admitting: Internal Medicine

## 2021-11-02 ENCOUNTER — Other Ambulatory Visit: Payer: Self-pay

## 2021-11-02 DIAGNOSIS — L02212 Cutaneous abscess of back [any part, except buttock]: Secondary | ICD-10-CM | POA: Diagnosis not present

## 2021-11-02 MED ORDER — DOXYCYCLINE HYCLATE 100 MG PO CAPS: 100.0000 mg | ORAL_CAPSULE | Freq: Two times a day (BID) | ORAL | 0 refills | Status: DC

## 2021-11-02 NOTE — ED Provider Notes (Signed)
[name]    CSN: [id number] Arrival date & time: 11/02/21  1542      History   Chief Complaint Chief Complaint  Patient presents with   Abscess    HPI John Delacruz is a 60 y.o. male.   Patient presents with abscess to mid upper back that has been present for approximately 10 days.  Patient reports abscess has been draining a lot on its own.  Patient has history of recurrent abscesses in this location for multiple years.  He was seen previously in urgent care and was prescribed antibiotic with successful treatment.  No I&D was needed.  Patient reports that they typically resolve with antibiotics and warm compresses.  Patient denies any fevers.   Abscess  Past Medical History:  Diagnosis Date   Hypertension     Patient Active Problem List   Diagnosis Date Noted   Diverticulitis 12/18/2013   HTN (hypertension) 01/01/2013    Past Surgical History:  Procedure Laterality Date   FRACTURE SURGERY     HERNIA REPAIR         Home Medications    Prior to Admission medications   Medication Sig Start Date End Date Taking? Authorizing Provider  doxycycline (VIBRAMYCIN) 100 MG capsule Take 1 capsule (100 mg total) by mouth 2 (two) times daily. 11/02/21  Yes Audreana Hancox, Acie Fredrickson, FNP  albuterol (VENTOLIN HFA) 108 (90 Base) MCG/ACT inhaler Inhale 1-2 puffs into the lungs every 6 (six) hours as needed for wheezing or shortness of breath. 04/05/21   Ward, Tylene Fantasia, PA-C  amLODipine (NORVASC) 10 MG tablet Take 10 mg by mouth daily.    [provider]  hydrochlorothiazide (MICROZIDE) 12.5 MG capsule Take 12.5 mg by mouth daily.    [provider]    Family History Family History  Problem Relation Age of Onset   Hypertension Mother    Kidney disease Mother    Lupus Sister     Social History Social History   Tobacco Use   Smoking status: Every Day    Packs/day: 1.00    Types: Cigarettes   Smokeless tobacco: Never  Vaping Use   Vaping Use:  Never used  Substance Use Topics   Alcohol use: Yes    Alcohol/week: 3.0 - 4.0 standard drinks    Types: 3 - 4 Cans of beer per week    Comment: daily   Drug use: No     Allergies   Lisinopril   Review of Systems Review of Systems Per HPI  Physical Exam Triage Vital Signs ED Triage Vitals [11/02/21 1612]  Enc Vitals Group     BP (!) 148/90     Pulse Rate 71     Resp 18     Temp 98.6 F (37 C)     Temp Source Oral     SpO2 94 %     Weight      Height      Head Circumference      Peak Flow      Pain Score 0     Pain Loc      Pain Edu?      Excl. in GC?    No data found.  Updated Vital Signs BP (!) 148/90 (BP Location: Left Arm)    Pulse 71    Temp 98.6 F (37 C) (Oral)    Resp 18    SpO2 94%   Visual Acuity Right Eye Distance:   Left Eye Distance:   Bilateral  Distance:    Right Eye Near:   Left Eye Near:    Bilateral Near:     Physical Exam Constitutional:      General: He is not in acute distress.    Appearance: Normal appearance. He is not toxic-appearing or diaphoretic.  HENT:     Head: Normocephalic and atraumatic.  Eyes:     Extraocular Movements: Extraocular movements intact.     Conjunctiva/sclera: Conjunctivae normal.  Pulmonary:     Effort: Pulmonary effort is normal.  Skin:    General: Skin is warm and dry.     Findings: Abrasion present.     Comments: Patient has approximately 2.5 cm in diameter mildly raised indurated abscess that is currently draining purulent drainage on its own.  Located to right mid upper back.  Neurological:     General: No focal deficit present.     Mental Status: He is alert and oriented to person, place, and time. Mental status is at baseline.  Psychiatric:        Mood and Affect: Mood normal.        Behavior: Behavior normal.        Thought Content: Thought content normal.        Judgment: Judgment normal.     UC Treatments / Results  Labs (all labs ordered are listed, but only abnormal results are  displayed) Labs Reviewed - No data to display  EKG   Radiology No results found.  Procedures Procedures (including critical care time)  Medications Ordered in UC Medications - No data to display  Initial Impression / Assessment and Plan / UC Course  I have reviewed the triage vital signs and the nursing notes.  Pertinent labs & imaging results that were available during my care of the patient were reviewed by me and considered in my medical decision making (see chart for details).     Will treat with doxycycline x10 days.  No I&D indicated at this time as abscess is draining on its own and is indurated.  Recommended patient follow-up with general surgery as this is a recurrent issue for patient.  Patient provided with contact information for general surgery.  No red flags on exam.  Discussed strict return precautions.  Patient verbalized understanding and was agreeable with plan. Final Clinical Impressions(s) / UC Diagnoses   Final diagnoses:  Abscess of back     Discharge Instructions      You were sent an antibiotic to help treat infection of back.  Please use warm compresses as well.  Follow-up with general surgery for further evaluation and management.    ED Prescriptions     Medication Sig Dispense Auth. Provider   doxycycline (VIBRAMYCIN) 100 MG capsule Take 1 capsule (100 mg total) by mouth 2 (two) times daily. 20 capsule Teodora Medici, [place]      PDMP not reviewed this encounter.   Teodora Medici, [place] 11/02/21 [phone]

## 2021-11-02 NOTE — Discharge Instructions (Signed)
You were sent an antibiotic to help treat infection of back.  Please use warm compresses as well.  Follow-up with general surgery for further evaluation and management.

## 2021-11-02 NOTE — ED Triage Notes (Signed)
Pt c/o abscess to center mid back x10 days and started draining a few days ago.

## 2021-11-05 ENCOUNTER — Ambulatory Visit: Admission: EM | Admit: 2021-11-05 | Discharge: 2021-11-05 | Discharge status: Against Medical Advice | Payer: 59

## 2021-11-05 ENCOUNTER — Other Ambulatory Visit: Payer: Self-pay

## 2022-01-21 ENCOUNTER — Other Ambulatory Visit: Payer: Self-pay

## 2022-01-21 ENCOUNTER — Ambulatory Visit
Admission: EM | Admit: 2022-01-21 | Discharge: 2022-01-21 | Disposition: A | Discharge status: Home / Self Care | Payer: 59

## 2022-01-21 DIAGNOSIS — M545 Low back pain, unspecified: Secondary | ICD-10-CM

## 2022-01-21 LAB — POCT URINALYSIS DIP (MANUAL ENTRY)
Bilirubin, UA: NEGATIVE
Blood, UA: NEGATIVE
Glucose, UA: NEGATIVE mg/dL
Ketones, POC UA: NEGATIVE mg/dL
Leukocytes, UA: NEGATIVE
Nitrite, UA: NEGATIVE
Protein Ur, POC: NEGATIVE mg/dL
Spec Grav, UA: 1.02 (ref 1.010–1.025)
Urobilinogen, UA: 0.2 E.U./dL
pH, UA: 5.5 (ref 5.0–8.0)

## 2022-01-21 MED ORDER — CYCLOBENZAPRINE HCL 10 MG PO TABS: 10.0000 mg | ORAL_TABLET | Freq: Two times a day (BID) | ORAL | 0 refills | Status: DC | PRN

## 2022-01-21 MED ORDER — PREDNISONE 20 MG PO TABS: 40.0000 mg | ORAL_TABLET | Freq: Every day | ORAL | 0 refills | Status: DC

## 2022-01-21 MED ORDER — PREDNISONE 10 MG (21) PO TBPK: ORAL_TABLET | Freq: Every day | ORAL | 0 refills | Status: DC

## 2022-01-21 NOTE — ED Provider Notes (Signed)
?[name] ? ? ? ?CSN: [id number] ?Arrival date & time: 01/21/22  1400 ? ? ?  ? ?History   ?Chief Complaint ?Chief Complaint  ?Patient presents with  ? back pain/uti  ? ? ?HPI ?John Delacruz is a 61 y.o. male.  ? ?Patient here today for evaluation of low back pain that started a few weeks ago after he was doing some heavy lifting. He has had similar in the past so knew what to do to help with physical therapy which did help with sciatica type symptoms he had initially so these have cleared. Low back pain still remains.  He denies any current numbness or tingling. He has not had any weakness. He denies any loss of bowel or bladder function.  ? ?He was also concerned for possible UTI given flank pain. ? ?The history is provided by the patient.  ? ?Past Medical History:  ?Diagnosis Date  ? Hypertension   ? ? ?Patient Active Problem List  ? Diagnosis Date Noted  ? Diverticulitis 12/18/2013  ? HTN (hypertension) 01/01/2013  ? ? ?Past Surgical History:  ?Procedure Laterality Date  ? FRACTURE SURGERY    ? HERNIA REPAIR    ? ? ? ? ? ?Home Medications   ? ?Prior to Admission medications   ?Medication Sig Start Date End Date Taking? Authorizing Provider  ?cyclobenzaprine (FLEXERIL) 10 MG tablet Take 1 tablet (10 mg total) by mouth 2 (two) times daily as needed for muscle spasms. 01/21/22  Yes Tomi Bamberger, PA-C  ?predniSONE (STERAPRED UNI-PAK 21 TAB) 10 MG (21) TBPK tablet Take by mouth daily. Take 6 tabs by mouth daily  for 2 days, then 5 tabs for 2 days, then 4 tabs for 2 days, then 3 tabs for 2 days, 2 tabs for 2 days, then 1 tab by mouth daily for 2 days 01/21/22  Yes Tomi Bamberger, PA-C  ?albuterol (VENTOLIN HFA) 108 (90 Base) MCG/ACT inhaler Inhale 1-2 puffs into the lungs every 6 (six) hours as needed for wheezing or shortness of breath. 04/05/21   Ward, Tylene Fantasia, PA-C  ?amLODipine (NORVASC) 10 MG tablet Take 10 mg by mouth daily.    [provider]  ?doxycycline (VIBRAMYCIN) 100 MG capsule Take  1 capsule (100 mg total) by mouth 2 (two) times daily. 11/02/21   Gustavus Bryant, FNP  ?hydrochlorothiazide (MICROZIDE) 12.5 MG capsule Take 12.5 mg by mouth daily.    [provider]  ?valsartan-hydrochlorothiazide (DIOVAN-HCT) 80-12.5 MG tablet Take 1 tablet by mouth daily. 12/20/21   [provider]  ? ? ?Family History ?Family History  ?Problem Relation Age of Onset  ? Hypertension Mother   ? Kidney disease Mother   ? Lupus Sister   ? ? ?Social History ?Social History  ? ?Tobacco Use  ? Smoking status: Every Day  ?  Packs/day: 1.00  ?  Types: Cigarettes  ? Smokeless tobacco: Never  ?Vaping Use  ? Vaping Use: Never used  ?Substance Use Topics  ? Alcohol use: Yes  ?  Alcohol/week: 3.0 - 4.0 standard drinks  ?  Types: 3 - 4 Cans of beer per week  ?  Comment: daily  ? Drug use: No  ? ? ? ?Allergies   ?Lisinopril ? ? ?Review of Systems ?Review of Systems  ?Constitutional:  Negative for chills and fever.  ?Eyes:  Negative for discharge and redness.  ?Genitourinary:  Positive for flank pain.  ?Musculoskeletal:  Positive for back pain and myalgias.  ?Neurological:  Negative  for numbness.  ? ? ?Physical Exam ?Triage Vital Signs ?ED Triage Vitals  ?Enc Vitals Group  ?   BP   ?   Pulse   ?   Resp   ?   Temp   ?   Temp src   ?   SpO2   ?   Weight   ?   Height   ?   Head Circumference   ?   Peak Flow   ?   Pain Score   ?   Pain Loc   ?   Pain Edu?   ?   Excl. in [place]?   ? ?No data found. ? ?Updated Vital Signs ?BP 135/82 (BP Location: Right Arm)   Pulse 75   Temp 97.7 ?F (36.5 ?C) (Oral)   Resp 18   SpO2 97%  ?   ? ?Physical Exam ?Vitals and nursing note reviewed.  ?Constitutional:   ?   General: He is not in acute distress. ?   Appearance: Normal appearance. He is not ill-appearing.  ?HENT:  ?   Head: Normocephalic and atraumatic.  ?Eyes:  ?   Conjunctiva/sclera: Conjunctivae normal.  ?Cardiovascular:  ?   Rate and Rhythm: Normal rate.  ?Pulmonary:  ?   Effort: Pulmonary effort is normal.   ?Musculoskeletal:  ?   Comments: No TTP to midline spine or across low back  ?Neurological:  ?   Mental Status: He is alert.  ?Psychiatric:     ?   Mood and Affect: Mood normal.     ?   Behavior: Behavior normal.     ?   Thought Content: Thought content normal.  ? ? ? ?UC Treatments / Results  ?Labs ?(all labs ordered are listed, but only abnormal results are displayed) ?Labs Reviewed  ?POCT URINALYSIS DIP (MANUAL ENTRY)  ? ? ?EKG ? ? ?Radiology ?No results found. ? ?Procedures ?Procedures (including critical care time) ? ?Medications Ordered in UC ?Medications - No data to display ? ?Initial Impression / Assessment and Plan / UC Course  ?I have reviewed the triage vital signs and the nursing notes. ? ?Pertinent labs & imaging results that were available during my care of the patient were reviewed by me and considered in my medical decision making (see chart for details). ? ?  ?Suspect likely muscular strain and steroid taper prescribed at patient request as this has worked best for him in the past. Reassured that UA had no signs of infection, hematuria and muscle relaxer prescribed if needed for pain. Encouraged follow up if symptoms fail to improve with treatment or worsen in any way.  ? ?Final Clinical Impressions(s) / UC Diagnoses  ? ?Final diagnoses:  ?Acute left-sided low back pain without sciatica  ? ?Discharge Instructions   ?None ?  ? ?ED Prescriptions   ? ? Medication Sig Dispense Auth. Provider  ? predniSONE (DELTASONE) 20 MG tablet  (Status: Discontinued) Take 2 tablets (40 mg total) by mouth daily with breakfast for 5 days. 10 tablet Francene Finders, PA-C  ? cyclobenzaprine (FLEXERIL) 10 MG tablet Take 1 tablet (10 mg total) by mouth 2 (two) times daily as needed for muscle spasms. 20 tablet Ewell Poe F, PA-C  ? predniSONE (STERAPRED UNI-PAK 21 TAB) 10 MG (21) TBPK tablet Take by mouth daily. Take 6 tabs by mouth daily  for 2 days, then 5 tabs for 2 days, then 4 tabs for 2 days, then 3 tabs for 2  days, 2 tabs for 2  days, then 1 tab by mouth daily for 2 days 42 tablet Francene Finders, PA-C  ? ?  ? ?PDMP not reviewed this encounter. ?  ?Francene Finders, PA-C ?01/21/22 1448 ? ?

## 2022-01-21 NOTE — ED Triage Notes (Signed)
Pt c/o lower back pain s/p lifting heavy material a few weeks ago not relieved by motrin at home states when it happened before he had to get steroid tx.  ? ?Also c/o left flank pain concerned for UTI.  ?

## 2022-01-25 ENCOUNTER — Ambulatory Visit (INDEPENDENT_AMBULATORY_CARE_PROVIDER_SITE_OTHER): Payer: 59 | Admitting: Physician Assistant

## 2022-01-25 ENCOUNTER — Ambulatory Visit (INDEPENDENT_AMBULATORY_CARE_PROVIDER_SITE_OTHER): Payer: 59

## 2022-01-25 ENCOUNTER — Encounter: Payer: Self-pay | Admitting: Physician Assistant

## 2022-01-25 ENCOUNTER — Other Ambulatory Visit: Payer: Self-pay

## 2022-01-25 DIAGNOSIS — M545 Low back pain, unspecified: Secondary | ICD-10-CM | POA: Diagnosis not present

## 2022-01-25 NOTE — Progress Notes (Signed)
? ?Office Visit Note ?  ?Patient: John Delacruz           ?Date of Birth: 1961-01-24           ?MRN: [id number] ?Visit Date: 01/25/2022 ?             ?Requested by: Maurice Small, MD ?[address] ?[address] ?[place],  [place] [address] ?PCP: Maurice Small, MD ? ?Chief Complaint  ?Patient presents with  ? Lower Back - Pain  ? ? ? ? ?HPI: ?Patient is a 61 year old gentleman with a long history of lower back pain.  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  He states that he has had pain in the lower back more on the left side for 2 weeks after moving a 300 pound object.  He felt a strain in his back at the time.  He denies any paresthesias any loss of bowel or bladder control any weakness.  He was seen by urgent care and is currently on his third day of the steroid taper and along with a muscle relaxant which he has not taken yet.  He also has had back pain and been told he has had arthritis in his back for several years so he has his own exercise program that he does.  This did help him a little bit with him some initial numbness but he feels it has not improved.  Pain is with weightbearing on his feet for long periods of time.  Feels better when he sitting ? ?Assessment & Plan: ?Visit Diagnoses:  ?1. Acute midline low back pain without sciatica   ? ? ?Plan: 61 year old gentleman x-rays demonstrate degenerative changes and almost complete joint space narrowing at L5-S1.  No acute injuries.  He does not have any weakness we had a long discussion of what to do now.  Since he is only on his third day of the taper he is going to finish the steroid taper continue doing his exercises and take the muscle relaxant as needed.  We will follow-up in 2 weeks.  Given his fatigue on his legs he may have an element of stenosis.  He will follow-up with Dr. Durward Fortes in 2 weeks.  Certainly if he is no better we could consider an MRI given the long history of his back pain and possible epidural  steroid injections ? ?Follow-Up Instructions: No follow-ups on file.  ? ?Ortho Exam ? ?Patient is alert, oriented, no adenopathy, well-dressed, normal affect, normal respiratory effort. ?Examination of his lower back no palpable deformity.  Tender to palpation over the left lower back more into the left buttock no radiation of symptoms down the left leg he has 5 out of 5 strength with resisted dorsiflexion plantarflexion of his ankles flexion extension of his knees flexion of his hips.  Sensation distally is intact. ? ?Imaging: ?XR Lumbar Spine 2-3 Views ? ?Result Date: 01/25/2022 ?Graphs of his lumbar spine demonstrate no acute fractures.  He has degenerative changes especially at L5-S1 al4-5 L3-4 with ligament ossification.  No listhesis noted.  ?No images are attached to the encounter. ? ?Labs: ?Lab Results  ?Component Value Date  ? REPTSTATUS 07/16/2016 FINAL 07/13/2016  ? GRAMSTAIN  07/13/2016  ?  ABUNDANT WBC PRESENT, PREDOMINANTLY PMN ?ABUNDANT GRAM POSITIVE COCCI IN PAIRS IN CLUSTERS ?RARE GRAM POSITIVE RODS ?  ? CULT NORMAL SKIN FLORA 07/13/2016  ? ? ? ?Lab Results  ?Component Value Date  ? ALBUMIN 3.3 (L) 12/19/2013  ?  ALBUMIN 3.7 12/18/2013  ? ? ?No results found for: MG ?No results found for: VD25OH ? ?No results found for: PREALBUMIN ?CBC EXTENDED Latest Ref Rng & Units 12/19/2013 12/18/2013 12/18/2013  ?WBC 4.0 - 10.5 K/uL 9.2 9.7 11.0(H)  ?RBC 4.22 - 5.81 MIL/uL 5.35 5.38 5.58  ?HGB 13.0 - 17.0 g/dL 15.8 15.9 16.6  ?HCT 39.0 - 52.0 % 44.9 45.1 46.7  ?PLT 150 - 400 K/uL 194 191 197  ?NEUTROABS 1.7 - 7.7 K/uL - - 8.3(H)  ?LYMPHSABS 0.7 - 4.0 K/uL - - 1.4  ? ? ? ?There is no height or weight on file to calculate BMI. ? ?Orders:  ?Orders Placed This Encounter  ?Procedures  ? XR Lumbar Spine 2-3 Views  ? ?No orders of the defined types were placed in this encounter. ? ? ? Procedures: ?No procedures performed ? ?Clinical Data: ?No additional findings. ? ?ROS: ? ?All other systems negative, except as noted in the  HPI. ?Review of Systems ? ?Objective: ?Vital Signs: There were no vitals taken for this visit. ? ?Specialty Comments:  ?No specialty comments available. ? ?PMFS History: ?Patient Active Problem List  ? Diagnosis Date Noted  ? Diverticulitis 12/18/2013  ? HTN (hypertension) 01/01/2013  ? ?Past Medical History:  ?Diagnosis Date  ? Hypertension   ?  ?Family History  ?Problem Relation Age of Onset  ? Hypertension Mother   ? Kidney disease Mother   ? Lupus Sister   ?  ?Past Surgical History:  ?Procedure Laterality Date  ? FRACTURE SURGERY    ? HERNIA REPAIR    ? ?Social History  ? ?Occupational History  ? Not on file  ?Tobacco Use  ? Smoking status: Every Day  ?  Packs/day: 1.00  ?  Types: Cigarettes  ? Smokeless tobacco: Never  ?Vaping Use  ? Vaping Use: Never used  ?Substance and Sexual Activity  ? Alcohol use: Yes  ?  Alcohol/week: 3.0 - 4.0 standard drinks  ?  Types: 3 - 4 Cans of beer per week  ?  Comment: daily  ? Drug use: No  ? Sexual activity: Yes  ? ? ? ? ? ?

## 2022-02-08 ENCOUNTER — Ambulatory Visit: Payer: 59 | Admitting: Orthopaedic Surgery

## 2022-02-15 ENCOUNTER — Telehealth: Payer: Self-pay

## 2022-02-15 NOTE — Telephone Encounter (Signed)
Patient will call  to schedule nurse visit for new lumbar support ?

## 2022-02-15 NOTE — Telephone Encounter (Signed)
Patient would like a call back from [facility] concerning his back.  CB# [phone].  Please advise.  Thank you. ?

## 2022-02-16 ENCOUNTER — Ambulatory Visit (INDEPENDENT_AMBULATORY_CARE_PROVIDER_SITE_OTHER): Payer: 59

## 2022-02-16 DIAGNOSIS — M545 Low back pain, unspecified: Secondary | ICD-10-CM

## 2022-02-16 NOTE — Progress Notes (Signed)
Patient came in today to be fitting with a new lumbar support brace.  ?

## 2022-02-19 DIAGNOSIS — I209 Angina pectoris, unspecified: Secondary | ICD-10-CM

## 2022-02-19 HISTORY — DX: Angina pectoris, unspecified: I20.9

## 2022-02-22 ENCOUNTER — Other Ambulatory Visit (HOSPITAL_COMMUNITY): Payer: 59

## 2022-02-28 ENCOUNTER — Encounter: Payer: Self-pay | Admitting: Cardiology

## 2022-02-28 ENCOUNTER — Ambulatory Visit: Payer: 59 | Admitting: Cardiology

## 2022-02-28 VITALS — BP 130/80 | HR 64 | Ht 69.0 in | Wt 189.0 lb

## 2022-02-28 DIAGNOSIS — E663 Overweight: Secondary | ICD-10-CM | POA: Diagnosis not present

## 2022-02-28 DIAGNOSIS — I10 Essential (primary) hypertension: Secondary | ICD-10-CM | POA: Diagnosis not present

## 2022-02-28 DIAGNOSIS — R072 Precordial pain: Secondary | ICD-10-CM

## 2022-02-28 DIAGNOSIS — R9431 Abnormal electrocardiogram [ECG] [EKG]: Secondary | ICD-10-CM | POA: Insufficient documentation

## 2022-02-28 DIAGNOSIS — R079 Chest pain, unspecified: Secondary | ICD-10-CM

## 2022-02-28 LAB — BASIC METABOLIC PANEL
BUN/Creatinine Ratio: 10 (ref 10–24)
BUN: 13 mg/dL (ref 8–27)
CO2: 27 mmol/L (ref 20–29)
Calcium: 10.3 mg/dL — ABNORMAL HIGH (ref 8.6–10.2)
Chloride: 96 mmol/L (ref 96–106)
Creatinine, Ser: 1.33 mg/dL — ABNORMAL HIGH (ref 0.76–1.27)
Glucose: 110 mg/dL — ABNORMAL HIGH (ref 70–99)
Potassium: 4.4 mmol/L (ref 3.5–5.2)
Sodium: 136 mmol/L (ref 134–144)
eGFR: 60 mL/min/{1.73_m2} (ref >59)

## 2022-02-28 LAB — MAGNESIUM: Magnesium: 2.1 mg/dL (ref 1.6–2.3)

## 2022-02-28 MED ORDER — METOPROLOL TARTRATE 50 MG PO TABS: ORAL_TABLET | ORAL | 0 refills | Status: DC

## 2022-02-28 NOTE — Patient Instructions (Addendum)
Medication Instructions:  ?Your physician recommends that you continue on your current medications as directed. Please refer to the Current Medication list given to you today.  ?*If you need a refill on your cardiac medications before your next appointment, please call your pharmacy* ? ? ?Lab Work: ?Your physician recommends that you return for lab work in:  ?TODAY: BMET, Mag ?If you have labs (blood work) drawn today and your tests are completely normal, you will receive your results only by: ?MyChart Message (if you have MyChart) OR ?A paper copy in the mail ?If you have any lab test that is abnormal or we need to change your treatment, we will call you to review the results. ? ? ?Testing/Procedures: ? ? ?Your cardiac CT will be scheduled at one of the below locations:  ? ?[facility] ?[address] ?[place], [place] [address] ?[phone] ? ? ?If scheduled at [facility], please arrive at the [facility] and Children's Entrance (Entrance C2) of [facility] 30 minutes prior to test start time. ?You can use the FREE valet parking offered at entrance C (encouraged to control the heart rate for the test)  ?Proceed to the [facility] Radiology Department (first floor) to check-in and test prep. ? ?All radiology patients and guests should use entrance C2 at [facility], accessed from [facility], even though the hospital's physical address listed is [address]. ? ? ? ? ?Please follow these instructions carefully (unless otherwise directed): ? ?Hold all erectile dysfunction medications at least 3 days (72 hrs) prior to test. ? ?On the Night Before the Test: ?Be sure to Drink plenty of water. ?Do not consume any caffeinated/decaffeinated beverages or chocolate 12 hours prior to your test. ?Do not take any antihistamines 12 hours prior to your test. ? ?On the Day of the Test: ?Drink plenty of water until 1 hour prior to the test. ?Do not eat any food 4 hours prior  to the test. ?You may take your regular medications prior to the test.  ?Take metoprolol (Lopressor) two hours prior to test. ?HOLD Furosemide/Hydrochlorothiazide morning of the test. ?    ?After the Test: ?Drink plenty of water. ?After receiving IV contrast, you may experience a mild flushed feeling. This is normal. ?On occasion, you may experience a mild rash up to 24 hours after the test. This is not dangerous. If this occurs, you can take Benadryl 25 mg and increase your fluid intake. ?If you experience trouble breathing, this can be serious. If it is severe call 911 IMMEDIATELY. If it is mild, please call our office. ?If you take any of these medications: Glipizide/Metformin, Avandament, Glucavance, please do not take 48 hours after completing test unless otherwise instructed. ? ?We will call to schedule your test 2-4 weeks out understanding that some insurance companies will need an authorization prior to the service being performed.  ? ?For non-scheduling related questions, please contact the cardiac imaging nurse navigator should you have any questions/concerns: ?Rockwell Alexandria, Cardiac Imaging Nurse Navigator ?Larey Brick, Cardiac Imaging Nurse Navigator ?[facility] ?Direct Office Dial: [phone]  ? ?For scheduling needs, including cancellations and rescheduling, please call [place], [phone]. ? ? ? ?Follow-Up: ?At [facility], you and your health needs are our priority.  As part of our continuing mission to provide you with exceptional heart care, we have created designated Provider Care Teams.  These Care Teams include your primary Cardiologist (physician) and Advanced Practice Providers (APPs -  Physician Assistants and Nurse Practitioners) who all work together to provide you with the care you need, when you need it. ? ?We recommend signing up for the patient portal called "MyChart".  Sign up information is provided on this After Visit Summary.  MyChart is used to  connect with patients for Virtual Visits (Telemedicine).  Patients are able to view lab/test results, encounter notes, upcoming appointments, etc.  Non-urgent messages can be sent to your provider as well.   ?To learn more about what you can do with MyChart, go to [email or web address].   ? ?Your next appointment:   ?3 month(s) ? ?The format for your next appointment:   ?In Person ? ?Provider:   ?Thomasene Ripple, DO   ? ? ?Other Instructions ? ? ?Important Information About Sugar ? ? ? ? ?  ?

## 2022-02-28 NOTE — H&P (View-Only) (Signed)
?Cardiology Office Note:   ? ?Date:  02/28/2022  ? ?ID:  John Delacruz, DOB [birth year], MRN [id number] ? ?PCP:  Mila PalmerWolters, Sharon, MD  ?Cardiologist:  Thomasene RippleKardie Aarion Metzgar, DO  ?Electrophysiologist:  None  ? ?Referring MD: Shirlean MylarWebb, Carol, MD  ? ?" I am having chest pain" ? ?History of Present Illness:   ? ?John Delacruz is a [age] y.o. male with a hx of hypertension, obesity, here today for evaluation of chest pain.  The patient tells me that he has been experiencing intermittent chest discomfort.  He described as a pressure-like sensation.  Comes on and on.  On exertion.  Nothing makes it better or worse. ? ?He is very concerned about this because this is all new.  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 ? ?Past Medical History:  ?Diagnosis Date  ? Hypertension   ? ? ?Past Surgical History:  ?Procedure Laterality Date  ? FRACTURE SURGERY    ? HERNIA REPAIR    ? ? ?Current Medications: ?Current Meds  ?Medication Sig  ? albuterol (VENTOLIN HFA) 108 (90 Base) MCG/ACT inhaler Inhale 1-2 puffs into the lungs every 6 (six) hours as needed for wheezing or shortness of breath.  ? amLODipine (NORVASC) 10 MG tablet Take 10 mg by mouth daily.  ? Cyanocobalamin (VITAMIN B-12 PO) Take 1 tablet by mouth daily.  ? cyclobenzaprine (FLEXERIL) 10 MG tablet Take 1 tablet (10 mg total) by mouth 2 (two) times daily as needed for muscle spasms.  ? metoprolol tartrate (LOPRESSOR) 50 MG tablet Take 2 hours before CT scan  ? Turmeric 500 MG TABS Take 500 mg by mouth daily.  ? valsartan-hydrochlorothiazide (DIOVAN-HCT) 80-12.5 MG tablet Take 1 tablet by mouth daily.  ? [DISCONTINUED] doxycycline (VIBRAMYCIN) 100 MG capsule Take 1 capsule (100 mg total) by mouth 2 (two) times daily.  ? [DISCONTINUED] hydrochlorothiazide (MICROZIDE) 12.5 MG capsule Take 12.5 mg by mouth daily.  ? [DISCONTINUED] predniSONE (STERAPRED UNI-PAK 21 TAB) 10 MG (21) TBPK tablet Take by mouth daily. Take 6 tabs by mouth daily  for 2 days, then 5 tabs for 2 days, then 4  tabs for 2 days, then 3 tabs for 2 days, 2 tabs for 2 days, then 1 tab by mouth daily for 2 days  ?  ? ?Allergies:   Lisinopril  ? ?Social History  ? ?Socioeconomic History  ? Marital status: Single  ?  Spouse name: Not on file  ? Number of children: Not on file  ? Years of education: Not on file  ? Highest education level: Not on file  ?Occupational History  ? Not on file  ?Tobacco Use  ? Smoking status: Every Day  ?  Packs/day: 1.00  ?  Types: Cigarettes  ? Smokeless tobacco: Never  ?Vaping Use  ? Vaping Use: Never used  ?Substance and Sexual Activity  ? Alcohol use: Yes  ?  Alcohol/week: 3.0 - 4.0 standard drinks  ?  Types: 3 - 4 Cans of beer per week  ?  Comment: daily  ? Drug use: No  ? Sexual activity: Yes  ?Other Topics Concern  ? Not on file  ?Social History Narrative  ? Not on file  ? ?Social Determinants of Health  ? ?Financial Resource Strain: Not on file  ?Food Insecurity: Not on file  ?Transportation Needs: Not on file  ?Physical Activity: Not on file  ?Stress: Not on file  ?Social Connections: Not on file  ?  ? ?Family History: ?The patient's family history includes  Hypertension in his mother; Kidney disease in his mother; Lupus in his sister. ? ?ROS:   ?Review of Systems  ?Constitution: Negative for decreased appetite, fever and weight gain.  ?HENT: Negative for congestion, ear discharge, hoarse voice and sore throat.   ?Eyes: Negative for discharge, redness, vision loss in right eye and visual halos.  ?Cardiovascular: Reports chest pain.negative for dyspnea on exertion, leg swelling, orthopnea and palpitations.  ?Respiratory: Negative for cough, hemoptysis, shortness of breath and snoring.   ?Endocrine: Negative for heat intolerance and polyphagia.  ?Hematologic/Lymphatic: Negative for bleeding problem. Does not bruise/bleed easily.  ?Skin: Negative for flushing, nail changes, rash and suspicious lesions.  ?Musculoskeletal: Negative for arthritis, joint pain, muscle cramps, myalgias, neck pain and  stiffness.  ?Gastrointestinal: Negative for abdominal pain, bowel incontinence, diarrhea and excessive appetite.  ?Genitourinary: Negative for decreased libido, genital sores and incomplete emptying.  ?Neurological: Negative for brief paralysis, focal weakness, headaches and loss of balance.  ?Psychiatric/Behavioral: Negative for altered mental status, depression and suicidal ideas.  ?Allergic/Immunologic: Negative for HIV exposure and persistent infections.  ? ? ?EKGs/Labs/Other Studies Reviewed:   ? ?The following studies were reviewed today: ? ? ?EKG:  The ekg ordered today demonstrates sinus rhythm, heart rate 64 bpm with EKG changes concerning for septal infarction. ? ?Recent Labs: ?No results found for requested labs within last 8760 hours.  ?Recent Lipid Panel ?No results found for: CHOL, TRIG, HDL, CHOLHDL, VLDL, LDLCALC, LDLDIRECT ? ?Physical Exam:   ? ?VS:  BP 130/80 (BP Location: Right Arm, Patient Position: Sitting, Cuff Size: Normal)   Pulse 64   Ht 5\' 9"  (1.753 m)   Wt 189 lb (85.7 kg)   BMI 27.91 kg/m?    ? ?Wt Readings from Last 3 Encounters:  ?02/28/22 189 lb (85.7 kg)  ?07/12/17 205 lb (93 kg)  ?01/05/17 195 lb (88.5 kg)  ?  ? ?GEN: Well nourished, well developed in no acute distress ?HEENT: Normal ?NECK: No JVD; No carotid bruits ?LYMPHATICS: No lymphadenopathy ?CARDIAC: S1S2 noted,RRR, no murmurs, rubs, gallops ?RESPIRATORY:  Clear to auscultation without rales, wheezing or rhonchi  ?ABDOMEN: Soft, non-tender, non-distended, +bowel sounds, no guarding. ?EXTREMITIES: No edema, No cyanosis, no clubbing ?MUSCULOSKELETAL:  No deformity  ?SKIN: Warm and dry ?NEUROLOGIC:  Alert and oriented x 3, non-focal ?PSYCHIATRIC:  Normal affect, good insight ? ?ASSESSMENT:   ? ?1. Chest pain of uncertain etiology   ?2. Hypertension, unspecified type   ?3. Nonspecific abnormal electrocardiogram (ECG) (EKG)   ?4. Overweight (BMI 25.0-29.9)   ?5. Precordial pain   ? ?PLAN:   ? ?The symptoms chest pain is  concerning, this patient does have intermediate risk for coronary artery disease and at this time I would like to pursue an ischemic evaluation in this patient.  Shared decision a coronary CTA at this time is appropriate.  I have discussed with the patient about the testing.  The patient has no IV contrast allergy and is agreeable to proceed with this test. ? ?The patient is in agreement with the above plan. The patient left the office in stable condition.  The patient will follow up in 3 months ? ? ?Medication Adjustments/Labs and Tests Ordered: ?Current medicines are reviewed at length with the patient today.  Concerns regarding medicines are outlined above.  ?Orders Placed This Encounter  ?Procedures  ? CT CORONARY MORPH W/CTA COR W/SCORE W/CA W/CM &/OR WO/CM  ? Basic Metabolic Panel (BMET)  ? Magnesium  ? EKG 12-Lead  ? ?Meds ordered this encounter  ?  Medications  ? metoprolol tartrate (LOPRESSOR) 50 MG tablet  ?  Sig: Take 2 hours before CT scan  ?  Dispense:  1 tablet  ?  Refill:  0  ? ? ?Patient Instructions  ?Medication Instructions:  ?Your physician recommends that you continue on your current medications as directed. Please refer to the Current Medication list given to you today.  ?*If you need a refill on your cardiac medications before your next appointment, please call your pharmacy* ? ? ?Lab Work: ?Your physician recommends that you return for lab work in:  ?TODAY: BMET, Mag ?If you have labs (blood work) drawn today and your tests are completely normal, you will receive your results only by: ?MyChart Message (if you have MyChart) OR ?A paper copy in the mail ?If you have any lab test that is abnormal or we need to change your treatment, we will call you to review the results. ? ? ?Testing/Procedures: ? ? ?Your cardiac CT will be scheduled at one of the below locations:  ? ?[facility] ?[address] ?[place], [place] [address] ?[phone] ? ? ?If scheduled at [facility],  please arrive at the [facility] (Entrance C2) of [facility] 30 minutes prior to test start time. ?You can use the FREE valet parking offered at entrance C (encouraged to control the heart rat

## 2022-02-28 NOTE — Progress Notes (Signed)
?Cardiology Office Note:   ? ?Date:  02/28/2022  ? ?ID:  John Delacruz, DOB 01/16/1961, MRN [id number] ? ?PCP:  Wolters, Sharon, MD  ?Cardiologist:  Madonna Flegal, DO  ?Electrophysiologist:  None  ? ?Referring MD: Webb, Carol, MD  ? ?" I am having chest pain" ? ?History of Present Illness:   ? ?John Delacruz is a 61 y.o. male with a hx of hypertension, obesity, here today for evaluation of chest pain.  The patient tells me that he has been experiencing intermittent chest discomfort.  He described as a pressure-like sensation.  Comes on and on.  On exertion.  Nothing makes it better or worse. ? ?He is very concerned about this because this is all new.  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 ? ?Past Medical History:  ?Diagnosis Date  ? Hypertension   ? ? ?Past Surgical History:  ?Procedure Laterality Date  ? FRACTURE SURGERY    ? HERNIA REPAIR    ? ? ?Current Medications: ?Current Meds  ?Medication Sig  ? albuterol (VENTOLIN HFA) 108 (90 Base) MCG/ACT inhaler Inhale 1-2 puffs into the lungs every 6 (six) hours as needed for wheezing or shortness of breath.  ? amLODipine (NORVASC) 10 MG tablet Take 10 mg by mouth daily.  ? Cyanocobalamin (VITAMIN B-12 PO) Take 1 tablet by mouth daily.  ? cyclobenzaprine (FLEXERIL) 10 MG tablet Take 1 tablet (10 mg total) by mouth 2 (two) times daily as needed for muscle spasms.  ? metoprolol tartrate (LOPRESSOR) 50 MG tablet Take 2 hours before CT scan  ? Turmeric 500 MG TABS Take 500 mg by mouth daily.  ? valsartan-hydrochlorothiazide (DIOVAN-HCT) 80-12.5 MG tablet Take 1 tablet by mouth daily.  ? [DISCONTINUED] doxycycline (VIBRAMYCIN) 100 MG capsule Take 1 capsule (100 mg total) by mouth 2 (two) times daily.  ? [DISCONTINUED] hydrochlorothiazide (MICROZIDE) 12.5 MG capsule Take 12.5 mg by mouth daily.  ? [DISCONTINUED] predniSONE (STERAPRED UNI-PAK 21 TAB) 10 MG (21) TBPK tablet Take by mouth daily. Take 6 tabs by mouth daily  for 2 days, then 5 tabs for 2 days, then 4  tabs for 2 days, then 3 tabs for 2 days, 2 tabs for 2 days, then 1 tab by mouth daily for 2 days  ?  ? ?Allergies:   Lisinopril  ? ?Social History  ? ?Socioeconomic History  ? Marital status: Single  ?  Spouse name: Not on file  ? Number of children: Not on file  ? Years of education: Not on file  ? Highest education level: Not on file  ?Occupational History  ? Not on file  ?Tobacco Use  ? Smoking status: Every Day  ?  Packs/day: 1.00  ?  Types: Cigarettes  ? Smokeless tobacco: Never  ?Vaping Use  ? Vaping Use: Never used  ?Substance and Sexual Activity  ? Alcohol use: Yes  ?  Alcohol/week: 3.0 - 4.0 standard drinks  ?  Types: 3 - 4 Cans of beer per week  ?  Comment: daily  ? Drug use: No  ? Sexual activity: Yes  ?Other Topics Concern  ? Not on file  ?Social History Narrative  ? Not on file  ? ?Social Determinants of Health  ? ?Financial Resource Strain: Not on file  ?Food Insecurity: Not on file  ?Transportation Needs: Not on file  ?Physical Activity: Not on file  ?Stress: Not on file  ?Social Connections: Not on file  ?  ? ?Family History: ?The patient's family history includes   Hypertension in his mother; Kidney disease in his mother; Lupus in his sister. ? ?ROS:   ?Review of Systems  ?Constitution: Negative for decreased appetite, fever and weight gain.  ?HENT: Negative for congestion, ear discharge, hoarse voice and sore throat.   ?Eyes: Negative for discharge, redness, vision loss in right eye and visual halos.  ?Cardiovascular: Reports chest pain.negative for dyspnea on exertion, leg swelling, orthopnea and palpitations.  ?Respiratory: Negative for cough, hemoptysis, shortness of breath and snoring.   ?Endocrine: Negative for heat intolerance and polyphagia.  ?Hematologic/Lymphatic: Negative for bleeding problem. Does not bruise/bleed easily.  ?Skin: Negative for flushing, nail changes, rash and suspicious lesions.  ?Musculoskeletal: Negative for arthritis, joint pain, muscle cramps, myalgias, neck pain and  stiffness.  ?Gastrointestinal: Negative for abdominal pain, bowel incontinence, diarrhea and excessive appetite.  ?Genitourinary: Negative for decreased libido, genital sores and incomplete emptying.  ?Neurological: Negative for brief paralysis, focal weakness, headaches and loss of balance.  ?Psychiatric/Behavioral: Negative for altered mental status, depression and suicidal ideas.  ?Allergic/Immunologic: Negative for HIV exposure and persistent infections.  ? ? ?EKGs/Labs/Other Studies Reviewed:   ? ?The following studies were reviewed today: ? ? ?EKG:  The ekg ordered today demonstrates sinus rhythm, heart rate 64 bpm with EKG changes concerning for septal infarction. ? ?Recent Labs: ?No results found for requested labs within last 8760 hours.  ?Recent Lipid Panel ?No results found for: CHOL, TRIG, HDL, CHOLHDL, VLDL, LDLCALC, LDLDIRECT ? ?Physical Exam:   ? ?VS:  BP 130/80 (BP Location: Right Arm, Patient Position: Sitting, Cuff Size: Normal)   Pulse 64   Ht 5\' 9"  (1.753 m)   Wt 189 lb (85.7 kg)   BMI 27.91 kg/m?    ? ?Wt Readings from Last 3 Encounters:  ?02/28/22 189 lb (85.7 kg)  ?07/12/17 205 lb (93 kg)  ?01/05/17 195 lb (88.5 kg)  ?  ? ?GEN: Well nourished, well developed in no acute distress ?HEENT: Normal ?NECK: No JVD; No carotid bruits ?LYMPHATICS: No lymphadenopathy ?CARDIAC: S1S2 noted,RRR, no murmurs, rubs, gallops ?RESPIRATORY:  Clear to auscultation without rales, wheezing or rhonchi  ?ABDOMEN: Soft, non-tender, non-distended, +bowel sounds, no guarding. ?EXTREMITIES: No edema, No cyanosis, no clubbing ?MUSCULOSKELETAL:  No deformity  ?SKIN: Warm and dry ?NEUROLOGIC:  Alert and oriented x 3, non-focal ?PSYCHIATRIC:  Normal affect, good insight ? ?ASSESSMENT:   ? ?1. Chest pain of uncertain etiology   ?2. Hypertension, unspecified type   ?3. Nonspecific abnormal electrocardiogram (ECG) (EKG)   ?4. Overweight (BMI 25.0-29.9)   ?5. Precordial pain   ? ?PLAN:   ? ?The symptoms chest pain is  concerning, this patient does have intermediate risk for coronary artery disease and at this time I would like to pursue an ischemic evaluation in this patient.  Shared decision a coronary CTA at this time is appropriate.  I have discussed with the patient about the testing.  The patient has no IV contrast allergy and is agreeable to proceed with this test. ? ?The patient is in agreement with the above plan. The patient left the office in stable condition.  The patient will follow up in 3 months ? ? ?Medication Adjustments/Labs and Tests Ordered: ?Current medicines are reviewed at length with the patient today.  Concerns regarding medicines are outlined above.  ?Orders Placed This Encounter  ?Procedures  ? CT CORONARY MORPH W/CTA COR W/SCORE W/CA W/CM &/OR WO/CM  ? Basic Metabolic Panel (BMET)  ? Magnesium  ? EKG 12-Lead  ? ?Meds ordered this encounter  ?  Medications  ? metoprolol tartrate (LOPRESSOR) 50 MG tablet  ?  Sig: Take 2 hours before CT scan  ?  Dispense:  1 tablet  ?  Refill:  0  ? ? ?Patient Instructions  ?Medication Instructions:  ?Your physician recommends that you continue on your current medications as directed. Please refer to the Current Medication list given to you today.  ?*If you need a refill on your cardiac medications before your next appointment, please call your pharmacy* ? ? ?Lab Work: ?Your physician recommends that you return for lab work in:  ?TODAY: BMET, Mag ?If you have labs (blood work) drawn today and your tests are completely normal, you will receive your results only by: ?MyChart Message (if you have MyChart) OR ?A paper copy in the mail ?If you have any lab test that is abnormal or we need to change your treatment, we will call you to review the results. ? ? ?Testing/Procedures: ? ? ?Your cardiac CT will be scheduled at one of the below locations:  ? ?[facility] ?[address] ?[place], [place] [address] ?[phone] ? ? ?If scheduled at [facility],  please arrive at the [facility] (Entrance C2) of [facility] 30 minutes prior to test start time. ?You can use the FREE valet parking offered at entrance C (encouraged to control the heart rat

## 2022-03-01 ENCOUNTER — Encounter: Payer: Self-pay | Admitting: Cardiology

## 2022-03-11 ENCOUNTER — Telehealth (HOSPITAL_COMMUNITY): Payer: Self-pay | Admitting: *Deleted

## 2022-03-11 NOTE — Telephone Encounter (Signed)
Reaching out to patient to offer assistance regarding upcoming cardiac imaging study; pt verbalizes understanding of appt date/time, parking situation and where to check in, pre-test NPO status and medications ordered; name and call back number provided for further questions should they arise ? ?Gordy Clement RN Navigator Cardiac Imaging ?[facility] ?[phone] office ?[phone] cell ? ?Patient to take 50mg  metoprolol tartrate two hours prior to his cardiac CT scan.  He is aware to arrive at 2:45pm for this test. ?

## 2022-03-14 ENCOUNTER — Other Ambulatory Visit: Payer: Self-pay | Admitting: Cardiology

## 2022-03-14 ENCOUNTER — Ambulatory Visit (HOSPITAL_COMMUNITY)
Admission: RE | Admit: 2022-03-14 | Discharge: 2022-03-14 | Disposition: A | Discharge status: Home / Self Care | Payer: 59 | Source: Ambulatory Visit | Attending: Cardiology | Admitting: Cardiology

## 2022-03-14 DIAGNOSIS — R072 Precordial pain: Secondary | ICD-10-CM

## 2022-03-14 DIAGNOSIS — R931 Abnormal findings on diagnostic imaging of heart and coronary circulation: Secondary | ICD-10-CM

## 2022-03-14 DIAGNOSIS — I2510 Atherosclerotic heart disease of native coronary artery without angina pectoris: Secondary | ICD-10-CM | POA: Diagnosis not present

## 2022-03-14 IMAGING — CT CT HEART MORP W/ CTA COR W/ SCORE W/ CA W/CM &/OR W/O CM
4 of 7 series · 8 of 20 positions shown, 9 images · non-contrast
Comparison: None.

Addendum:
CLINICAL DATA: 60M with chest pain

EXAM:
Cardiac/Coronary CTA
TECHNIQUE: The patient was scanned on a [employer] Force scanner.

[Series 6: best diast 76 % · axial · 0.39mm/px · z∈[+1302,+1342]mm · 2 of 301 slices shown]
[im 101/301  vessel]
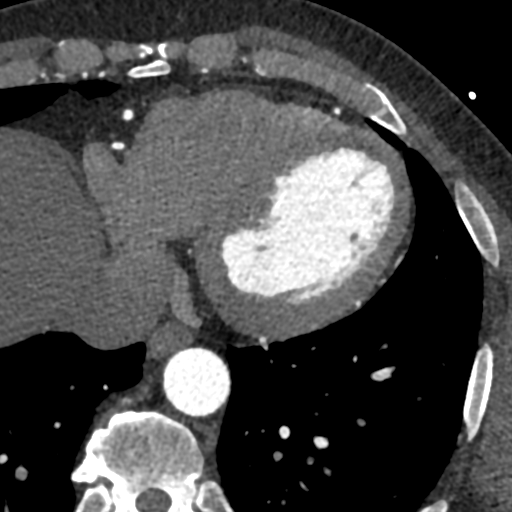
[im 201/301  vessel]
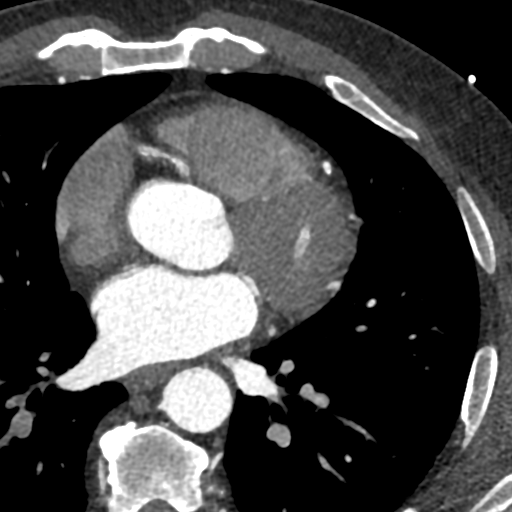

[Series 7: best syst · axial · 0.39mm/px · z∈[+1303,+1342]mm · 2 of 296 slices shown, 3 images]
[im 99/296  vessel]
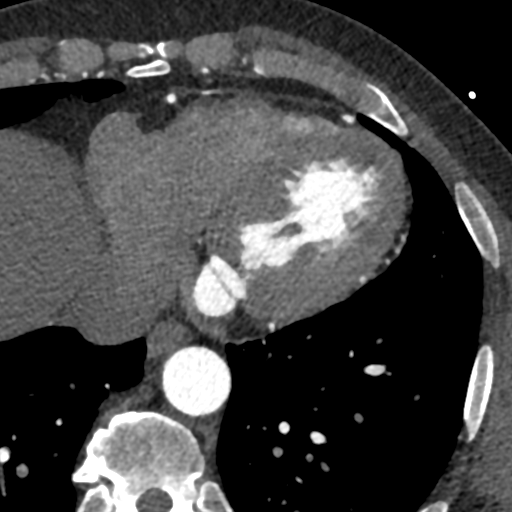
[im 99/296  lung]
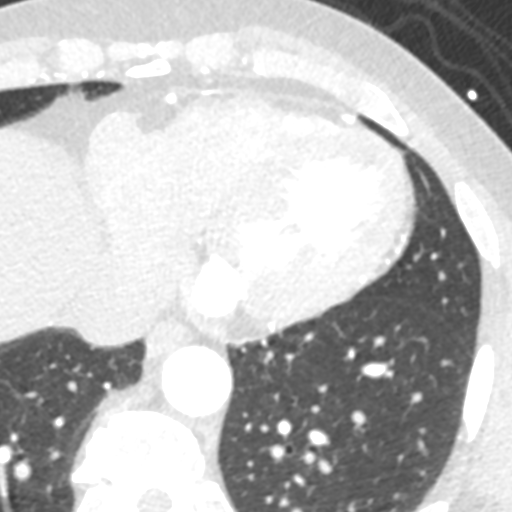
[im 197/296  vessel]
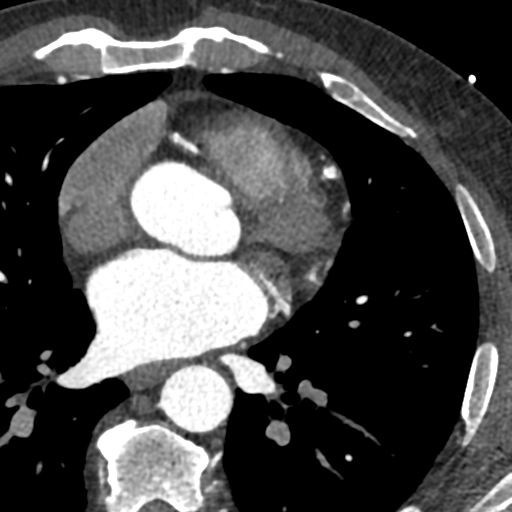

[Series 8: ts syst sharp · axial · 0.39mm/px · z∈[+1302,+1342]mm · 2 of 301 slices shown]
[im 101/301  lung]
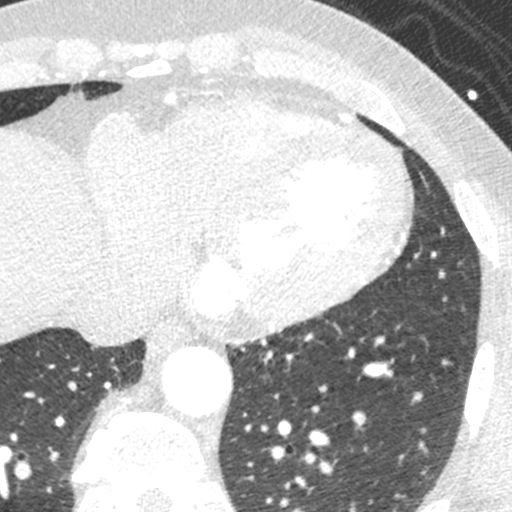
[im 201/301  lung]
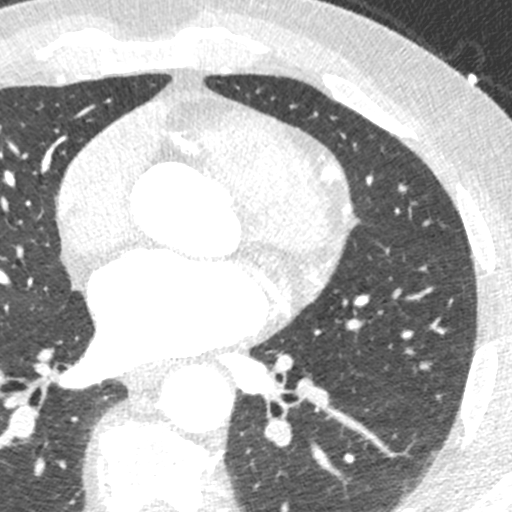

[Series 9: ts diast sharp · axial · 0.39mm/px · z∈[+1302,+1342]mm · 2 of 301 slices shown]
[im 101/301  lung]
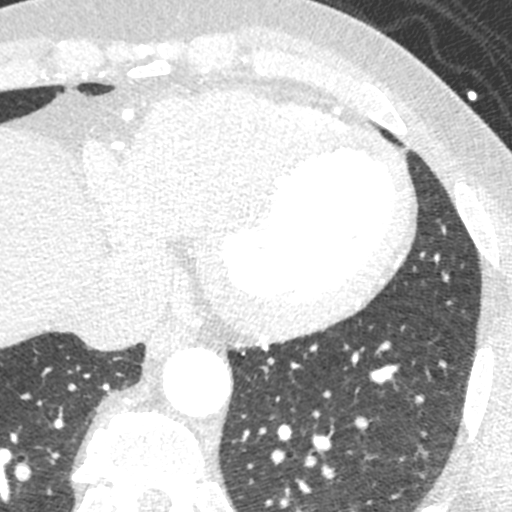
[im 201/301  lung]
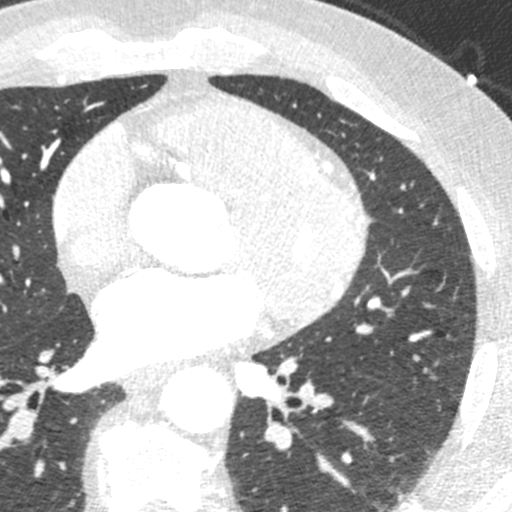

[8 of 20 positions shown; findings below may reference images not displayed]

FINDINGS: A 100 kV prospective scan was triggered in the descending thoracic
aorta at 111 HU's. Axial non-contrast 3 mm slices were carried out
through the heart. The data set was analyzed on a dedicated work
station and scored using the Agatson method. Gantry rotation speed
was 250 msecs and collimation was .6 mm. 0.8 mg of sl NTG was given.
The 3D data set was reconstructed in 5% intervals of the 35-75 % of
the R-R cycle. Phases were analyzed on a dedicated work station
using MPR, MIP and VRT modes. The patient received 80 cc of
contrast.

Coronary Arteries:  Normal coronary origin.  Right dominance.

RCA is a large dominant artery that gives rise to PDA and PLA. Mixed
plaque in the proximal RCA causes 70-99% stenosis. Mixed plaque in
the mid RCA causes 50-69% stenosis

Left main is a large artery that gives rise to LAD and LCX arteries.
Calcified plaque in the left main causes 0-24% stenosis

LAD is a large vessel. Mixed plaque in the proximal LAD causes
25-49% stenosis. Mixed plaque in the mid LAD causes 70-99% stenosis.
High risk plaque features including positive remodeling, low
attenuation plaque, spotty calcification, and napkin ring sign.

LCX is a non-dominant artery that gives rise to one large OM1
branch. Mixed plaque in the mid to distal LCX causes 50-69%
stenosis. Mixed plaque in the OM1 causes 50-69% stenosis

Other findings:

Left Ventricle: Normal size

Left Atrium: Normal size. PFO

Pulmonary Veins: Normal configuration

Right Ventricle: Normal size

Right Atrium: Normal size

Cardiac valves: No calcifications

Thoracic aorta: Normal size

Pulmonary Arteries: Normal size

Systemic Veins: Normal drainage

Pericardium: Normal thickness
IMPRESSION: 1. Coronary calcium score of 627. This was 97th percentile for age
and sex matched control.

2.  Normal coronary origin with right dominance.

3.  Obstructive CAD

4. Mixed plaque in mid LAD causes severe (70-99%) stenosis. High
risk plaque features including positive remodeling, low attenuation
plaque, spotty calcification, and napkin ring sign.

5.  Mixed plaque in proximal RCA causes severe (70-99%) stenosis

6. Mixed plaque in the mid to distal LCX causes moderate (50-69%)
stenosis. Mixed plaque in OM1 causes moderate (50-69%) stenosis

7.  PFO

8.  Will send for CTFFR

CAD-RADS 4 Severe stenosis. (70-99% or > 50% left main). Cardiac
catheterization is recommended. Consider symptom-guided
anti-ischemic pharmacotherapy as well as risk factor modification
per guideline directed care.

EXAM:
OVER-READ INTERPRETATION  CT CHEST

The following report is an over-read performed by radiologist Dr.
Hai Hyde [[email or web address]] on 03/15/2022. This over-read
does not include interpretation of cardiac or coronary anatomy or
pathology. The coronary CTA interpretation by the cardiologist is
attached.
FINDINGS: Vascular: Aortic atherosclerosis. No central pulmonary embolism, on
this non-dedicated study.

Mediastinum/Nodes: No imaged thoracic adenopathy.

Lungs/Pleura: No pleural fluid.  Clear imaged lungs.

Upper Abdomen: Normal imaged portions of the liver, spleen, stomach.

Musculoskeletal: No acute osseous abnormality.
IMPRESSION: No acute findings in the imaged extracardiac chest.

Aortic Atherosclerosis ([id number]-BT7.7).

*** End of Addendum ***
FINDINGS: A 100 kV prospective scan was triggered in the descending thoracic
aorta at 111 HU's. Axial non-contrast 3 mm slices were carried out
through the heart. The data set was analyzed on a dedicated work
station and scored using the Agatson method. Gantry rotation speed
was 250 msecs and collimation was .6 mm. 0.8 mg of sl NTG was given.
The 3D data set was reconstructed in 5% intervals of the 35-75 % of
the R-R cycle. Phases were analyzed on a dedicated work station
using MPR, MIP and VRT modes. The patient received 80 cc of
contrast.

Coronary Arteries:  Normal coronary origin.  Right dominance.

RCA is a large dominant artery that gives rise to PDA and PLA. Mixed
plaque in the proximal RCA causes 70-99% stenosis. Mixed plaque in
the mid RCA causes 50-69% stenosis

Left main is a large artery that gives rise to LAD and LCX arteries.
Calcified plaque in the left main causes 0-24% stenosis

LAD is a large vessel. Mixed plaque in the proximal LAD causes
25-49% stenosis. Mixed plaque in the mid LAD causes 70-99% stenosis.
High risk plaque features including positive remodeling, low
attenuation plaque, spotty calcification, and napkin ring sign.

LCX is a non-dominant artery that gives rise to one large OM1
branch. Mixed plaque in the mid to distal LCX causes 50-69%
stenosis. Mixed plaque in the OM1 causes 50-69% stenosis

Other findings:

Left Ventricle: Normal size

Left Atrium: Normal size. PFO

Pulmonary Veins: Normal configuration

Right Ventricle: Normal size

Right Atrium: Normal size

Cardiac valves: No calcifications

Thoracic aorta: Normal size

Pulmonary Arteries: Normal size

Systemic Veins: Normal drainage

Pericardium: Normal thickness
IMPRESSION: 1. Coronary calcium score of 627. This was 97th percentile for age
and sex matched control.

2.  Normal coronary origin with right dominance.

3.  Obstructive CAD

4. Mixed plaque in mid LAD causes severe (70-99%) stenosis. High
risk plaque features including positive remodeling, low attenuation
plaque, spotty calcification, and napkin ring sign.

5.  Mixed plaque in proximal RCA causes severe (70-99%) stenosis

6. Mixed plaque in the mid to distal LCX causes moderate (50-69%)
stenosis. Mixed plaque in OM1 causes moderate (50-69%) stenosis

7.  PFO

8.  Will send for CTFFR

CAD-RADS 4 Severe stenosis. (70-99% or > 50% left main). Cardiac
catheterization is recommended. Consider symptom-guided
anti-ischemic pharmacotherapy as well as risk factor modification
per guideline directed care.

## 2022-03-14 MED ORDER — NITROGLYCERIN 0.4 MG SL SUBL
SUBLINGUAL_TABLET | SUBLINGUAL | Status: AC
  Administered 2022-03-14: 0.8 mg via SUBLINGUAL
  Filled 2022-03-14: qty 2

## 2022-03-14 MED ORDER — NITROGLYCERIN 0.4 MG SL SUBL: 0.8000 mg | SUBLINGUAL_TABLET | Freq: Once | SUBLINGUAL | Status: AC

## 2022-03-14 MED ORDER — IOHEXOL 350 MG/ML SOLN
95.0000 mL | Freq: Once | INTRAVENOUS | Status: AC | PRN
  Administered 2022-03-14: 95 mL via INTRAVENOUS

## 2022-03-15 ENCOUNTER — Other Ambulatory Visit: Payer: Self-pay

## 2022-03-15 ENCOUNTER — Ambulatory Visit (HOSPITAL_COMMUNITY)
Admission: RE | Admit: 2022-03-15 | Discharge: 2022-03-15 | Disposition: A | Discharge status: Home / Self Care | Payer: 59 | Source: Ambulatory Visit | Attending: Cardiology | Admitting: Cardiology

## 2022-03-15 ENCOUNTER — Encounter: Payer: Self-pay | Admitting: Cardiology

## 2022-03-15 DIAGNOSIS — Z79899 Other long term (current) drug therapy: Secondary | ICD-10-CM

## 2022-03-15 DIAGNOSIS — R931 Abnormal findings on diagnostic imaging of heart and coronary circulation: Secondary | ICD-10-CM

## 2022-03-15 NOTE — Progress Notes (Signed)
Lab ordered placed for blood work ?

## 2022-03-16 ENCOUNTER — Telehealth: Payer: Self-pay | Admitting: *Deleted

## 2022-03-16 LAB — CBC WITH DIFFERENTIAL/PLATELET
Basophils Absolute: 0.1 10*3/uL (ref 0.0–0.2)
Basos: 1 %
EOS (ABSOLUTE): 0.1 10*3/uL (ref 0.0–0.4)
Eos: 1 %
Hematocrit: 43.8 % (ref 37.5–51.0)
Hemoglobin: 15.2 g/dL (ref 13.0–17.7)
Immature Grans (Abs): 0 10*3/uL (ref 0.0–0.1)
Immature Granulocytes: 0 %
Lymphocytes Absolute: 1.1 10*3/uL (ref 0.7–3.1)
Lymphs: 14 %
MCH: 28.5 pg (ref 26.6–33.0)
MCHC: 34.7 g/dL (ref 31.5–35.7)
MCV: 82 fL (ref 79–97)
Monocytes Absolute: 0.8 10*3/uL (ref 0.1–0.9)
Monocytes: 10 %
Neutrophils Absolute: 5.6 10*3/uL (ref 1.4–7.0)
Neutrophils: 74 %
Platelets: 253 10*3/uL (ref 150–450)
RBC: 5.33 x10E6/uL (ref 4.14–5.80)
RDW: 14.8 % (ref 11.6–15.4)
WBC: 7.6 10*3/uL (ref 3.4–10.8)

## 2022-03-16 NOTE — H&P (Addendum)
New angina. ?Smokes. ?Abnormal Cor CTA with LAD and RCA severe disease and moderate circumflex. CT FFR modeling reviewed and demonstrates tortuosity.Severe 3 vessel disease. ?Creat 1.33 ?

## 2022-03-16 NOTE — Telephone Encounter (Signed)
Cardiac Catheterization scheduled at [facility] for: Thursday March 17, 2022 9 AM ?Arrival time and place: [facility] Main Entrance A at: 7 AM ? ? ?No solid food after midnight prior to cath, clear liquids until 5 AM day of procedure. ? ?Medication instructions: ?-Hold:  ? Valsartan/HCTZ-AM of procedure  ?-Except hold medications usual morning medications can be taken with sips of water including aspirin 81 mg. ? ?Confirmed patient has responsible adult to drive home post procedure and be with patient first 24 hours after arriving home. ? ?Patient reports no new symptoms concerning for COVID-19/no exposure to COVID-19 in the past 10 days. ? ?Reviewed procedure instructions with patient.  ?

## 2022-03-17 ENCOUNTER — Other Ambulatory Visit: Payer: Self-pay

## 2022-03-17 ENCOUNTER — Encounter: Payer: Self-pay | Admitting: Cardiology

## 2022-03-17 ENCOUNTER — Encounter (HOSPITAL_COMMUNITY)
Admission: RE | Disposition: A | Discharge status: Home / Self Care | Payer: Self-pay | Source: Home / Self Care | Attending: Interventional Cardiology

## 2022-03-17 ENCOUNTER — Encounter (HOSPITAL_COMMUNITY): Payer: Self-pay | Admitting: Interventional Cardiology

## 2022-03-17 ENCOUNTER — Ambulatory Visit (HOSPITAL_COMMUNITY)
Admission: RE | Admit: 2022-03-17 | Discharge: 2022-03-17 | Disposition: A | Discharge status: Home / Self Care | Payer: 59 | Attending: Interventional Cardiology | Admitting: Interventional Cardiology

## 2022-03-17 ENCOUNTER — Ambulatory Visit (HOSPITAL_BASED_OUTPATIENT_CLINIC_OR_DEPARTMENT_OTHER): Payer: 59

## 2022-03-17 DIAGNOSIS — I10 Essential (primary) hypertension: Secondary | ICD-10-CM | POA: Diagnosis present

## 2022-03-17 DIAGNOSIS — Z6827 Body mass index (BMI) 27.0-27.9, adult: Secondary | ICD-10-CM | POA: Insufficient documentation

## 2022-03-17 DIAGNOSIS — I119 Hypertensive heart disease without heart failure: Secondary | ICD-10-CM | POA: Insufficient documentation

## 2022-03-17 DIAGNOSIS — I2510 Atherosclerotic heart disease of native coronary artery without angina pectoris: Secondary | ICD-10-CM

## 2022-03-17 DIAGNOSIS — F17210 Nicotine dependence, cigarettes, uncomplicated: Secondary | ICD-10-CM | POA: Diagnosis not present

## 2022-03-17 DIAGNOSIS — E663 Overweight: Secondary | ICD-10-CM | POA: Diagnosis not present

## 2022-03-17 DIAGNOSIS — I209 Angina pectoris, unspecified: Secondary | ICD-10-CM

## 2022-03-17 DIAGNOSIS — I25110 Atherosclerotic heart disease of native coronary artery with unstable angina pectoris: Secondary | ICD-10-CM

## 2022-03-17 DIAGNOSIS — R079 Chest pain, unspecified: Secondary | ICD-10-CM | POA: Diagnosis present

## 2022-03-17 DIAGNOSIS — R072 Precordial pain: Secondary | ICD-10-CM | POA: Diagnosis present

## 2022-03-17 HISTORY — PX: LEFT HEART CATH AND CORONARY ANGIOGRAPHY: CATH118249

## 2022-03-17 LAB — ECHOCARDIOGRAM COMPLETE
Area-P 1/2: 3.62 cm2
Calc EF: 63.3 %
Height: 69 in
S' Lateral: 2.7 cm
Single Plane A2C EF: 66.9 %
Single Plane A4C EF: 60.8 %
Weight: 3040 oz

## 2022-03-17 SURGERY — LEFT HEART CATH AND CORONARY ANGIOGRAPHY
Anesthesia: LOCAL

## 2022-03-17 MED ORDER — VERAPAMIL HCL 2.5 MG/ML IV SOLN
INTRAVENOUS | Status: AC
  Filled 2022-03-17: qty 2

## 2022-03-17 MED ORDER — MIDAZOLAM HCL 2 MG/2ML IJ SOLN
INTRAMUSCULAR | Status: DC | PRN
  Administered 2022-03-17 (×2): 1 mg via INTRAVENOUS

## 2022-03-17 MED ORDER — FENTANYL CITRATE (PF) 100 MCG/2ML IJ SOLN
INTRAMUSCULAR | Status: AC
  Filled 2022-03-17: qty 2

## 2022-03-17 MED ORDER — LIDOCAINE HCL (PF) 1 % IJ SOLN
INTRAMUSCULAR | Status: AC
  Filled 2022-03-17: qty 30

## 2022-03-17 MED ORDER — SODIUM CHLORIDE 0.9 % IV SOLN: 250.0000 mL | INTRAVENOUS | Status: DC | PRN

## 2022-03-17 MED ORDER — SODIUM CHLORIDE 0.9% FLUSH: 3.0000 mL | INTRAVENOUS | Status: DC | PRN

## 2022-03-17 MED ORDER — LABETALOL HCL 5 MG/ML IV SOLN: 10.0000 mg | INTRAVENOUS | Status: DC | PRN

## 2022-03-17 MED ORDER — HEPARIN (PORCINE) IN NACL 1000-0.9 UT/500ML-% IV SOLN
INTRAVENOUS | Status: AC
  Filled 2022-03-17: qty 500

## 2022-03-17 MED ORDER — SODIUM CHLORIDE 0.9 % WEIGHT BASED INFUSION: 1.0000 mL/kg/h | INTRAVENOUS | Status: DC

## 2022-03-17 MED ORDER — ASPIRIN 81 MG PO CHEW: 81.0000 mg | CHEWABLE_TABLET | ORAL | Status: DC

## 2022-03-17 MED ORDER — METOPROLOL SUCCINATE ER 25 MG PO TB24: 25.0000 mg | ORAL_TABLET | Freq: Every day | ORAL | 11 refills | Status: DC

## 2022-03-17 MED ORDER — ASPIRIN 81 MG PO CHEW: 81.0000 mg | CHEWABLE_TABLET | Freq: Every day | ORAL | Status: DC

## 2022-03-17 MED ORDER — HYDRALAZINE HCL 20 MG/ML IJ SOLN: 10.0000 mg | INTRAMUSCULAR | Status: DC | PRN

## 2022-03-17 MED ORDER — FENTANYL CITRATE (PF) 100 MCG/2ML IJ SOLN
INTRAMUSCULAR | Status: DC | PRN
  Administered 2022-03-17 (×2): 25 ug via INTRAVENOUS

## 2022-03-17 MED ORDER — HEPARIN (PORCINE) IN NACL 1000-0.9 UT/500ML-% IV SOLN
INTRAVENOUS | Status: DC | PRN
  Administered 2022-03-17 (×2): 500 mL

## 2022-03-17 MED ORDER — MIDAZOLAM HCL 2 MG/2ML IJ SOLN
INTRAMUSCULAR | Status: AC
  Filled 2022-03-17: qty 2

## 2022-03-17 MED ORDER — NITROGLYCERIN 0.4 MG SL SUBL
0.4000 mg | SUBLINGUAL_TABLET | SUBLINGUAL | Status: AC | PRN
Start: 2022-03-17 — End: ?

## 2022-03-17 MED ORDER — ASPIRIN EC 81 MG PO TBEC: 81.0000 mg | DELAYED_RELEASE_TABLET | Freq: Every day | ORAL | 2 refills | Status: AC

## 2022-03-17 MED ORDER — SODIUM CHLORIDE 0.9 % IV SOLN: INTRAVENOUS | Status: DC

## 2022-03-17 MED ORDER — LIDOCAINE HCL (PF) 1 % IJ SOLN
INTRAMUSCULAR | Status: DC | PRN
  Administered 2022-03-17: 2 mL

## 2022-03-17 MED ORDER — VERAPAMIL HCL 2.5 MG/ML IV SOLN
INTRAVENOUS | Status: DC | PRN
  Administered 2022-03-17: 10 mL via INTRA_ARTERIAL

## 2022-03-17 MED ORDER — ACETAMINOPHEN 325 MG PO TABS: 650.0000 mg | ORAL_TABLET | ORAL | Status: DC | PRN

## 2022-03-17 MED ORDER — SODIUM CHLORIDE 0.9% FLUSH: 3.0000 mL | Freq: Two times a day (BID) | INTRAVENOUS | Status: DC

## 2022-03-17 MED ORDER — OXYCODONE HCL 5 MG PO TABS: 5.0000 mg | ORAL_TABLET | ORAL | Status: DC | PRN

## 2022-03-17 MED ORDER — ONDANSETRON HCL 4 MG/2ML IJ SOLN: 4.0000 mg | Freq: Four times a day (QID) | INTRAMUSCULAR | Status: DC | PRN

## 2022-03-17 MED ORDER — IOHEXOL 350 MG/ML SOLN
INTRAVENOUS | Status: DC | PRN
  Administered 2022-03-17: 80 mL

## 2022-03-17 MED ORDER — HEPARIN SODIUM (PORCINE) 1000 UNIT/ML IJ SOLN
INTRAMUSCULAR | Status: DC | PRN
  Administered 2022-03-17: 4500 [IU] via INTRAVENOUS

## 2022-03-17 MED ORDER — SODIUM CHLORIDE 0.9 % WEIGHT BASED INFUSION
3.0000 mL/kg/h | INTRAVENOUS | Status: AC
  Administered 2022-03-17: 3 mL/kg/h via INTRAVENOUS

## 2022-03-17 MED ORDER — HEPARIN SODIUM (PORCINE) 1000 UNIT/ML IJ SOLN
INTRAMUSCULAR | Status: AC
  Filled 2022-03-17: qty 10

## 2022-03-17 SURGICAL SUPPLY — 10 items
CATH 5FR JL3.5 JR4 ANG PIG MP (CATHETERS) ×1 IMPLANT
DEVICE RAD COMP TR BAND LRG (VASCULAR PRODUCTS) ×1 IMPLANT
GLIDESHEATH SLEND A-KIT 6F 22G (SHEATH) ×1 IMPLANT
GUIDEWIRE INQWIRE 1.5J.035X260 (WIRE) IMPLANT
INQWIRE 1.5J .035X260CM (WIRE) ×2
KIT HEART LEFT (KITS) ×3 IMPLANT
PACK CARDIAC CATHETERIZATION (CUSTOM PROCEDURE TRAY) ×3 IMPLANT
SHEATH PROBE COVER 6X72 (BAG) ×1 IMPLANT
TRANSDUCER W/STOPCOCK (MISCELLANEOUS) ×3 IMPLANT
TUBING CIL FLEX 10 FLL-RA (TUBING) ×3 IMPLANT

## 2022-03-17 NOTE — Progress Notes (Signed)
?  Echocardiogram ?2D Echocardiogram has been performed. ? ?John Delacruz ?03/17/2022, 11:33 AM ?

## 2022-03-17 NOTE — Interval H&P Note (Signed)
Cath Lab Visit (complete for each Cath Lab visit) ? ?Clinical Evaluation Leading to the Procedure:  ? ?ACS: No. ? ?Non-ACS:   ? ?Anginal Classification: CCS III ? ?Anti-ischemic medical therapy: Minimal Therapy (1 class of medications) ? ?Non-Invasive Test Results: High-risk stress test findings: cardiac mortality >3%/year ? ?Prior CABG: Previous CABG ? ? ? ? ? ?History and Physical Interval Note: ? ?03/17/2022 ?9:09 AM ? ?John Delacruz  has presented today for surgery, with the diagnosis of cad.  The various methods of treatment have been discussed with the patient and family. After consideration of risks, benefits and other options for treatment, the patient has consented to  Procedure(s): ?LEFT HEART CATH AND CORONARY ANGIOGRAPHY (N/A) as a surgical intervention.  The patient's history has been reviewed, patient examined, no change in status, stable for surgery.  I have reviewed the patient's chart and labs.  Questions were answered to the patient's satisfaction.   ? ? ?Belva Crome III ? ? ?

## 2022-03-17 NOTE — Progress Notes (Signed)
?  Echocardiogram ?2D Echocardiogram has been performed. ? ?Amos Micheals R ?03/17/2022, 11:33 AM ?

## 2022-03-18 ENCOUNTER — Encounter: Payer: Self-pay | Admitting: Surgery

## 2022-03-18 ENCOUNTER — Encounter: Payer: Self-pay | Admitting: *Deleted

## 2022-03-18 ENCOUNTER — Institutional Professional Consult (permissible substitution): Payer: 59 | Admitting: Surgery

## 2022-03-18 ENCOUNTER — Other Ambulatory Visit: Payer: Self-pay | Admitting: *Deleted

## 2022-03-18 VITALS — BP 157/90 | HR 60 | Resp 20 | Ht 69.0 in | Wt 190.0 lb

## 2022-03-18 DIAGNOSIS — I2510 Atherosclerotic heart disease of native coronary artery without angina pectoris: Secondary | ICD-10-CM

## 2022-03-18 MED ORDER — NITROGLYCERIN 0.4 MG SL SUBL: 0.4000 mg | SUBLINGUAL_TABLET | SUBLINGUAL | 3 refills | Status: DC | PRN

## 2022-03-18 NOTE — Progress Notes (Signed)
? ?Cardiothoracic Surgery Consultation ? ?PCP is Mila Palmer, MD ?Referring Provider is Lyn Records, MD ? ?Chief Complaint  ?Patient presents with  ? Coronary Artery Disease  ?  Surgical consult, Cardiac Cath and ECHO 03/18/22  ? ? ?HPI: ? ?The patient is a 61 year old gentleman with a history of hypertension and 1 pack/day smoking who reports about an 48-month history of exertional substernal chest discomfort which has been worsening.  It has been taking less exertion to bring on symptoms.  The chest discomfort has been relieved with rest.  He has had no rest symptoms.  He denies any shortness of breath or fatigue.  He was evaluated by Dr. Servando Salina on 02/28/2022 and underwent a gated coronary CTA with FFR showing significant three-vessel coronary disease.  Cardiac catheterization yesterday showed severe three-vessel coronary disease.  Echocardiogram showed an ejection fraction of 60 to 65% with mild concentric LVH.  There is no significant valvular abnormality. ? ?The patient is here today with his wife.  He still works.  He has remained active mowing his yard and riding a stationary bicycle.  He continues to smoke 1 pack of cigarettes per day and has for 30 years. ? ?Past Medical History:  ?Diagnosis Date  ? Hypertension   ? ? ?Past Surgical History:  ?Procedure Laterality Date  ? FRACTURE SURGERY    ? HERNIA REPAIR    ? LEFT HEART CATH AND CORONARY ANGIOGRAPHY N/A 03/17/2022  ? Procedure: LEFT HEART CATH AND CORONARY ANGIOGRAPHY;  Surgeon: Lyn Records, MD;  Location: [facility] INVASIVE CV LAB;  Service: Cardiovascular;  Laterality: N/A;  ? ? ?Family History  ?Problem Relation Age of Onset  ? Hypertension Mother   ? Kidney disease Mother   ? Lupus Sister   ? ? ?Social History ?Social History  ? ?Tobacco Use  ? Smoking status: Every Day  ?  Packs/day: 1.00  ?  Types: Cigarettes  ? Smokeless tobacco: Never  ?Vaping Use  ? Vaping Use: Never used  ?Substance Use Topics  ? Alcohol use: Yes  ?  Alcohol/week: 3.0 - 4.0  standard drinks  ?  Types: 3 - 4 Cans of beer per week  ?  Comment: daily  ? Drug use: No  ? ? ?Current Outpatient Medications  ?Medication Sig Dispense Refill  ? amLODipine (NORVASC) 10 MG tablet Take 10 mg by mouth daily.    ? aspirin EC 81 MG tablet Take 1 tablet (81 mg total) by mouth daily. Swallow whole. 150 tablet 2  ? Cyanocobalamin (VITAMIN B-12 PO) Take 1 tablet by mouth daily.    ? cyclobenzaprine (FLEXERIL) 10 MG tablet Take 1 tablet (10 mg total) by mouth 2 (two) times daily as needed for muscle spasms. 20 tablet 0  ? ibuprofen (ADVIL) 200 MG tablet Take 200 mg by mouth every 6 (six) hours as needed for moderate pain.    ? metoprolol succinate (TOPROL XL) 25 MG 24 hr tablet Take 1 tablet (25 mg total) by mouth daily. 30 tablet 11  ? nitroGLYCERIN (NITROSTAT) 0.4 MG SL tablet Place 1 tablet (0.4 mg total) under the tongue every 5 (five) minutes as needed for chest pain. 25 tablet 3  ? oxymetazoline (AFRIN) 0.05 % nasal spray Place 1 spray into both nostrils 2 (two) times daily as needed for congestion.    ? Turmeric 500 MG TABS Take 500 mg by mouth daily.    ? valsartan-hydrochlorothiazide (DIOVAN-HCT) 80-12.5 MG tablet Take 1 tablet by mouth daily.    ?  VITAMIN D PO Take 1 capsule by mouth daily.    ? ?Current Facility-Administered Medications  ?Medication Dose Route Frequency Provider Last Rate Last Admin  ? nitroGLYCERIN (NITROSTAT) SL tablet 0.4 mg  0.4 mg Sublingual Q5 min PRN Lyn RecordsSmith, Henry W, MD      ? ? ?Allergies  ?Allergen Reactions  ? Lisinopril   ?  Blurred vision ?  ? ? ?Review of Systems  ?Constitutional:  Positive for activity change. Negative for fatigue.  ?HENT: Negative.    ?Eyes: Negative.   ?Respiratory:  Negative for shortness of breath.   ?Cardiovascular:  Positive for chest pain. Negative for leg swelling.  ?Gastrointestinal:   ?     Frequent heartburn ? ?Difficulty swallowing  ?Endocrine: Negative.   ?Genitourinary:  Positive for frequency.  ?Musculoskeletal:  Positive for  arthralgias.  ?Skin: Negative.   ?Allergic/Immunologic: Negative.   ?Neurological:  Negative for dizziness and syncope.  ?Hematological: Negative.   ?Psychiatric/Behavioral: Negative.    ? ?BP (!) 157/90   Pulse 60   Resp 20   Ht 5\' 9"  (1.753 m)   Wt 190 lb (86.2 kg)   SpO2 95% Comment: RA  BMI 28.06 kg/m?  ?Physical Exam ?Constitutional:   ?   Appearance: Normal appearance. He is normal weight.  ?HENT:  ?   Head: Normocephalic and atraumatic.  ?   Mouth/Throat:  ?   Mouth: Mucous membranes are moist.  ?   Pharynx: Oropharynx is clear.  ?Eyes:  ?   Extraocular Movements: Extraocular movements intact.  ?   Conjunctiva/sclera: Conjunctivae normal.  ?   Pupils: Pupils are equal, round, and reactive to light.  ?Neck:  ?   Vascular: No carotid bruit.  ?Cardiovascular:  ?   Rate and Rhythm: Normal rate and regular rhythm.  ?   Pulses: Normal pulses.  ?   Heart sounds: Normal heart sounds. No murmur heard. ?Pulmonary:  ?   Effort: Pulmonary effort is normal.  ?   Breath sounds: Normal breath sounds.  ?Abdominal:  ?   General: Abdomen is flat. There is no distension.  ?   Palpations: Abdomen is soft.  ?Musculoskeletal:     ?   General: No swelling. Normal range of motion.  ?   Cervical back: Normal range of motion.  ?Skin: ?   General: Skin is warm and dry.  ?Neurological:  ?   General: No focal deficit present.  ?   Mental Status: He is alert and oriented to person, place, and time.  ?Psychiatric:     ?   Mood and Affect: Mood normal.     ?   Behavior: Behavior normal.  ? ? ? ?Diagnostic Tests: ? ?ADDENDUM REPORT: 03/15/2022 08:11 ?  ?EXAM: ?OVER-READ INTERPRETATION  CT CHEST ?  ?The following report is an over-read performed by radiologist Dr. ?Jeronimo GreavesKyle Talbot of [facility] [place] on 03/15/2022. This over-read ?does not include interpretation of cardiac or coronary anatomy or ?pathology. The coronary CTA interpretation by the cardiologist is ?attached. ?  ?COMPARISON:  None. ?  ?FINDINGS: ?Vascular: Aortic  atherosclerosis. No central pulmonary embolism, on ?this non-dedicated study. ?  ?Mediastinum/Nodes: No imaged thoracic adenopathy. ?  ?Lungs/Pleura: No pleural fluid.  Clear imaged lungs. ?  ?Upper Abdomen: Normal imaged portions of the liver, spleen, stomach. ?  ?Musculoskeletal: No acute osseous abnormality. ?  ?IMPRESSION: ?No acute findings in the imaged extracardiac chest. ?  ?Aortic Atherosclerosis (ICD10-I70.0). ?  ?  ?Electronically Signed ?  By: Jeronimo GreavesKyle  Talbot  M.D. ?  On: 03/15/2022 08:11 ?   ?Addended by Jeronimo Greaves, MD on 03/15/2022  8:13 AM  ? ?Study Result ? ?Narrative & Impression  ?CLINICAL DATA:  64M with chest pain ?  ?EXAM: ?Cardiac/Coronary CTA ?  ?TECHNIQUE: ?The patient was scanned on a [employer]. ?  ?FINDINGS: ?A 100 kV prospective scan was triggered in the descending thoracic ?aorta at 111 HU's. Axial non-contrast 3 mm slices were carried out ?through the heart. The data set was analyzed on a dedicated work ?station and scored using the Agatson method. Gantry rotation speed ?was 250 msecs and collimation was .6 mm. 0.8 mg of sl NTG was given. ?The 3D data set was reconstructed in 5% intervals of the 35-75 % of ?the R-R cycle. Phases were analyzed on a dedicated work station ?using MPR, MIP and VRT modes. The patient received 80 cc of ?contrast. ?  ?Coronary Arteries:  Normal coronary origin.  Right dominance. ?  ?RCA is a large dominant artery that gives rise to PDA and PLA. Mixed ?plaque in the proximal RCA causes 70-99% stenosis. Mixed plaque in ?the mid RCA causes 50-69% stenosis ?  ?Left main is a large artery that gives rise to LAD and LCX arteries. ?Calcified plaque in the left main causes 0-24% stenosis ?  ?LAD is a large vessel. Mixed plaque in the proximal LAD causes ?25-49% stenosis. Mixed plaque in the mid LAD causes 70-99% stenosis. ?High risk plaque features including positive remodeling, low ?attenuation plaque, spotty calcification, and napkin ring sign. ?  ?LCX is a  non-dominant artery that gives rise to one large OM1 ?branch. Mixed plaque in the mid to distal LCX causes 50-69% ?stenosis. Mixed plaque in the OM1 causes 50-69% stenosis ?  ?Other findings: ?  ?Left Ventricle: Normal size ?

## 2022-03-23 NOTE — Progress Notes (Signed)
Surgical Instructions ? ? ? Your procedure is scheduled on 03/28/22. ? Report to [facility] Main Entrance "A" at 5:30 A.M., then check in with the Admitting office. ? Call this number if you have problems the morning of surgery: ? [phone] ? ? If you have any questions prior to your surgery date call [phone]: Open Monday-Friday 8am-4pm ? ? ? Remember: ? Do not eat or drink after midnight the night before your surgery ? ? ?  ? Take these medicines the morning of surgery with A SIP OF WATER:  ?amLODipine (NORVASC) ?metoprolol succinate (TOPROL XL)  ? ?AS NEEDED: ?cyclobenzaprine (FLEXERIL) ?nitroGLYCERIN (NITROSTAT) ?oxymetazoline Vickie Epley)  ? ? ?As of today, STOP taking any Aleve, Naproxen, Ibuprofen, Motrin, Advil, Goody's, BC's, all herbal medications, fish oil, and all vitamins. ? ?         ?Do not wear jewelry  ?Do not wear lotions, powders, colognes, or deodorant. ?Do not shave 48 hours prior to surgery.  Men may shave face and neck. ?Do not bring valuables to the hospital. ? ? ?[facility] is not responsible for any belongings or valuables. .  ? ?Do NOT Smoke (Tobacco/Vaping)  24 hours prior to your procedure ? ?If you use a CPAP at night, you may bring your mask for your overnight stay. ?  ?Contacts, glasses, hearing aids, dentures or partials may not be worn into surgery, please bring cases for these belongings ?  ?For patients admitted to the hospital, discharge time will be determined by your treatment team. ?  ?Patients discharged the day of surgery will not be allowed to drive home, and someone needs to stay with them for 24 hours. ? ? ?SURGICAL WAITING ROOM VISITATION ?Patients having surgery or a procedure in a hospital may have two support people. ?Children under the age of 49 must have an adult with them who is not the patient. ?They may stay in the waiting area during the procedure and may switch out with other visitors. If the patient needs to stay at the hospital during part of their recovery,  the visitor guidelines for inpatient rooms apply. ? ?Please refer to the [facility] website for the visitor guidelines for Inpatients (after your surgery is over and you are in a regular room).  ? ? ? ?Special instructions:   ? ?Oral Hygiene is also important to reduce your risk of infection.  Remember - BRUSH YOUR TEETH THE MORNING OF SURGERY WITH YOUR REGULAR TOOTHPASTE ? ? ?[facility]- Preparing For Surgery ? ?Before surgery, you can play an important role. Because skin is not sterile, your skin needs to be as free of germs as possible. You can reduce the number of germs on your skin by washing with CHG (chlorahexidine gluconate) Soap before surgery.  CHG is an antiseptic cleaner which kills germs and bonds with the skin to continue killing germs even after washing.   ? ? ?Please do not use if you have an allergy to CHG or antibacterial soaps. If your skin becomes reddened/irritated stop using the CHG.  ?Do not shave (including legs and underarms) for at least 48 hours prior to first CHG shower. It is OK to shave your face. ? ?Please follow these instructions carefully. ?  ? ? Shower the NIGHT BEFORE SURGERY and the MORNING OF SURGERY with CHG Soap.  ? If you chose to wash your hair, wash your hair first as usual with your normal shampoo. After you shampoo, rinse your hair and body thoroughly to remove the shampoo.  Then [employer] and genitals (private parts) with your normal soap and rinse thoroughly to remove soap. ? ?After that Use CHG Soap as you would any other liquid soap. You can apply CHG directly to the skin and wash gently with a scrungie or a clean washcloth.  ? ?Apply the CHG Soap to your body ONLY FROM THE NECK DOWN.  Do not use on open wounds or open sores. Avoid contact with your eyes, ears, mouth and genitals (private parts). Wash Face and genitals (private parts)  with your normal soap.  ? ?Wash thoroughly, paying special attention to the area where your surgery will be performed. ? ?Thoroughly  rinse your body with warm water from the neck down. ? ?DO NOT shower/wash with your normal soap after using and rinsing off the CHG Soap. ? ?Pat yourself dry with a CLEAN TOWEL. ? ?Wear CLEAN PAJAMAS to bed the night before surgery ? ?Place CLEAN SHEETS on your bed the night before your surgery ? ?DO NOT SLEEP WITH PETS. ? ? ?Day of Surgery: ? ?Take a shower with CHG soap. ?Wear Clean/Comfortable clothing the morning of surgery ?Do not apply any deodorants/lotions.   ?Remember to brush your teeth WITH YOUR REGULAR TOOTHPASTE. ? ? ? ?If you received a COVID test during your pre-op visit, it is requested that you wear a mask when out in public, stay away from anyone that may not be feeling well, and notify your surgeon if you develop symptoms. If you have been in contact with anyone that has tested positive in the last 10 days, please notify your surgeon. ? ?  ?Please read over the following fact sheets that you were given.   ?

## 2022-03-24 ENCOUNTER — Encounter (HOSPITAL_COMMUNITY)
Admission: RE | Admit: 2022-03-24 | Discharge: 2022-03-24 | Disposition: A | Discharge status: Home / Self Care | Payer: 59 | Source: Ambulatory Visit | Attending: Surgery | Admitting: Surgery

## 2022-03-24 ENCOUNTER — Ambulatory Visit (HOSPITAL_COMMUNITY)
Admission: RE | Admit: 2022-03-24 | Discharge: 2022-03-24 | Disposition: A | Discharge status: Home / Self Care | Payer: 59 | Source: Ambulatory Visit | Attending: Surgery | Admitting: Surgery

## 2022-03-24 ENCOUNTER — Encounter (HOSPITAL_COMMUNITY): Payer: Self-pay

## 2022-03-24 ENCOUNTER — Other Ambulatory Visit: Payer: Self-pay

## 2022-03-24 VITALS — BP 128/83 | HR 57 | Temp 97.8°F | Resp 18 | Ht 69.0 in | Wt 188.0 lb

## 2022-03-24 DIAGNOSIS — Z20822 Contact with and (suspected) exposure to covid-19: Secondary | ICD-10-CM | POA: Insufficient documentation

## 2022-03-24 DIAGNOSIS — N182 Chronic kidney disease, stage 2 (mild): Secondary | ICD-10-CM | POA: Insufficient documentation

## 2022-03-24 DIAGNOSIS — F17200 Nicotine dependence, unspecified, uncomplicated: Secondary | ICD-10-CM | POA: Diagnosis not present

## 2022-03-24 DIAGNOSIS — I129 Hypertensive chronic kidney disease with stage 1 through stage 4 chronic kidney disease, or unspecified chronic kidney disease: Secondary | ICD-10-CM | POA: Insufficient documentation

## 2022-03-24 DIAGNOSIS — F1010 Alcohol abuse, uncomplicated: Secondary | ICD-10-CM | POA: Diagnosis not present

## 2022-03-24 DIAGNOSIS — Z01818 Encounter for other preprocedural examination: Secondary | ICD-10-CM | POA: Diagnosis not present

## 2022-03-24 DIAGNOSIS — I2510 Atherosclerotic heart disease of native coronary artery without angina pectoris: Secondary | ICD-10-CM | POA: Insufficient documentation

## 2022-03-24 HISTORY — DX: Atherosclerotic heart disease of native coronary artery without angina pectoris: I25.10

## 2022-03-24 LAB — COMPREHENSIVE METABOLIC PANEL
ALT: 18 U/L (ref 0–44)
AST: 22 U/L (ref 15–41)
Albumin: 4.1 g/dL (ref 3.5–5.0)
Alkaline Phosphatase: 53 U/L (ref 38–126)
Anion gap: 8 (ref 5–15)
BUN: 16 mg/dL (ref 8–23)
CO2: 24 mmol/L (ref 22–32)
Calcium: 9.8 mg/dL (ref 8.9–10.3)
Chloride: 104 mmol/L (ref 98–111)
Creatinine, Ser: 1.33 mg/dL — ABNORMAL HIGH (ref 0.61–1.24)
GFR, Estimated: >60 mL/min (ref >60)
Glucose, Bld: 110 mg/dL — ABNORMAL HIGH (ref 70–99)
Potassium: 4 mmol/L (ref 3.5–5.1)
Sodium: 136 mmol/L (ref 135–145)
Total Bilirubin: 0.6 mg/dL (ref 0.3–1.2)
Total Protein: 6.9 g/dL (ref 6.5–8.1)

## 2022-03-24 LAB — URINALYSIS, ROUTINE W REFLEX MICROSCOPIC
Bilirubin Urine: NEGATIVE
Glucose, UA: NEGATIVE mg/dL
Hgb urine dipstick: NEGATIVE
Ketones, ur: NEGATIVE mg/dL
Leukocytes,Ua: NEGATIVE
Nitrite: NEGATIVE
Protein, ur: NEGATIVE mg/dL
Specific Gravity, Urine: 1.017 (ref 1.005–1.030)
pH: 5 (ref 5.0–8.0)

## 2022-03-24 LAB — BLOOD GAS, ARTERIAL
Acid-Base Excess: 1.7 mmol/L (ref 0.0–2.0)
Bicarbonate: 25.8 mmol/L (ref 20.0–28.0)
Drawn by: 58793
O2 Saturation: 100 %
Patient temperature: 37
pCO2 arterial: 38 mmHg (ref 32–48)
pH, Arterial: 7.44 (ref 7.35–7.45)
pO2, Arterial: 100 mmHg (ref 83–108)

## 2022-03-24 LAB — SURGICAL PCR SCREEN
MRSA, PCR: NEGATIVE
Staphylococcus aureus: NEGATIVE

## 2022-03-24 LAB — CBC
HCT: 44.5 % (ref 39.0–52.0)
Hemoglobin: 15.3 g/dL (ref 13.0–17.0)
MCH: 29.1 pg (ref 26.0–34.0)
MCHC: 34.4 g/dL (ref 30.0–36.0)
MCV: 84.6 fL (ref 80.0–100.0)
Platelets: 252 10*3/uL (ref 150–400)
RBC: 5.26 MIL/uL (ref 4.22–5.81)
RDW: 14.4 % (ref 11.5–15.5)
WBC: 7 10*3/uL (ref 4.0–10.5)
nRBC: 0 % (ref 0.0–0.2)

## 2022-03-24 LAB — TYPE AND SCREEN
ABO/RH(D): O POS
Antibody Screen: NEGATIVE

## 2022-03-24 LAB — APTT: aPTT: 29 seconds (ref 24–36)

## 2022-03-24 LAB — PROTIME-INR
INR: 1 (ref 0.8–1.2)
Prothrombin Time: 12.8 seconds (ref 11.4–15.2)

## 2022-03-24 LAB — HEMOGLOBIN A1C
Hgb A1c MFr Bld: 5.9 % — ABNORMAL HIGH (ref 4.8–5.6)
Mean Plasma Glucose: 122.63 mg/dL

## 2022-03-24 NOTE — Progress Notes (Addendum)
PCP - Jonathon Jordan MD ?Cardiologist - Berniece Salines MD ? ?PPM/ICD - denies ?Device Orders -  ?Rep Notified -  ? ?Chest x-ray - 03/24/22 ?EKG - 03/18/22 ?Stress Test - none ?ECHO - 03/17/22 ?Cardiac Cath - 03/17/22 ? ?Sleep Study - none ?CPAP -  ? ?Fasting Blood Sugar -n/a  ?Checks Blood Sugar _____ times a day ? ?Blood Thinner Instructions:n/a ?Aspirin Instructions: per surgeon. ? ?ERAS Protcol -no ?PRE-SURGERY Ensure or G2-  ? ?COVID TEST- 03/24/22 ? ? ?Anesthesia review:yes -cardiac  ? ?Patient denies shortness of breath, fever, cough and chest pain at PAT appointment ? ? ?All instructions explained to the patient, with a verbal understanding of the material. Patient agrees to go over the instructions while at home for a better understanding. Patient also instructed to wear a mask after being tested for COVID-19. The opportunity to ask questions was provided. ?  ?

## 2022-03-24 NOTE — Progress Notes (Signed)
Anesthesia Chart Review: ? Case: [id number] Date/Time: 03/28/22 0715  ? Procedures:  ?    CORONARY ARTERY BYPASS GRAFTING (CABG) (Chest) ?    TRANSESOPHAGEAL ECHOCARDIOGRAM (TEE)  ? Anesthesia type: General  ? Pre-op diagnosis: CAD  ? Location: [facility] OR ROOM 15 / [facility] OR  ? Surgeons: Gaye Pollack, MD  ? ?  ? ? ?DISCUSSION: Patient is a 61 year old male scheduled for the above procedure. ? ?History includes smoking, HTN, angina/CAD. Labs suggest CKD stage II (Cr 1.33-1.64 in [facility] labs since 12/18/13). EtOH intake is documented as 3-4 beers/day. ? ?03/24/22 presurgical COVID-19 test is in process. Anesthesia team to evaluate on the day of surgery.  ? ? ?VS: BP 128/83   Pulse (!) 57   Temp 36.6 ?C (Oral)   Resp 18   Ht 5\' 9"  (1.753 m)   Wt 85.3 kg   SpO2 100%   BMI 27.76 kg/m?  ? ? ?PROVIDERS: ?Jonathon Jordan, MD is PCP  ?Berniece Salines, DO is cardiologist ? ? ?LABS: Labs reviewed: Acceptable for surgery. ?(all labs ordered are listed, but only abnormal results are displayed) ? ?Labs Reviewed  ?COMPREHENSIVE METABOLIC PANEL - Abnormal; Notable for the following components:  ?    Result Value  ? Glucose, Bld 110 (*)   ? Creatinine, Ser 1.33 (*)   ? All other components within normal limits  ?HEMOGLOBIN A1C - Abnormal; Notable for the following components:  ? Hgb A1c MFr Bld 5.9 (*)   ? All other components within normal limits  ?SURGICAL PCR SCREEN  ?SARS CORONAVIRUS 2 (TAT 6-24 HRS)  ?CBC  ?PROTIME-INR  ?APTT  ?URINALYSIS, ROUTINE W REFLEX MICROSCOPIC  ?BLOOD GAS, ARTERIAL  ?TYPE AND SCREEN  ? ? ? ?IMAGES: ?CXR 03/24/22: ?FINDINGS: ?The heart size and mediastinal contours are within normal limits. ?Both lungs are clear. The visualized skeletal structures are ?unremarkable. ?IMPRESSION: ?No active cardiopulmonary disease. ? ? ?EKG: ?EKG 02/28/22: NSR. Septal infarct (age undetermined) ? ? ?CV: ?US Carotid 03/24/22: ?Summary:  ?- Right Carotid: The extracranial vessels were near-normal with only minimal wall thickening or plaque.   ?- Left Carotid: The extracranial vessels were near-normal with only minimal wall thickening or plaque.  ?- Vertebrals:  Bilateral vertebral arteries demonstrate antegrade flow.  ?- Subclavians: Normal flow hemodynamics were seen in bilateral subclavian arteries.  ? ? ?Cardiac cath 03/17/22: ?CONCLUSIONS: ?Normal left main ?Large tortuous LAD that is transapical.  Mid vessel 95% stenosis within a significant bend.  Proximal to the severe stenosis is diffuse 40% narrowing. ?Circumflex gives origin to 3 obtuse marginals.  The first marginal is large, tortuous, and contains a 75 to 80% stenosis in the apex of the initial angulation.  The distal circumflex after the origin of a small second obtuse marginal there is 90% stenosis. The third marginal itself contains a proximal 80% stenosis. ?The RCA is dominant.  There is segmental 60 to 70% proximal to mid stenosis (FFR positive by CT FFR), followed by tortuosity before a 60% distal stenosis. ?Normal LVEDP with EF 65%. ?  ?RECOMMENDATIONS: ?  ?Given significant angulation and tortuosity, I believe the patient would do better with multivessel bypass surgery including a LIMA to LAD with saphenous vein grafts to the first and third obtuse marginal into the distal right coronary right ventricular branch... ? ? ?Echo 03/17/22: ?IMPRESSIONS  ? 1. Left ventricular ejection fraction, by estimation, is 60 to 65%. The  ?left ventricle has normal function. The left ventricle has no regional  ?wall motion abnormalities.  There is mild concentric left ventricular  ?hypertrophy. Left ventricular diastolic  ?parameters were normal.  ? 2. Right ventricular systolic function is normal. The right ventricular  ?size is normal.  ? 3. The mitral valve is normal in structure. No evidence of mitral valve  ?regurgitation. No evidence of mitral stenosis.  ? 4. The aortic valve is tricuspid. There is mild calcification of the  ?aortic valve. There is mild thickening of the aortic valve. Aortic valve   ?regurgitation is trivial. Aortic valve sclerosis is present, with no  ?evidence of aortic valve stenosis.  ? 5. The inferior vena cava is normal in size with greater than 50%  ?respiratory variability, suggesting right atrial pressure of 3 mmHg.  ? ? ?Past Medical History:  ?Diagnosis Date  ? Anginal pain ([place])   ? Coronary artery disease   ? Hypertension   ? ? ?Past Surgical History:  ?Procedure Laterality Date  ? FRACTURE SURGERY    ? HERNIA REPAIR    ? LEFT HEART CATH AND CORONARY ANGIOGRAPHY N/A 03/17/2022  ? Procedure: LEFT HEART CATH AND CORONARY ANGIOGRAPHY;  Surgeon: Belva Crome, MD;  Location: [facility] CV LAB;  Service: Cardiovascular;  Laterality: N/A;  ? ? ?MEDICATIONS: ? amLODipine (NORVASC) 10 MG tablet  ? aspirin EC 81 MG tablet  ? Cyanocobalamin (VITAMIN B-12 PO)  ? cyclobenzaprine (FLEXERIL) 10 MG tablet  ? ibuprofen (ADVIL) 200 MG tablet  ? metoprolol succinate (TOPROL XL) 25 MG 24 hr tablet  ? nitroGLYCERIN (NITROSTAT) 0.4 MG SL tablet  ? oxymetazoline (AFRIN) 0.05 % nasal spray  ? Turmeric 500 MG TABS  ? valsartan-hydrochlorothiazide (DIOVAN-HCT) 80-12.5 MG tablet  ? VITAMIN D PO  ? ? nitroGLYCERIN (NITROSTAT) SL tablet 0.4 mg  ? ? ?Myra Gianotti, PA-C ?Surgical Short Stay/Anesthesiology ?[facility] Phone [phone] ?[facility] Phone [phone] ?03/24/2022 5:36 PM ? ? ? ? ? ? ?

## 2022-03-24 NOTE — Progress Notes (Signed)
VASCULAR LAB ? ? ? ?Pre CABG Dopplers have been performed. ? ?See CV proc for preliminary results. ? ? ?Veneda Kirksey, RVT ?03/24/2022, 8:52 AM ? ?

## 2022-03-24 NOTE — Anesthesia Preprocedure Evaluation (Addendum)
Anesthesia Evaluation  ?Patient identified by MRN, date of birth, ID band ?Patient awake ? ? ? ?Reviewed: ?Allergy & Precautions, NPO status , Patient's Chart, lab work & pertinent test results ? ?History of Anesthesia Complications ?Negative for: history of anesthetic complications ? ?Airway ?Mallampati: II ? ?TM Distance: >3 FB ?Neck ROM: Full ? ? ? Dental ? ?(+) Dental Advisory Given, Partial Upper, Missing ?  ?Pulmonary ?neg shortness of breath, neg sleep apnea, neg COPD, neg recent URI, Current Smoker and Patient abstained from smoking.,  ?  ?breath sounds clear to auscultation ? ? ? ? ? ? Cardiovascular ?hypertension, Pt. on medications and Pt. on home beta blockers ?+ angina + CAD  ? ?Rhythm:Regular  ??1. Left ventricular ejection fraction, by estimation, is 60 to 65%. The  ?left ventricle has normal function. The left ventricle has no regional  ?wall motion abnormalities. There is mild concentric left ventricular  ?hypertrophy. Left ventricular diastolic  ?parameters were normal.  ??2. Right ventricular systolic function is normal. The right ventricular  ?size is normal.  ??3. The mitral valve is normal in structure. No evidence of mitral valve  ?regurgitation. No evidence of mitral stenosis.  ??4. The aortic valve is tricuspid. There is mild calcification of the  ?aortic valve. There is mild thickening of the aortic valve. Aortic valve  ?regurgitation is trivial. Aortic valve sclerosis is present, with no  ?evidence of aortic valve stenosis.  ??5. The inferior vena cava is normal in size with greater than 50%  ?respiratory variability, suggesting right atrial pressure of 3 mmHg.  ? ? ?? Normal left main ?? Large tortuous LAD that is transapical.  Mid vessel 95% stenosis within a significant bend.  Proximal to the severe stenosis is diffuse 40% narrowing. ?? Circumflex gives origin to 3 obtuse marginals.  The first marginal is large, tortuous, and contains a 75 to 80%  stenosis in the apex of the initial angulation.  The distal circumflex after the origin of a small second obtuse marginal there is 90% stenosis. The third marginal itself contains a proximal 80% stenosis. ?? The RCA is dominant.  There is segmental 60 to 70% proximal to mid stenosis (FFR positive by CT FFR), followed by tortuosity before a 60% distal stenosis. ?? Normal LVEDP with EF 65%. ?? ?RECOMMENDATIONS: ?? ?? Given significant angulation and tortuosity, I believe the patient would do better with multivessel bypass surgery including a LIMA to LAD with saphenous vein grafts to the first and third obtuse marginal into the distal right coronary right ventricular branch. ?? We will expedite visit with the surgical team ?? We will order 2D Doppler echocardiogram to be done prior to discharge today. ?? Precautions for return to the hospital are given.  Nitroglycerin is given. ?? Discussed with patient and family. ? ?  ?Neuro/Psych ?negative neurological ROS ? negative psych ROS  ? GI/Hepatic ?negative GI ROS, Neg liver ROS,   ?Endo/Other  ?negative endocrine ROS ? Renal/GU ?Renal InsufficiencyRenal diseaseLab Results ?     Component                Value               Date                 ?     CREATININE               1.33 (H)            03/24/2022           ?  ? ?  ?  Musculoskeletal ?negative musculoskeletal ROS ?(+)  ? Abdominal ?  ?Peds ? Hematology ?negative hematology ROS ?(+) Lab Results ?     Component                Value               Date                 ?     WBC                      7.0                 03/24/2022           ?     HGB                      15.3                03/24/2022           ?     HCT                      44.5                03/24/2022           ?     MCV                      84.6                03/24/2022           ?     PLT                      252                 03/24/2022           ?   ?Anesthesia Other Findings ? ? Reproductive/Obstetrics ? ?  ? ? ? ? ? ? ? ? ? ? ? ? ? ?  ?   ? ? ? ? ? ? ? ?Anesthesia Physical ?Anesthesia Plan ? ?ASA: 4 ? ?Anesthesia Plan: General  ? ?Post-op Pain Management:   ? ?Induction: Intravenous ? ?PONV Risk Score and Plan: 1 ? ?Airway Management Planned: Oral ETT ? ?Additional Equipment: Arterial line, CVP, PA Cath, TEE and Ultrasound Guidance Line Placement ? ?Intra-op Plan:  ? ?Post-operative Plan: Post-operative intubation/ventilation ? ?Informed Consent: I have reviewed the patients History and Physical, chart, labs and discussed the procedure including the risks, benefits and alternatives for the proposed anesthesia with the patient or authorized representative who has indicated his/her understanding and acceptance.  ? ? ? ?Dental advisory given ? ?Plan Discussed with: CRNA ? ?Anesthesia Plan Comments: (PAT note written 03/24/2022 by Shonna Chock, PA-C. ?)  ? ? ? ? ? ?Anesthesia Quick Evaluation ? ?

## 2022-03-25 LAB — SARS CORONAVIRUS 2 (TAT 6-24 HRS): SARS Coronavirus 2: NEGATIVE

## 2022-03-25 MED ORDER — VANCOMYCIN HCL 1500 MG/300ML IV SOLN
1500.0000 mg | INTRAVENOUS | Status: DC
  Filled 2022-03-25: qty 300

## 2022-03-25 MED ORDER — CEFAZOLIN SODIUM-DEXTROSE 2-4 GM/100ML-% IV SOLN
2.0000 g | INTRAVENOUS | Status: DC
  Filled 2022-03-25: qty 100

## 2022-03-25 MED ORDER — NITROGLYCERIN IN D5W 200-5 MCG/ML-% IV SOLN
2.0000 ug/min | INTRAVENOUS | Status: DC
  Filled 2022-03-25: qty 250

## 2022-03-25 MED ORDER — NOREPINEPHRINE 4 MG/250ML-% IV SOLN
0.0000 ug/min | INTRAVENOUS | Status: DC
  Filled 2022-03-25: qty 250

## 2022-03-25 MED ORDER — EPINEPHRINE HCL 5 MG/250ML IV SOLN IN NS
0.0000 ug/min | INTRAVENOUS | Status: DC
  Filled 2022-03-25: qty 250

## 2022-03-25 MED ORDER — PHENYLEPHRINE HCL-NACL 20-0.9 MG/250ML-% IV SOLN
30.0000 ug/min | INTRAVENOUS | Status: DC
  Filled 2022-03-25: qty 250

## 2022-03-25 MED ORDER — TRANEXAMIC ACID 1000 MG/10ML IV SOLN
1.5000 mg/kg/h | INTRAVENOUS | Status: DC
  Filled 2022-03-25: qty 25

## 2022-03-25 MED ORDER — PLASMA-LYTE A IV SOLN
INTRAVENOUS | Status: DC
  Filled 2022-03-25: qty 2.5

## 2022-03-25 MED ORDER — POTASSIUM CHLORIDE 2 MEQ/ML IV SOLN
80.0000 meq | INTRAVENOUS | Status: DC
Start: 2022-03-26 — End: 2022-03-27
  Filled 2022-03-25: qty 40

## 2022-03-25 MED ORDER — TRANEXAMIC ACID (OHS) PUMP PRIME SOLUTION
2.0000 mg/kg | INTRAVENOUS | Status: DC
  Filled 2022-03-25: qty 1.71

## 2022-03-25 MED ORDER — MILRINONE LACTATE IN DEXTROSE 20-5 MG/100ML-% IV SOLN
0.3000 ug/kg/min | INTRAVENOUS | Status: DC
  Filled 2022-03-25: qty 100

## 2022-03-25 MED ORDER — HEPARIN 30,000 UNITS/1000 ML (OHS) CELLSAVER SOLUTION
Status: DC
Start: 2022-03-26 — End: 2022-03-27
  Filled 2022-03-25: qty 1000

## 2022-03-25 MED ORDER — MAGNESIUM SULFATE 50 % IJ SOLN
40.0000 meq | INTRAMUSCULAR | Status: DC
  Filled 2022-03-25: qty 9.85

## 2022-03-25 MED ORDER — DEXMEDETOMIDINE HCL IN NACL 400 MCG/100ML IV SOLN
0.1000 ug/kg/h | INTRAVENOUS | Status: DC
  Filled 2022-03-25: qty 100

## 2022-03-25 MED ORDER — TRANEXAMIC ACID (OHS) BOLUS VIA INFUSION
15.0000 mg/kg | INTRAVENOUS | Status: DC
  Filled 2022-03-25: qty 1280

## 2022-03-25 MED ORDER — INSULIN REGULAR(HUMAN) IN NACL 100-0.9 UT/100ML-% IV SOLN
INTRAVENOUS | Status: DC
  Filled 2022-03-25: qty 100

## 2022-03-27 MED ORDER — INSULIN REGULAR(HUMAN) IN NACL 100-0.9 UT/100ML-% IV SOLN
INTRAVENOUS | Status: AC
  Administered 2022-03-28: 2.2 [IU]/h via INTRAVENOUS
  Filled 2022-03-27: qty 100

## 2022-03-27 MED ORDER — TRANEXAMIC ACID (OHS) PUMP PRIME SOLUTION
2.0000 mg/kg | INTRAVENOUS | Status: DC
  Filled 2022-03-27 (×2): qty 1.71

## 2022-03-27 MED ORDER — CEFAZOLIN SODIUM-DEXTROSE 2-4 GM/100ML-% IV SOLN
2.0000 g | INTRAVENOUS | Status: DC
Start: 2022-03-28 — End: 2022-03-28
  Filled 2022-03-27: qty 100

## 2022-03-27 MED ORDER — NITROGLYCERIN IN D5W 200-5 MCG/ML-% IV SOLN
2.0000 ug/min | INTRAVENOUS | Status: DC
  Filled 2022-03-27: qty 250

## 2022-03-27 MED ORDER — EPINEPHRINE HCL 5 MG/250ML IV SOLN IN NS
0.0000 ug/min | INTRAVENOUS | Status: DC
  Filled 2022-03-27: qty 250

## 2022-03-27 MED ORDER — PHENYLEPHRINE HCL-NACL 20-0.9 MG/250ML-% IV SOLN
30.0000 ug/min | INTRAVENOUS | Status: AC
  Administered 2022-03-28: 20 ug/min via INTRAVENOUS
  Filled 2022-03-27: qty 250

## 2022-03-27 MED ORDER — CEFAZOLIN SODIUM-DEXTROSE 2-4 GM/100ML-% IV SOLN
2.0000 g | INTRAVENOUS | Status: AC
  Administered 2022-03-28 (×2): 2 g via INTRAVENOUS
  Filled 2022-03-27: qty 100

## 2022-03-27 MED ORDER — NOREPINEPHRINE 4 MG/250ML-% IV SOLN
0.0000 ug/min | INTRAVENOUS | Status: DC
  Filled 2022-03-27: qty 250

## 2022-03-27 MED ORDER — DEXMEDETOMIDINE HCL IN NACL 400 MCG/100ML IV SOLN
0.1000 ug/kg/h | INTRAVENOUS | Status: AC
  Administered 2022-03-28: .5 ug/kg/h via INTRAVENOUS
  Filled 2022-03-27: qty 100

## 2022-03-27 MED ORDER — MAGNESIUM SULFATE 50 % IJ SOLN
40.0000 meq | INTRAMUSCULAR | Status: DC
  Filled 2022-03-27: qty 9.85

## 2022-03-27 MED ORDER — PAPAVERINE HCL 30 MG/ML IJ SOLN
INTRAMUSCULAR | Status: DC
  Filled 2022-03-27: qty 2.5

## 2022-03-27 MED ORDER — POTASSIUM CHLORIDE 2 MEQ/ML IV SOLN
80.0000 meq | INTRAVENOUS | Status: DC
  Filled 2022-03-27: qty 40

## 2022-03-27 MED ORDER — TRANEXAMIC ACID (OHS) BOLUS VIA INFUSION
15.0000 mg/kg | INTRAVENOUS | Status: AC
  Administered 2022-03-28: 1060.5 mg via INTRAVENOUS
  Filled 2022-03-27: qty 1061

## 2022-03-27 MED ORDER — HEPARIN 30,000 UNITS/1000 ML (OHS) CELLSAVER SOLUTION
Status: DC
Start: 2022-03-28 — End: 2022-03-28
  Filled 2022-03-27: qty 1000

## 2022-03-27 MED ORDER — MILRINONE LACTATE IN DEXTROSE 20-5 MG/100ML-% IV SOLN
0.3000 ug/kg/min | INTRAVENOUS | Status: DC
  Filled 2022-03-27: qty 100

## 2022-03-27 MED ORDER — TRANEXAMIC ACID 1000 MG/10ML IV SOLN
1.5000 mg/kg/h | INTRAVENOUS | Status: AC
  Administered 2022-03-28: 1.5 mg/kg/h via INTRAVENOUS
  Filled 2022-03-27: qty 25

## 2022-03-27 MED ORDER — VANCOMYCIN HCL 1500 MG/300ML IV SOLN
1500.0000 mg | INTRAVENOUS | Status: AC
  Administered 2022-03-28: 1500 mg via INTRAVENOUS
  Filled 2022-03-27: qty 300

## 2022-03-27 NOTE — H&P (Signed)
? ?   ?[address] ?      [name] [address] ?            [phone]   ? ?  ?Cardiothoracic Surgery Admission History and Physical ? ? ?PCP is Mila Palmer, MD ?Referring Provider is Lyn Records, MD ?  ?    ?Chief Complaint  ?Patient presents with  ? Coronary Artery Disease  ?     ?  ?  ?HPI: ?  ?The patient is a 61 year old gentleman with a history of hypertension and 1 pack/day smoking who reports about an 63-month history of exertional substernal chest discomfort which has been worsening.  It has been taking less exertion to bring on symptoms.  The chest discomfort has been relieved with rest.  He has had no rest symptoms.  He denies any shortness of breath or fatigue.  He was evaluated by Dr. Servando Salina on 02/28/2022 and underwent a gated coronary CTA with FFR showing significant three-vessel coronary disease.  Cardiac catheterization yesterday showed severe three-vessel coronary disease.  Echocardiogram showed an ejection fraction of 60 to 65% with mild concentric LVH.  There is no significant valvular abnormality. ?  ?The patient lives with his wife.  He still works.  He has remained active mowing his yard and riding a stationary bicycle.  He continues to smoke 1 pack of cigarettes per day and has for 30 years. ?  ?    ?Past Medical History:  ?Diagnosis Date  ? Hypertension    ?  ?  ?     ?Past Surgical History:  ?Procedure Laterality Date  ? FRACTURE SURGERY      ? HERNIA REPAIR      ? LEFT HEART CATH AND CORONARY ANGIOGRAPHY N/A 03/17/2022  ?  Procedure: LEFT HEART CATH AND CORONARY ANGIOGRAPHY;  Surgeon: Lyn Records, MD;  Location: [facility] INVASIVE CV LAB;  Service: Cardiovascular;  Laterality: N/A;  ?  ?  ?     ?Family History  ?Problem Relation Age of Onset  ? Hypertension Mother    ? Kidney disease Mother    ? Lupus Sister    ?  ?  ?Social History ?Social History  ?  ?     ?Tobacco Use  ? Smoking status: Every Day  ?    Packs/day: 1.00  ?    Types: Cigarettes  ? Smokeless tobacco: Never   ?Vaping Use  ? Vaping Use: Never used  ?Substance Use Topics  ? Alcohol use: Yes  ?    Alcohol/week: 3.0 - 4.0 standard drinks  ?    Types: 3 - 4 Cans of beer per week  ?    Comment: daily  ? Drug use: No  ?  ?  ?      ?Current Outpatient Medications  ?Medication Sig Dispense Refill  ? amLODipine (NORVASC) 10 MG tablet Take 10 mg by mouth daily.      ? aspirin EC 81 MG tablet Take 1 tablet (81 mg total) by mouth daily. Swallow whole. 150 tablet 2  ? Cyanocobalamin (VITAMIN B-12 PO) Take 1 tablet by mouth daily.      ? cyclobenzaprine (FLEXERIL) 10 MG tablet Take 1 tablet (10 mg total) by mouth 2 (two) times daily as needed for muscle spasms. 20 tablet 0  ? ibuprofen (ADVIL) 200 MG tablet Take 200 mg by mouth every 6 (six) hours as needed for moderate pain.      ? metoprolol succinate (TOPROL XL)  25 MG 24 hr tablet Take 1 tablet (25 mg total) by mouth daily. 30 tablet 11  ? nitroGLYCERIN (NITROSTAT) 0.4 MG SL tablet Place 1 tablet (0.4 mg total) under the tongue every 5 (five) minutes as needed for chest pain. 25 tablet 3  ? oxymetazoline (AFRIN) 0.05 % nasal spray Place 1 spray into both nostrils 2 (two) times daily as needed for congestion.      ? Turmeric 500 MG TABS Take 500 mg by mouth daily.      ? valsartan-hydrochlorothiazide (DIOVAN-HCT) 80-12.5 MG tablet Take 1 tablet by mouth daily.      ? VITAMIN D PO Take 1 capsule by mouth daily.      ?  ?         ?Current Facility-Administered Medications  ?Medication Dose Route Frequency Provider Last Rate Last Admin  ? nitroGLYCERIN (NITROSTAT) SL tablet 0.4 mg  0.4 mg Sublingual Q5 min PRN Lyn Records, MD      ?  ?  ?     ?Allergies  ?Allergen Reactions  ? Lisinopril    ?    Blurred vision ?   ?  ?  ?Review of Systems  ?Constitutional:  Positive for activity change. Negative for fatigue.  ?HENT: Negative.    ?Eyes: Negative.   ?Respiratory:  Negative for shortness of breath.   ?Cardiovascular:  Positive for chest pain. Negative for leg swelling.   ?Gastrointestinal:   ?     Frequent heartburn ?  ?Difficulty swallowing  ?Endocrine: Negative.   ?Genitourinary:  Positive for frequency.  ?Musculoskeletal:  Positive for arthralgias.  ?Skin: Negative.   ?Allergic/Immunologic: Negative.   ?Neurological:  Negative for dizziness and syncope.  ?Hematological: Negative.   ?Psychiatric/Behavioral: Negative.    ?  ?BP (!) 157/90   Pulse 60   Resp 20   Ht 5\' 9"  (1.753 m)   Wt 190 lb (86.2 kg)   SpO2 95% Comment: RA  BMI 28.06 kg/m?  ?Physical Exam ?Constitutional:   ?   Appearance: Normal appearance. He is normal weight.  ?HENT:  ?   Head: Normocephalic and atraumatic.  ?   Mouth/Throat:  ?   Mouth: Mucous membranes are moist.  ?   Pharynx: Oropharynx is clear.  ?Eyes:  ?   Extraocular Movements: Extraocular movements intact.  ?   Conjunctiva/sclera: Conjunctivae normal.  ?   Pupils: Pupils are equal, round, and reactive to light.  ?Neck:  ?   Vascular: No carotid bruit.  ?Cardiovascular:  ?   Rate and Rhythm: Normal rate and regular rhythm.  ?   Pulses: Normal pulses.  ?   Heart sounds: Normal heart sounds. No murmur heard. ?Pulmonary:  ?   Effort: Pulmonary effort is normal.  ?   Breath sounds: Normal breath sounds.  ?Abdominal:  ?   General: Abdomen is flat. There is no distension.  ?   Palpations: Abdomen is soft.  ?Musculoskeletal:     ?   General: No swelling. Normal range of motion.  ?   Cervical back: Normal range of motion.  ?Skin: ?   General: Skin is warm and dry.  ?Neurological:  ?   General: No focal deficit present.  ?   Mental Status: He is alert and oriented to person, place, and time.  ?Psychiatric:     ?   Mood and Affect: Mood normal.     ?   Behavior: Behavior normal.  ?  ?  ?  ?Diagnostic Tests: ?  ?  ADDENDUM REPORT: 03/15/2022 08:11 ?  ?EXAM: ?OVER-READ INTERPRETATION  CT CHEST ?  ?The following report is an over-read performed by radiologist Dr. ?Jeronimo GreavesKyle Talbot of [facility] [place] on 03/15/2022. This over-read ?does not include  interpretation of cardiac or coronary anatomy or ?pathology. The coronary CTA interpretation by the cardiologist is ?attached. ?  ?COMPARISON:  None. ?  ?FINDINGS: ?Vascular: Aortic atherosclerosis. No central pulmonary embolism, on ?this non-dedicated study. ?  ?Mediastinum/Nodes: No imaged thoracic adenopathy. ?  ?Lungs/Pleura: No pleural fluid.  Clear imaged lungs. ?  ?Upper Abdomen: Normal imaged portions of the liver, spleen, stomach. ?  ?Musculoskeletal: No acute osseous abnormality. ?  ?IMPRESSION: ?No acute findings in the imaged extracardiac chest. ?  ?Aortic Atherosclerosis (ICD10-I70.0). ?  ?  ?Electronically Signed ?  By: Jeronimo GreavesKyle  Talbot M.D. ?  On: 03/15/2022 08:11 ?   ?Addended by Jeronimo Greavesalbot, Kyle, MD on 03/15/2022  8:13 AM  ?  ?Study Result ?  ?Narrative & Impression  ?CLINICAL DATA:  7M with chest pain ?  ?EXAM: ?Cardiac/Coronary CTA ?  ?TECHNIQUE: ?The patient was scanned on a [employer] scanner. ?  ?FINDINGS: ?A 100 kV prospective scan was triggered in the descending thoracic ?aorta at 111 HU's. Axial non-contrast 3 mm slices were carried out ?through the heart. The data set was analyzed on a dedicated work ?station and scored using the Agatson method. Gantry rotation speed ?was 250 msecs and collimation was .6 mm. 0.8 mg of sl NTG was given. ?The 3D data set was reconstructed in 5% intervals of the 35-75 % of ?the R-R cycle. Phases were analyzed on a dedicated work station ?using MPR, MIP and VRT modes. The patient received 80 cc of ?contrast. ?  ?Coronary Arteries:  Normal coronary origin.  Right dominance. ?  ?RCA is a large dominant artery that gives rise to PDA and PLA. Mixed ?plaque in the proximal RCA causes 70-99% stenosis. Mixed plaque in ?the mid RCA causes 50-69% stenosis ?  ?Left main is a large artery that gives rise to LAD and LCX arteries. ?Calcified plaque in the left main causes 0-24% stenosis ?  ?LAD is a large vessel. Mixed plaque in the proximal LAD causes ?25-49% stenosis. Mixed  plaque in the mid LAD causes 70-99% stenosis. ?High risk plaque features including positive remodeling, low ?attenuation plaque, spotty calcification, and napkin ring sign. ?  ?LCX is a non-dominant artery that gives rise to one large O

## 2022-03-28 ENCOUNTER — Other Ambulatory Visit: Payer: Self-pay

## 2022-03-28 ENCOUNTER — Inpatient Hospital Stay (HOSPITAL_COMMUNITY)
Admission: RE | Disposition: A | Discharge status: Home Health | Payer: Self-pay | Source: Home / Self Care | Attending: Surgery

## 2022-03-28 ENCOUNTER — Inpatient Hospital Stay (HOSPITAL_COMMUNITY): Payer: 59

## 2022-03-28 ENCOUNTER — Inpatient Hospital Stay (HOSPITAL_COMMUNITY): Payer: 59 | Admitting: Certified Registered"

## 2022-03-28 ENCOUNTER — Inpatient Hospital Stay (HOSPITAL_COMMUNITY)
Admission: RE | Admit: 2022-03-28 | Discharge: 2022-04-01 | DRG: 236 | Disposition: A | Discharge status: Home Health | Payer: 59 | Attending: Surgery | Admitting: Surgery

## 2022-03-28 ENCOUNTER — Inpatient Hospital Stay (HOSPITAL_COMMUNITY): Payer: 59 | Admitting: Vascular Surgery

## 2022-03-28 DIAGNOSIS — I10 Essential (primary) hypertension: Secondary | ICD-10-CM | POA: Diagnosis present

## 2022-03-28 DIAGNOSIS — N289 Disorder of kidney and ureter, unspecified: Secondary | ICD-10-CM | POA: Diagnosis not present

## 2022-03-28 DIAGNOSIS — Z7982 Long term (current) use of aspirin: Secondary | ICD-10-CM

## 2022-03-28 DIAGNOSIS — I2510 Atherosclerotic heart disease of native coronary artery without angina pectoris: Secondary | ICD-10-CM

## 2022-03-28 DIAGNOSIS — Z888 Allergy status to other drugs, medicaments and biological substances status: Secondary | ICD-10-CM | POA: Diagnosis not present

## 2022-03-28 DIAGNOSIS — Z8249 Family history of ischemic heart disease and other diseases of the circulatory system: Secondary | ICD-10-CM | POA: Diagnosis not present

## 2022-03-28 DIAGNOSIS — I25110 Atherosclerotic heart disease of native coronary artery with unstable angina pectoris: Principal | ICD-10-CM | POA: Diagnosis present

## 2022-03-28 DIAGNOSIS — I25119 Atherosclerotic heart disease of native coronary artery with unspecified angina pectoris: Secondary | ICD-10-CM | POA: Diagnosis not present

## 2022-03-28 DIAGNOSIS — Z79899 Other long term (current) drug therapy: Secondary | ICD-10-CM | POA: Diagnosis not present

## 2022-03-28 DIAGNOSIS — D62 Acute posthemorrhagic anemia: Secondary | ICD-10-CM | POA: Diagnosis not present

## 2022-03-28 DIAGNOSIS — F17210 Nicotine dependence, cigarettes, uncomplicated: Secondary | ICD-10-CM | POA: Diagnosis present

## 2022-03-28 DIAGNOSIS — Z951 Presence of aortocoronary bypass graft: Principal | ICD-10-CM

## 2022-03-28 HISTORY — PX: TEE WITHOUT CARDIOVERSION: SHX5443

## 2022-03-28 HISTORY — PX: CORONARY ARTERY BYPASS GRAFT: SHX141

## 2022-03-28 LAB — POCT I-STAT 7, (LYTES, BLD GAS, ICA,H+H)
Acid-Base Excess: 3 mmol/L — ABNORMAL HIGH (ref 0.0–2.0)
Acid-Base Excess: 1 mmol/L (ref 0.0–2.0)
Acid-Base Excess: 3 mmol/L — ABNORMAL HIGH (ref 0.0–2.0)
Acid-base deficit: 2 mmol/L (ref 0.0–2.0)
Acid-base deficit: 2 mmol/L (ref 0.0–2.0)
Acid-base deficit: 2 mmol/L (ref 0.0–2.0)
Acid-base deficit: 1 mmol/L (ref 0.0–2.0)
Bicarbonate: 29.8 mmol/L — ABNORMAL HIGH (ref 20.0–28.0)
Bicarbonate: 24.2 mmol/L (ref 20.0–28.0)
Bicarbonate: 25.8 mmol/L (ref 20.0–28.0)
Bicarbonate: 27.1 mmol/L (ref 20.0–28.0)
Bicarbonate: 23.6 mmol/L (ref 20.0–28.0)
Bicarbonate: 23.4 mmol/L (ref 20.0–28.0)
Bicarbonate: 24.3 mmol/L (ref 20.0–28.0)
Calcium, Ion: 1.27 mmol/L (ref 1.15–1.40)
Calcium, Ion: 1.03 mmol/L — ABNORMAL LOW (ref 1.15–1.40)
Calcium, Ion: 1.09 mmol/L — ABNORMAL LOW (ref 1.15–1.40)
Calcium, Ion: 1.05 mmol/L — ABNORMAL LOW (ref 1.15–1.40)
Calcium, Ion: 1.21 mmol/L (ref 1.15–1.40)
Calcium, Ion: 1.21 mmol/L (ref 1.15–1.40)
Calcium, Ion: 1.23 mmol/L (ref 1.15–1.40)
HCT: 38 % — ABNORMAL LOW (ref 39.0–52.0)
HCT: 27 % — ABNORMAL LOW (ref 39.0–52.0)
HCT: 32 % — ABNORMAL LOW (ref 39.0–52.0)
HCT: 29 % — ABNORMAL LOW (ref 39.0–52.0)
HCT: 30 % — ABNORMAL LOW (ref 39.0–52.0)
HCT: 27 % — ABNORMAL LOW (ref 39.0–52.0)
HCT: 29 % — ABNORMAL LOW (ref 39.0–52.0)
Hemoglobin: 12.9 g/dL — ABNORMAL LOW (ref 13.0–17.0)
Hemoglobin: 10.9 g/dL — ABNORMAL LOW (ref 13.0–17.0)
Hemoglobin: 9.2 g/dL — ABNORMAL LOW (ref 13.0–17.0)
Hemoglobin: 9.9 g/dL — ABNORMAL LOW (ref 13.0–17.0)
Hemoglobin: 10.2 g/dL — ABNORMAL LOW (ref 13.0–17.0)
Hemoglobin: 9.2 g/dL — ABNORMAL LOW (ref 13.0–17.0)
Hemoglobin: 9.9 g/dL — ABNORMAL LOW (ref 13.0–17.0)
O2 Saturation: 100 %
O2 Saturation: 100 %
O2 Saturation: 78 %
O2 Saturation: 100 %
O2 Saturation: 99 %
O2 Saturation: 96 %
O2 Saturation: 97 %
Patient temperature: 35
Patient temperature: 37.1
Patient temperature: 37.3
Potassium: 3.6 mmol/L (ref 3.5–5.1)
Potassium: 3.9 mmol/L (ref 3.5–5.1)
Potassium: 4.6 mmol/L (ref 3.5–5.1)
Potassium: 4.2 mmol/L (ref 3.5–5.1)
Potassium: 3.3 mmol/L — ABNORMAL LOW (ref 3.5–5.1)
Potassium: 3.8 mmol/L (ref 3.5–5.1)
Potassium: 4.2 mmol/L (ref 3.5–5.1)
Sodium: 135 mmol/L (ref 135–145)
Sodium: 136 mmol/L (ref 135–145)
Sodium: 137 mmol/L (ref 135–145)
Sodium: 135 mmol/L (ref 135–145)
Sodium: 142 mmol/L (ref 135–145)
Sodium: 142 mmol/L (ref 135–145)
Sodium: 140 mmol/L (ref 135–145)
TCO2: 31 mmol/L (ref 22–32)
TCO2: 25 mmol/L (ref 22–32)
TCO2: 27 mmol/L (ref 22–32)
TCO2: 28 mmol/L (ref 22–32)
TCO2: 25 mmol/L (ref 22–32)
TCO2: 25 mmol/L (ref 22–32)
TCO2: 26 mmol/L (ref 22–32)
pCO2 arterial: 51.7 mmHg — ABNORMAL HIGH (ref 32–48)
pCO2 arterial: 39.9 mmHg (ref 32–48)
pCO2 arterial: 44.5 mmHg (ref 32–48)
pCO2 arterial: 36.4 mmHg (ref 32–48)
pCO2 arterial: 38.8 mmHg (ref 32–48)
pCO2 arterial: 42.9 mmHg (ref 32–48)
pCO2 arterial: 43.2 mmHg (ref 32–48)
pH, Arterial: 7.369 (ref 7.35–7.45)
pH, Arterial: 7.343 — ABNORMAL LOW (ref 7.35–7.45)
pH, Arterial: 7.417 (ref 7.35–7.45)
pH, Arterial: 7.48 — ABNORMAL HIGH (ref 7.35–7.45)
pH, Arterial: 7.383 (ref 7.35–7.45)
pH, Arterial: 7.344 — ABNORMAL LOW (ref 7.35–7.45)
pH, Arterial: 7.36 (ref 7.35–7.45)
pO2, Arterial: 317 mmHg — ABNORMAL HIGH (ref 83–108)
pO2, Arterial: 327 mmHg — ABNORMAL HIGH (ref 83–108)
pO2, Arterial: 45 mmHg — ABNORMAL LOW (ref 83–108)
pO2, Arterial: 349 mmHg — ABNORMAL HIGH (ref 83–108)
pO2, Arterial: 112 mmHg — ABNORMAL HIGH (ref 83–108)
pO2, Arterial: 87 mmHg (ref 83–108)
pO2, Arterial: 97 mmHg (ref 83–108)

## 2022-03-28 LAB — BASIC METABOLIC PANEL
Anion gap: 6 (ref 5–15)
BUN: 8 mg/dL (ref 8–23)
CO2: 25 mmol/L (ref 22–32)
Calcium: 8.2 mg/dL — ABNORMAL LOW (ref 8.9–10.3)
Chloride: 107 mmol/L (ref 98–111)
Creatinine, Ser: 1.23 mg/dL (ref 0.61–1.24)
GFR, Estimated: >60 mL/min (ref >60)
Glucose, Bld: 174 mg/dL — ABNORMAL HIGH (ref 70–99)
Potassium: 4.7 mmol/L (ref 3.5–5.1)
Sodium: 138 mmol/L (ref 135–145)

## 2022-03-28 LAB — CBC
HCT: 30.2 % — ABNORMAL LOW (ref 39.0–52.0)
HCT: 29.2 % — ABNORMAL LOW (ref 39.0–52.0)
Hemoglobin: 10.2 g/dL — ABNORMAL LOW (ref 13.0–17.0)
Hemoglobin: 9.7 g/dL — ABNORMAL LOW (ref 13.0–17.0)
MCH: 28.9 pg (ref 26.0–34.0)
MCH: 28.3 pg (ref 26.0–34.0)
MCHC: 33.8 g/dL (ref 30.0–36.0)
MCHC: 33.2 g/dL (ref 30.0–36.0)
MCV: 85.6 fL (ref 80.0–100.0)
MCV: 85.1 fL (ref 80.0–100.0)
Platelets: 156 10*3/uL (ref 150–400)
Platelets: 182 10*3/uL (ref 150–400)
RBC: 3.53 MIL/uL — ABNORMAL LOW (ref 4.22–5.81)
RBC: 3.43 MIL/uL — ABNORMAL LOW (ref 4.22–5.81)
RDW: 14 % (ref 11.5–15.5)
RDW: 14.3 % (ref 11.5–15.5)
WBC: 9.5 10*3/uL (ref 4.0–10.5)
WBC: 9.2 10*3/uL (ref 4.0–10.5)
nRBC: 0 % (ref 0.0–0.2)
nRBC: 0 % (ref 0.0–0.2)

## 2022-03-28 LAB — GLUCOSE, CAPILLARY
Glucose-Capillary: 90 mg/dL (ref 70–99)
Glucose-Capillary: 95 mg/dL (ref 70–99)
Glucose-Capillary: 112 mg/dL — ABNORMAL HIGH (ref 70–99)
Glucose-Capillary: 68 mg/dL — ABNORMAL LOW (ref 70–99)
Glucose-Capillary: 179 mg/dL — ABNORMAL HIGH (ref 70–99)
Glucose-Capillary: 144 mg/dL — ABNORMAL HIGH (ref 70–99)
Glucose-Capillary: 117 mg/dL — ABNORMAL HIGH (ref 70–99)
Glucose-Capillary: 130 mg/dL — ABNORMAL HIGH (ref 70–99)
Glucose-Capillary: 190 mg/dL — ABNORMAL HIGH (ref 70–99)
Glucose-Capillary: 116 mg/dL — ABNORMAL HIGH (ref 70–99)
Glucose-Capillary: 143 mg/dL — ABNORMAL HIGH (ref 70–99)

## 2022-03-28 LAB — POCT I-STAT, CHEM 8
BUN: 15 mg/dL (ref 8–23)
BUN: 14 mg/dL (ref 8–23)
BUN: 12 mg/dL (ref 8–23)
BUN: 13 mg/dL (ref 8–23)
BUN: 13 mg/dL (ref 8–23)
Calcium, Ion: 1.28 mmol/L (ref 1.15–1.40)
Calcium, Ion: 1.22 mmol/L (ref 1.15–1.40)
Calcium, Ion: 1.02 mmol/L — ABNORMAL LOW (ref 1.15–1.40)
Calcium, Ion: 1.04 mmol/L — ABNORMAL LOW (ref 1.15–1.40)
Calcium, Ion: 1.24 mmol/L (ref 1.15–1.40)
Chloride: 99 mmol/L (ref 98–111)
Chloride: 102 mmol/L (ref 98–111)
Chloride: 101 mmol/L (ref 98–111)
Chloride: 101 mmol/L (ref 98–111)
Chloride: 101 mmol/L (ref 98–111)
Creatinine, Ser: 1.2 mg/dL (ref 0.61–1.24)
Creatinine, Ser: 1.2 mg/dL (ref 0.61–1.24)
Creatinine, Ser: 1 mg/dL (ref 0.61–1.24)
Creatinine, Ser: 1 mg/dL (ref 0.61–1.24)
Creatinine, Ser: 1.1 mg/dL (ref 0.61–1.24)
Glucose, Bld: 169 mg/dL — ABNORMAL HIGH (ref 70–99)
Glucose, Bld: 89 mg/dL (ref 70–99)
Glucose, Bld: 107 mg/dL — ABNORMAL HIGH (ref 70–99)
Glucose, Bld: 153 mg/dL — ABNORMAL HIGH (ref 70–99)
Glucose, Bld: 155 mg/dL — ABNORMAL HIGH (ref 70–99)
HCT: 38 % — ABNORMAL LOW (ref 39.0–52.0)
HCT: 38 % — ABNORMAL LOW (ref 39.0–52.0)
HCT: 27 % — ABNORMAL LOW (ref 39.0–52.0)
HCT: 32 % — ABNORMAL LOW (ref 39.0–52.0)
HCT: 31 % — ABNORMAL LOW (ref 39.0–52.0)
Hemoglobin: 12.9 g/dL — ABNORMAL LOW (ref 13.0–17.0)
Hemoglobin: 12.9 g/dL — ABNORMAL LOW (ref 13.0–17.0)
Hemoglobin: 10.9 g/dL — ABNORMAL LOW (ref 13.0–17.0)
Hemoglobin: 9.2 g/dL — ABNORMAL LOW (ref 13.0–17.0)
Hemoglobin: 10.5 g/dL — ABNORMAL LOW (ref 13.0–17.0)
Potassium: 3.6 mmol/L (ref 3.5–5.1)
Potassium: 3.5 mmol/L (ref 3.5–5.1)
Potassium: 4.5 mmol/L (ref 3.5–5.1)
Potassium: 4.7 mmol/L (ref 3.5–5.1)
Potassium: 3.8 mmol/L (ref 3.5–5.1)
Sodium: 136 mmol/L (ref 135–145)
Sodium: 137 mmol/L (ref 135–145)
Sodium: 135 mmol/L (ref 135–145)
Sodium: 136 mmol/L (ref 135–145)
Sodium: 137 mmol/L (ref 135–145)
TCO2: 27 mmol/L (ref 22–32)
TCO2: 27 mmol/L (ref 22–32)
TCO2: 25 mmol/L (ref 22–32)
TCO2: 28 mmol/L (ref 22–32)
TCO2: 24 mmol/L (ref 22–32)

## 2022-03-28 LAB — APTT: aPTT: 36 seconds (ref 24–36)

## 2022-03-28 LAB — ABO/RH: ABO/RH(D): O POS

## 2022-03-28 LAB — PROTIME-INR
INR: 1.5 — ABNORMAL HIGH (ref 0.8–1.2)
Prothrombin Time: 17.7 seconds — ABNORMAL HIGH (ref 11.4–15.2)

## 2022-03-28 LAB — HEMOGLOBIN AND HEMATOCRIT, BLOOD
HCT: 32.1 % — ABNORMAL LOW (ref 39.0–52.0)
Hemoglobin: 11 g/dL — ABNORMAL LOW (ref 13.0–17.0)

## 2022-03-28 LAB — PLATELET COUNT: Platelets: 236 10*3/uL (ref 150–400)

## 2022-03-28 SURGERY — CORONARY ARTERY BYPASS GRAFTING (CABG)
Anesthesia: General | Site: Chest

## 2022-03-28 MED ORDER — PROPOFOL 10 MG/ML IV BOLUS
INTRAVENOUS | Status: DC | PRN
Start: 2022-03-28 — End: 2022-03-28
  Administered 2022-03-28 (×2): 20 mg via INTRAVENOUS
  Administered 2022-03-28: 30 mg via INTRAVENOUS

## 2022-03-28 MED ORDER — SODIUM CHLORIDE (PF) 0.9 % IJ SOLN
INTRAMUSCULAR | Status: AC
  Filled 2022-03-28: qty 10

## 2022-03-28 MED ORDER — ASPIRIN 81 MG PO CHEW
324.0000 mg | CHEWABLE_TABLET | Freq: Every day | ORAL | Status: DC
  Filled 2022-03-28: qty 4

## 2022-03-28 MED ORDER — HEPARIN SODIUM (PORCINE) 1000 UNIT/ML IJ SOLN
INTRAMUSCULAR | Status: AC
  Filled 2022-03-28: qty 1

## 2022-03-28 MED ORDER — ONDANSETRON HCL 4 MG/2ML IJ SOLN
4.0000 mg | Freq: Four times a day (QID) | INTRAMUSCULAR | Status: DC | PRN
  Administered 2022-03-28: 4 mg via INTRAVENOUS
  Filled 2022-03-28: qty 2

## 2022-03-28 MED ORDER — ORAL CARE MOUTH RINSE
15.0000 mL | OROMUCOSAL | Status: DC
  Administered 2022-03-28: 15 mL via OROMUCOSAL

## 2022-03-28 MED ORDER — LACTATED RINGERS IV SOLN
INTRAVENOUS | Status: DC | PRN
Start: 2022-03-28 — End: 2022-03-28

## 2022-03-28 MED ORDER — CEFAZOLIN SODIUM-DEXTROSE 2-4 GM/100ML-% IV SOLN
2.0000 g | Freq: Three times a day (TID) | INTRAVENOUS | Status: AC
  Administered 2022-03-28 – 2022-03-30 (×6): 2 g via INTRAVENOUS
  Filled 2022-03-28 (×6): qty 100

## 2022-03-28 MED ORDER — ALBUMIN HUMAN 5 % IV SOLN
250.0000 mL | INTRAVENOUS | Status: AC | PRN
  Administered 2022-03-28 (×4): 12.5 g via INTRAVENOUS
  Filled 2022-03-28: qty 250

## 2022-03-28 MED ORDER — BISACODYL 5 MG PO TBEC
10.0000 mg | DELAYED_RELEASE_TABLET | Freq: Every day | ORAL | Status: DC
  Administered 2022-03-29 – 2022-03-30 (×2): 10 mg via ORAL
  Filled 2022-03-28 (×2): qty 2

## 2022-03-28 MED ORDER — DEXMEDETOMIDINE HCL IN NACL 400 MCG/100ML IV SOLN
0.0000 ug/kg/h | INTRAVENOUS | Status: DC
  Administered 2022-03-28: 0.3 ug/kg/h via INTRAVENOUS
  Filled 2022-03-28: qty 100

## 2022-03-28 MED ORDER — PANTOPRAZOLE SODIUM 40 MG PO TBEC
40.0000 mg | DELAYED_RELEASE_TABLET | Freq: Every day | ORAL | Status: DC
  Administered 2022-03-30 – 2022-04-01 (×3): 40 mg via ORAL
  Filled 2022-03-28 (×3): qty 1

## 2022-03-28 MED ORDER — FAMOTIDINE IN NACL 20-0.9 MG/50ML-% IV SOLN
20.0000 mg | Freq: Two times a day (BID) | INTRAVENOUS | Status: AC
  Administered 2022-03-28 (×2): 20 mg via INTRAVENOUS
  Filled 2022-03-28 (×2): qty 50

## 2022-03-28 MED ORDER — PLASMA-LYTE A IV SOLN: INTRAVENOUS | Status: DC | PRN

## 2022-03-28 MED ORDER — HEPARIN SODIUM (PORCINE) 1000 UNIT/ML IJ SOLN
INTRAMUSCULAR | Status: DC | PRN
  Administered 2022-03-28: 29000 [IU] via INTRAVENOUS

## 2022-03-28 MED ORDER — INSULIN REGULAR(HUMAN) IN NACL 100-0.9 UT/100ML-% IV SOLN: INTRAVENOUS | Status: DC

## 2022-03-28 MED ORDER — MAGNESIUM SULFATE 4 GM/100ML IV SOLN
4.0000 g | Freq: Once | INTRAVENOUS | Status: AC
  Administered 2022-03-28: 4 g via INTRAVENOUS
  Filled 2022-03-28: qty 100

## 2022-03-28 MED ORDER — FENTANYL CITRATE (PF) 250 MCG/5ML IJ SOLN
INTRAMUSCULAR | Status: AC
  Filled 2022-03-28: qty 5

## 2022-03-28 MED ORDER — LACTATED RINGERS IV SOLN: INTRAVENOUS | Status: DC

## 2022-03-28 MED ORDER — LACTATED RINGERS IV SOLN
500.0000 mL | Freq: Once | INTRAVENOUS | Status: AC | PRN
  Administered 2022-03-28: 1000 mL via INTRAVENOUS

## 2022-03-28 MED ORDER — ASPIRIN EC 325 MG PO TBEC
325.0000 mg | DELAYED_RELEASE_TABLET | Freq: Every day | ORAL | Status: DC
  Administered 2022-03-29 – 2022-03-30 (×2): 325 mg via ORAL
  Filled 2022-03-28 (×2): qty 1

## 2022-03-28 MED ORDER — OXYCODONE HCL 5 MG PO TABS
5.0000 mg | ORAL_TABLET | ORAL | Status: DC | PRN
  Administered 2022-03-28 (×2): 10 mg via ORAL
  Filled 2022-03-28 (×2): qty 2

## 2022-03-28 MED ORDER — CHLORHEXIDINE GLUCONATE 0.12 % MT SOLN
OROMUCOSAL | Status: AC
Start: 2022-03-28 — End: 2022-03-28
  Administered 2022-03-28: 15 mL via OROMUCOSAL
  Filled 2022-03-28: qty 15

## 2022-03-28 MED ORDER — MORPHINE SULFATE (PF) 2 MG/ML IV SOLN
1.0000 mg | INTRAVENOUS | Status: DC | PRN
  Administered 2022-03-28: 2 mg via INTRAVENOUS
  Filled 2022-03-28: qty 1

## 2022-03-28 MED ORDER — CHLORHEXIDINE GLUCONATE 4 % EX LIQD: 30.0000 mL | CUTANEOUS | Status: DC

## 2022-03-28 MED ORDER — LACTATED RINGERS IV SOLN: INTRAVENOUS | Status: DC | PRN

## 2022-03-28 MED ORDER — SODIUM CHLORIDE 0.9 % IV SOLN: INTRAVENOUS | Status: DC

## 2022-03-28 MED ORDER — SODIUM CHLORIDE 0.9% FLUSH
3.0000 mL | Freq: Two times a day (BID) | INTRAVENOUS | Status: DC
  Administered 2022-03-29 (×2): 3 mL via INTRAVENOUS

## 2022-03-28 MED ORDER — SODIUM CHLORIDE 0.9% FLUSH: 10.0000 mL | INTRAVENOUS | Status: DC | PRN

## 2022-03-28 MED ORDER — CHLORHEXIDINE GLUCONATE 0.12 % MT SOLN: 15.0000 mL | Freq: Once | OROMUCOSAL | Status: DC

## 2022-03-28 MED ORDER — BISACODYL 10 MG RE SUPP: 10.0000 mg | Freq: Every day | RECTAL | Status: DC

## 2022-03-28 MED ORDER — ROCURONIUM BROMIDE 10 MG/ML (PF) SYRINGE
PREFILLED_SYRINGE | INTRAVENOUS | Status: DC | PRN
  Administered 2022-03-28: 50 mg via INTRAVENOUS
  Administered 2022-03-28: 20 mg via INTRAVENOUS
  Administered 2022-03-28: 80 mg via INTRAVENOUS
  Administered 2022-03-28: 50 mg via INTRAVENOUS

## 2022-03-28 MED ORDER — CHLORHEXIDINE GLUCONATE 0.12 % MT SOLN
15.0000 mL | OROMUCOSAL | Status: AC
  Administered 2022-03-28: 15 mL via OROMUCOSAL

## 2022-03-28 MED ORDER — METOPROLOL TARTRATE 25 MG/10 ML ORAL SUSPENSION: 12.5000 mg | Freq: Two times a day (BID) | ORAL | Status: DC

## 2022-03-28 MED ORDER — METOPROLOL TARTRATE 12.5 MG HALF TABLET: 12.5000 mg | ORAL_TABLET | Freq: Two times a day (BID) | ORAL | Status: DC

## 2022-03-28 MED ORDER — SODIUM CHLORIDE 0.9% FLUSH: 3.0000 mL | INTRAVENOUS | Status: DC | PRN

## 2022-03-28 MED ORDER — SODIUM CHLORIDE 0.45 % IV SOLN: INTRAVENOUS | Status: DC | PRN

## 2022-03-28 MED ORDER — SODIUM CHLORIDE 0.9% FLUSH
10.0000 mL | Freq: Two times a day (BID) | INTRAVENOUS | Status: DC
  Administered 2022-03-28 – 2022-03-29 (×4): 10 mL

## 2022-03-28 MED ORDER — 0.9 % SODIUM CHLORIDE (POUR BTL) OPTIME
TOPICAL | Status: DC | PRN
  Administered 2022-03-28: 5000 mL

## 2022-03-28 MED ORDER — ROCURONIUM BROMIDE 10 MG/ML (PF) SYRINGE
PREFILLED_SYRINGE | INTRAVENOUS | Status: AC
  Filled 2022-03-28: qty 10

## 2022-03-28 MED ORDER — THROMBIN (RECOMBINANT) 20000 UNITS EX SOLR
CUTANEOUS | Status: AC
  Filled 2022-03-28: qty 20000

## 2022-03-28 MED ORDER — LIDOCAINE 2% (20 MG/ML) 5 ML SYRINGE
INTRAMUSCULAR | Status: DC | PRN
  Administered 2022-03-28: 60 mg via INTRAVENOUS

## 2022-03-28 MED ORDER — NITROGLYCERIN IN D5W 200-5 MCG/ML-% IV SOLN: 0.0000 ug/min | INTRAVENOUS | Status: DC

## 2022-03-28 MED ORDER — ACETAMINOPHEN 160 MG/5ML PO SOLN: 650.0000 mg | Freq: Once | ORAL | Status: AC

## 2022-03-28 MED ORDER — THROMBIN 20000 UNITS EX SOLR: OROMUCOSAL | Status: DC | PRN

## 2022-03-28 MED ORDER — TRAMADOL HCL 50 MG PO TABS
50.0000 mg | ORAL_TABLET | ORAL | Status: DC | PRN
  Administered 2022-03-28 – 2022-03-29 (×5): 100 mg via ORAL
  Administered 2022-03-30: 50 mg via ORAL
  Administered 2022-03-30 – 2022-04-01 (×4): 100 mg via ORAL
  Filled 2022-03-28 (×12): qty 2

## 2022-03-28 MED ORDER — METOPROLOL TARTRATE 5 MG/5ML IV SOLN: 2.5000 mg | INTRAVENOUS | Status: DC | PRN

## 2022-03-28 MED ORDER — POTASSIUM CHLORIDE 10 MEQ/50ML IV SOLN
10.0000 meq | INTRAVENOUS | Status: AC
  Administered 2022-03-28 (×4): 10 meq via INTRAVENOUS
  Filled 2022-03-28: qty 50

## 2022-03-28 MED ORDER — HEMOSTATIC AGENTS (NO CHARGE) OPTIME
TOPICAL | Status: DC | PRN
  Administered 2022-03-28: 1 via TOPICAL

## 2022-03-28 MED ORDER — ALBUMIN HUMAN 5 % IV SOLN: INTRAVENOUS | Status: DC | PRN

## 2022-03-28 MED ORDER — EPHEDRINE SULFATE (PRESSORS) 50 MG/ML IJ SOLN
INTRAMUSCULAR | Status: DC | PRN
  Administered 2022-03-28: 5 mg via INTRAVENOUS

## 2022-03-28 MED ORDER — MIDAZOLAM HCL (PF) 5 MG/ML IJ SOLN
INTRAMUSCULAR | Status: DC | PRN
  Administered 2022-03-28 (×5): 2 mg via INTRAVENOUS

## 2022-03-28 MED ORDER — ORAL CARE MOUTH RINSE
15.0000 mL | Freq: Two times a day (BID) | OROMUCOSAL | Status: DC
  Administered 2022-03-28 – 2022-03-29 (×3): 15 mL via OROMUCOSAL

## 2022-03-28 MED ORDER — VANCOMYCIN HCL IN DEXTROSE 1-5 GM/200ML-% IV SOLN
1000.0000 mg | Freq: Once | INTRAVENOUS | Status: AC
  Administered 2022-03-28: 1000 mg via INTRAVENOUS
  Filled 2022-03-28: qty 200

## 2022-03-28 MED ORDER — PROPOFOL 10 MG/ML IV BOLUS
INTRAVENOUS | Status: AC
  Filled 2022-03-28: qty 20

## 2022-03-28 MED ORDER — ACETAMINOPHEN 160 MG/5ML PO SOLN: 1000.0000 mg | Freq: Four times a day (QID) | ORAL | Status: DC

## 2022-03-28 MED ORDER — LIDOCAINE 2% (20 MG/ML) 5 ML SYRINGE
INTRAMUSCULAR | Status: AC
  Filled 2022-03-28: qty 5

## 2022-03-28 MED ORDER — DOCUSATE SODIUM 100 MG PO CAPS
200.0000 mg | ORAL_CAPSULE | Freq: Every day | ORAL | Status: DC
  Administered 2022-03-29 – 2022-03-30 (×2): 200 mg via ORAL
  Filled 2022-03-28 (×2): qty 2

## 2022-03-28 MED ORDER — SODIUM CHLORIDE 0.9 % IV SOLN: 250.0000 mL | INTRAVENOUS | Status: DC

## 2022-03-28 MED ORDER — POTASSIUM CHLORIDE 10 MEQ/50ML IV SOLN: 10.0000 meq | INTRAVENOUS | Status: DC

## 2022-03-28 MED ORDER — METOCLOPRAMIDE HCL 5 MG/ML IJ SOLN
10.0000 mg | Freq: Four times a day (QID) | INTRAMUSCULAR | Status: AC
  Administered 2022-03-28 – 2022-03-29 (×4): 10 mg via INTRAVENOUS
  Filled 2022-03-28 (×4): qty 2

## 2022-03-28 MED ORDER — MIDAZOLAM HCL 2 MG/2ML IJ SOLN: 2.0000 mg | INTRAMUSCULAR | Status: DC | PRN

## 2022-03-28 MED ORDER — PHENYLEPHRINE HCL-NACL 20-0.9 MG/250ML-% IV SOLN: 0.0000 ug/min | INTRAVENOUS | Status: DC

## 2022-03-28 MED ORDER — ACETAMINOPHEN 650 MG RE SUPP
650.0000 mg | Freq: Once | RECTAL | Status: AC
  Administered 2022-03-28: 650 mg via RECTAL

## 2022-03-28 MED ORDER — CHLORHEXIDINE GLUCONATE CLOTH 2 % EX PADS
6.0000 | MEDICATED_PAD | Freq: Every day | CUTANEOUS | Status: DC
  Administered 2022-03-28 – 2022-03-29 (×2): 6 via TOPICAL

## 2022-03-28 MED ORDER — METOPROLOL TARTRATE 12.5 MG HALF TABLET
12.5000 mg | ORAL_TABLET | Freq: Once | ORAL | Status: DC
  Filled 2022-03-28: qty 1

## 2022-03-28 MED ORDER — ~~LOC~~ CARDIAC SURGERY, PATIENT & FAMILY EDUCATION
Freq: Once | Status: DC
  Filled 2022-03-28: qty 1

## 2022-03-28 MED ORDER — FENTANYL CITRATE (PF) 250 MCG/5ML IJ SOLN
INTRAMUSCULAR | Status: DC | PRN
  Administered 2022-03-28 (×2): 150 ug via INTRAVENOUS
  Administered 2022-03-28: 50 ug via INTRAVENOUS
  Administered 2022-03-28 (×2): 100 ug via INTRAVENOUS
  Administered 2022-03-28: 50 ug via INTRAVENOUS
  Administered 2022-03-28 (×4): 100 ug via INTRAVENOUS

## 2022-03-28 MED ORDER — DEXTROSE 50 % IV SOLN
0.0000 mL | INTRAVENOUS | Status: DC | PRN
  Administered 2022-03-28: 35 mL via INTRAVENOUS
  Filled 2022-03-28: qty 50

## 2022-03-28 MED ORDER — CHLORHEXIDINE GLUCONATE 0.12% ORAL RINSE (MEDLINE KIT): 15.0000 mL | Freq: Two times a day (BID) | OROMUCOSAL | Status: DC

## 2022-03-28 MED ORDER — ACETAMINOPHEN 500 MG PO TABS
1000.0000 mg | ORAL_TABLET | Freq: Four times a day (QID) | ORAL | Status: DC
  Administered 2022-03-28 – 2022-04-01 (×13): 1000 mg via ORAL
  Filled 2022-03-28 (×14): qty 2

## 2022-03-28 MED ORDER — PHENYLEPHRINE 80 MCG/ML (10ML) SYRINGE FOR IV PUSH (FOR BLOOD PRESSURE SUPPORT)
PREFILLED_SYRINGE | INTRAVENOUS | Status: AC
  Filled 2022-03-28: qty 10

## 2022-03-28 MED ORDER — PROTAMINE SULFATE 10 MG/ML IV SOLN
INTRAVENOUS | Status: DC | PRN
  Administered 2022-03-28: 290 mg via INTRAVENOUS

## 2022-03-28 MED ORDER — PHENYLEPHRINE 80 MCG/ML (10ML) SYRINGE FOR IV PUSH (FOR BLOOD PRESSURE SUPPORT)
PREFILLED_SYRINGE | INTRAVENOUS | Status: DC | PRN
  Administered 2022-03-28 (×6): 80 ug via INTRAVENOUS

## 2022-03-28 MED ORDER — MIDAZOLAM HCL (PF) 10 MG/2ML IJ SOLN
INTRAMUSCULAR | Status: AC
  Filled 2022-03-28: qty 2

## 2022-03-28 SURGICAL SUPPLY — 111 items
ADH SKN CLS APL DERMABOND .7 (GAUZE/BANDAGES/DRESSINGS) ×8
BAG DECANTER FOR FLEXI CONT (MISCELLANEOUS) ×4 IMPLANT
BLADE CLIPPER SURG (BLADE) ×5 IMPLANT
BLADE STERNUM SYSTEM 6 (BLADE) ×4 IMPLANT
BLADE SURG 11 STRL SS (BLADE) ×1 IMPLANT
BNDG ELASTIC 4X5.8 VLCR STR LF (GAUZE/BANDAGES/DRESSINGS) ×5 IMPLANT
BNDG ELASTIC 6X5.8 VLCR STR LF (GAUZE/BANDAGES/DRESSINGS) ×5 IMPLANT
BNDG GAUZE ELAST 4 BULKY (GAUZE/BANDAGES/DRESSINGS) ×5 IMPLANT
CANISTER SUCT 3000ML PPV (MISCELLANEOUS) ×4 IMPLANT
CATH ROBINSON RED A/P 18FR (CATHETERS) ×9 IMPLANT
CATH THORACIC 28FR (CATHETERS) ×4 IMPLANT
CATH THORACIC 36FR (CATHETERS) ×4 IMPLANT
CATH THORACIC 36FR RT ANG (CATHETERS) ×4 IMPLANT
CLIP TI WIDE RED SMALL 24 (CLIP) ×1 IMPLANT
CLIP VESOCCLUDE MED 24/CT (CLIP) IMPLANT
CLIP VESOCCLUDE SM WIDE 24/CT (CLIP) IMPLANT
CONTAINER PROTECT SURGISLUSH (MISCELLANEOUS) ×8 IMPLANT
DEFOGGER ANTIFOG KIT (MISCELLANEOUS) ×1 IMPLANT
DERMABOND ADVANCED (GAUZE/BANDAGES/DRESSINGS) ×4
DERMABOND ADVANCED .7 DNX12 (GAUZE/BANDAGES/DRESSINGS) IMPLANT
DRAPE CARDIOVASCULAR INCISE (DRAPES) ×3
DRAPE SRG 135X102X78XABS (DRAPES) ×3 IMPLANT
DRAPE WARM FLUID 44X44 (DRAPES) ×4 IMPLANT
DRSG COVADERM 4X14 (GAUZE/BANDAGES/DRESSINGS) ×4 IMPLANT
ELECT BLADE 4.0 EZ CLEAN MEGAD (MISCELLANEOUS) ×3
ELECT CAUTERY BLADE 6.4 (BLADE) ×4 IMPLANT
ELECT REM PT RETURN 9FT ADLT (ELECTROSURGICAL) ×6
ELECTRODE BLDE 4.0 EZ CLN MEGD (MISCELLANEOUS) IMPLANT
ELECTRODE REM PT RTRN 9FT ADLT (ELECTROSURGICAL) ×6 IMPLANT
FELT TEFLON 1X6 (MISCELLANEOUS) ×7 IMPLANT
GAUZE 4X4 16PLY ~~LOC~~+RFID DBL (SPONGE) ×3 IMPLANT
GAUZE SPONGE 4X4 12PLY STRL (GAUZE/BANDAGES/DRESSINGS) ×9 IMPLANT
GLOVE BIO SURGEON STRL SZ 6 (GLOVE) IMPLANT
GLOVE BIO SURGEON STRL SZ 6.5 (GLOVE) IMPLANT
GLOVE BIO SURGEON STRL SZ7 (GLOVE) IMPLANT
GLOVE BIO SURGEON STRL SZ7.5 (GLOVE) IMPLANT
GLOVE BIOGEL PI IND STRL 6 (GLOVE) IMPLANT
GLOVE BIOGEL PI IND STRL 6.5 (GLOVE) IMPLANT
GLOVE BIOGEL PI IND STRL 7.0 (GLOVE) IMPLANT
GLOVE BIOGEL PI INDICATOR 6 (GLOVE)
GLOVE BIOGEL PI INDICATOR 6.5 (GLOVE)
GLOVE BIOGEL PI INDICATOR 7.0 (GLOVE)
GLOVE ORTHO TXT STRL SZ7.5 (GLOVE) IMPLANT
GLOVE SS BIOGEL STRL SZ 6 (GLOVE) IMPLANT
GLOVE SS BIOGEL STRL SZ 6.5 (GLOVE) IMPLANT
GLOVE SUPERSENSE BIOGEL SZ 6 (GLOVE) ×2
GLOVE SUPERSENSE BIOGEL SZ 6.5 (GLOVE) ×1
GLOVE SURG MICRO LTX SZ7 (GLOVE) ×8 IMPLANT
GOWN STRL REUS W/ TWL LRG LVL3 (GOWN DISPOSABLE) ×12 IMPLANT
GOWN STRL REUS W/ TWL XL LVL3 (GOWN DISPOSABLE) ×3 IMPLANT
GOWN STRL REUS W/TWL LRG LVL3 (GOWN DISPOSABLE) ×15
GOWN STRL REUS W/TWL XL LVL3 (GOWN DISPOSABLE) ×3
HEMOSTAT POWDER SURGIFOAM 1G (HEMOSTASIS) ×12 IMPLANT
HEMOSTAT SURGICEL 2X14 (HEMOSTASIS) ×4 IMPLANT
INSERT FOGARTY 61MM (MISCELLANEOUS) IMPLANT
INSERT FOGARTY XLG (MISCELLANEOUS) IMPLANT
KIT BASIN OR (CUSTOM PROCEDURE TRAY) ×4 IMPLANT
KIT CATH CPB BARTLE (MISCELLANEOUS) ×4 IMPLANT
KIT SUCTION CATH 14FR (SUCTIONS) ×4 IMPLANT
KIT TURNOVER KIT B (KITS) ×4 IMPLANT
KIT VASOVIEW HEMOPRO 2 VH 4000 (KITS) ×4 IMPLANT
NS IRRIG 1000ML POUR BTL (IV SOLUTION) ×20 IMPLANT
PACK E OPEN HEART (SUTURE) ×4 IMPLANT
PACK OPEN HEART (CUSTOM PROCEDURE TRAY) ×4 IMPLANT
PAD ARMBOARD 7.5X6 YLW CONV (MISCELLANEOUS) ×8 IMPLANT
PAD ELECT DEFIB RADIOL ZOLL (MISCELLANEOUS) ×4 IMPLANT
PENCIL BUTTON HOLSTER BLD 10FT (ELECTRODE) ×4 IMPLANT
POSITIONER HEAD DONUT 9IN (MISCELLANEOUS) ×4 IMPLANT
PUNCH AORTIC ROTATE 4.0MM (MISCELLANEOUS) IMPLANT
PUNCH AORTIC ROTATE 4.5MM 8IN (MISCELLANEOUS) ×4 IMPLANT
PUNCH AORTIC ROTATE 5MM 8IN (MISCELLANEOUS) IMPLANT
SET MPS 3-ND DEL (MISCELLANEOUS) ×1 IMPLANT
SPONGE INTESTINAL PEANUT (DISPOSABLE) IMPLANT
SPONGE T-LAP 18X18 ~~LOC~~+RFID (SPONGE) ×12 IMPLANT
SPONGE T-LAP 4X18 ~~LOC~~+RFID (SPONGE) ×6 IMPLANT
SUPPORT HEART JANKE-BARRON (MISCELLANEOUS) ×4 IMPLANT
SUT BONE WAX W31G (SUTURE) ×3 IMPLANT
SUT MNCRL AB 4-0 PS2 18 (SUTURE) ×1 IMPLANT
SUT PROLENE 3 0 SH DA (SUTURE) IMPLANT
SUT PROLENE 3 0 SH1 36 (SUTURE) ×4 IMPLANT
SUT PROLENE 4 0 RB 1 (SUTURE)
SUT PROLENE 4 0 SH DA (SUTURE) IMPLANT
SUT PROLENE 4-0 RB1 .5 CRCL 36 (SUTURE) IMPLANT
SUT PROLENE 5 0 C 1 36 (SUTURE) ×1 IMPLANT
SUT PROLENE 6 0 C 1 30 (SUTURE) IMPLANT
SUT PROLENE 7 0 BV 1 (SUTURE) ×2 IMPLANT
SUT PROLENE 7 0 BV1 MDA (SUTURE) ×4 IMPLANT
SUT PROLENE 8 0 BV175 6 (SUTURE) IMPLANT
SUT SILK  1 MH (SUTURE)
SUT SILK 1 MH (SUTURE) IMPLANT
SUT SILK 2 0 SH (SUTURE) IMPLANT
SUT STEEL STERNAL CCS#1 18IN (SUTURE) ×1 IMPLANT
SUT STEEL SZ 6 DBL 3X14 BALL (SUTURE) ×2 IMPLANT
SUT VIC AB 1 CTX 36 (SUTURE) ×6
SUT VIC AB 1 CTX36XBRD ANBCTR (SUTURE) ×6 IMPLANT
SUT VIC AB 2-0 CT1 27 (SUTURE) ×6
SUT VIC AB 2-0 CT1 TAPERPNT 27 (SUTURE) IMPLANT
SUT VIC AB 2-0 CTX 27 (SUTURE) IMPLANT
SUT VIC AB 3-0 SH 27 (SUTURE)
SUT VIC AB 3-0 SH 27X BRD (SUTURE) IMPLANT
SUT VIC AB 3-0 X1 27 (SUTURE) IMPLANT
SUT VICRYL 4-0 PS2 18IN ABS (SUTURE) IMPLANT
SYSTEM SAHARA CHEST DRAIN ATS (WOUND CARE) ×4 IMPLANT
TAPE CLOTH SURG 4X10 WHT LF (GAUZE/BANDAGES/DRESSINGS) ×1 IMPLANT
TAPE PAPER 2X10 WHT MICROPORE (GAUZE/BANDAGES/DRESSINGS) ×1 IMPLANT
TOWEL GREEN STERILE (TOWEL DISPOSABLE) ×4 IMPLANT
TOWEL GREEN STERILE FF (TOWEL DISPOSABLE) ×4 IMPLANT
TRAY FOLEY SLVR 16FR TEMP STAT (SET/KITS/TRAYS/PACK) ×4 IMPLANT
TUBING LAP HI FLOW INSUFFLATIO (TUBING) ×4 IMPLANT
UNDERPAD 30X36 HEAVY ABSORB (UNDERPADS AND DIAPERS) ×4 IMPLANT
WATER STERILE IRR 1000ML POUR (IV SOLUTION) ×8 IMPLANT

## 2022-03-28 NOTE — Anesthesia Procedure Notes (Signed)
Central Venous Catheter Insertion ?Performed by: Val Eagle, MD, anesthesiologist ?Start/End5/06/2022 7:11 AM, 03/28/2022 7:21 AM ?Patient location: Pre-op. ?Preanesthetic checklist: patient identified, IV checked, site marked, risks and benefits discussed, surgical consent, monitors and equipment checked, pre-op evaluation, timeout performed and anesthesia consent ?Hand hygiene performed  and maximum sterile barriers used  ?PA cath was placed.Swan type:thermodilution ?PA Cath depth:47 ?Procedure performed without using ultrasound guided technique. ?Attempts: 1 ?Patient tolerated the procedure well with no immediate complications. ? ? ? ?

## 2022-03-28 NOTE — Interval H&P Note (Signed)
History and Physical Interval Note: ? ?03/28/2022 ?6:57 AM ? ?John Delacruz  has presented today for surgery, with the diagnosis of CAD.  The various methods of treatment have been discussed with the patient and family. After consideration of risks, benefits and other options for treatment, the patient has consented to  Procedure(s): ?CORONARY ARTERY BYPASS GRAFTING (CABG) (N/A) ?TRANSESOPHAGEAL ECHOCARDIOGRAM (TEE) (N/A) as a surgical intervention.  The patient's history has been reviewed, patient examined, no change in status, stable for surgery.  I have reviewed the patient's chart and labs.  Questions were answered to the patient's satisfaction.   ? ? ?Alleen Borne ? ? ?

## 2022-03-28 NOTE — Transfer of Care (Signed)
Immediate Anesthesia Transfer of Care Note ? ?Patient: John Delacruz ? ?Procedure(s) Performed: CORONARY ARTERY BYPASS GRAFTING X4, USING LEFT INTERNAL MAMMARY ARTERY, LEFT AND RIGHT GREATER SAPHENOUS VEIN HARVESTED ENDOSCOPICALLY (Chest) ?TRANSESOPHAGEAL ECHOCARDIOGRAM (TEE) ? ?Patient Location: SICU ? ?Anesthesia Type:General ? ?Level of Consciousness: Patient remains intubated per anesthesia plan ? ?Airway & Oxygen Therapy: Patient remains intubated per anesthesia plan and Patient placed on Ventilator (see vital sign flow sheet for setting) ? ?Post-op Assessment: Report given to RN and Post -op Vital signs reviewed and stable ? ?Post vital signs: Reviewed and stable ? ?Last Vitals:  ?Vitals Value Taken Time  ?BP 78/43 03/28/22 1318  ?Temp 35 ?C 03/28/22 1334  ?Pulse 80 03/28/22 1334  ?Resp 12 03/28/22 1334  ?SpO2 100 % 03/28/22 1334  ?Vitals shown include unvalidated device data. ? ?Last Pain:  ?Vitals:  ? 03/28/22 0556  ?TempSrc: Oral  ?   ? ?  ? ?Complications: No notable events documented. ?

## 2022-03-28 NOTE — Anesthesia Procedure Notes (Signed)
Central Venous Catheter Insertion ?Performed by: Val Eagle, MD, anesthesiologist ?Start/End5/06/2022 7:11 AM, 03/28/2022 7:21 AM ?Patient location: Pre-op. ?Preanesthetic checklist: patient identified, IV checked, site marked, risks and benefits discussed, surgical consent, monitors and equipment checked, pre-op evaluation, timeout performed and anesthesia consent ?Position: supine ?Lidocaine 1% used for infiltration and patient sedated ?Hand hygiene performed  and maximum sterile barriers used  ?Catheter size: 8.5 Fr ?Sheath introducer ?Procedure performed using ultrasound guided technique. ?Ultrasound Notes:anatomy identified, needle tip was noted to be adjacent to the nerve/plexus identified, no ultrasound evidence of intravascular and/or intraneural injection and image(s) printed for medical record ?Attempts: 1 ?Following insertion, line sutured, dressing applied and Biopatch. ?Post procedure assessment: blood return through all ports, free fluid flow and no air ? ?Patient tolerated the procedure well with no immediate complications. ? ? ? ? ?

## 2022-03-28 NOTE — Procedures (Signed)
Extubation Procedure Note ? ?Patient Details:   ?Name: Davontae Prusinski ?DOB: Aug 04, 1961 ?MRN: [id number] ?  ?Airway Documentation:  ?Airway 8 mm (Active)  ?Secured at (cm) 24 cm 03/28/22 1623  ?Measured From Lips 03/28/22 1623  ?Secured Location Right 03/28/22 1623  ?Secured By [employer] 03/28/22 1623  ?Cuff Pressure (cm H2O) Green OR 18-26 CmH2O 03/28/22 1318  ?Site Condition Dry 03/28/22 1623  ? ?Vent end date: (not recorded) Vent end time: (not recorded)  ? ?Evaluation ? O2 sats: stable throughout and currently acceptable ?Complications: No apparent complications ?Patient did tolerate procedure well. ?Bilateral Breath Sounds: Diminished ?  ?Yes ? ?Patient was able to perform a NIF of -20 and a VC of 760 ml's with good effort on first attempt. Cuff leak was heard. No stridor was noted upon extubation. RN at the bedside with RT during extubation. ? ?Darolyn Rua ?03/28/2022, 4:52 PM ? ?

## 2022-03-28 NOTE — Progress Notes (Signed)
CT Surgery PM Rounds ? ?Stable after CABG this am ?Extubated, neuro intact ?A-paced 80/min for sinus brady ?Post extubation ABG pending ?CT output minimal ? ?Blood pressure 104/76, pulse 82, temperature 99.3 ?F (37.4 ?C), resp. rate 15, height 5\' 9"  (1.753 m), weight 87.1 kg, SpO2 96 %.  ? ?

## 2022-03-28 NOTE — Brief Op Note (Signed)
03/28/2022 ? ?9:47 AM ? ?PATIENT:  John Delacruz  61 y.o. male ? ?PRE-OPERATIVE DIAGNOSIS:  Coronary Artery Disease ? ?POST-OPERATIVE DIAGNOSIS:  Coronary Artery Disease ? ?PROCEDURE:  Procedure(s): ? ?CORONARY ARTERY BYPASS GRAFTING x 4 ?-LIMA to LAD ?-SVG to OM 1 ?-SVG to OM 2 ?-SVG to DISTAL RCA ? ? ?TRANSESOPHAGEAL ECHOCARDIOGRAM (TEE) (N/A) ? ?ENDOSCOPIC HARVEST GREATER SAPHENOUS VEIN ?-Right and Left Thigh ?Vein harvest time: 65 min Vein prep time: 20 min ? ?SURGEON:  Surgeon(s) and Role: ?   * Bartle, Payton Doughty, MD - Primary ? ?PHYSICIAN ASSISTANT: Lowella Dandy PA-C ? ?ASSISTANTS: Tanda Rockers RNFA  ? ?ANESTHESIA:   general ? ?EBL:  300 mL  ? ?BLOOD ADMINISTERED:   CC CELLSAVER ? ?DRAINS:  Left Pleural Chest Tube, Mediastinal Chest Drains   ? ?LOCAL MEDICATIONS USED:  NONE ? ?SPECIMEN:  No Specimen ? ?DISPOSITION OF SPECIMEN:  N/A ? ?COUNTS:  YES ? ?TOURNIQUET:  * No tourniquets in log * ? ?DICTATION: .Dragon Dictation ? ?PLAN OF CARE: Admit to inpatient  ? ?PATIENT DISPOSITION:  ICU - intubated and hemodynamically stable. ?  ?Delay start of Pharmacological VTE agent (>24hrs) due to surgical blood loss or risk of bleeding: yes ? ?

## 2022-03-28 NOTE — Anesthesia Postprocedure Evaluation (Signed)
Anesthesia Post Note ? ?Patient: John Delacruz ? ?Procedure(s) Performed: CORONARY ARTERY BYPASS GRAFTING X4, USING LEFT INTERNAL MAMMARY ARTERY, LEFT AND RIGHT GREATER SAPHENOUS VEIN HARVESTED ENDOSCOPICALLY (Chest) ?TRANSESOPHAGEAL ECHOCARDIOGRAM (TEE) ? ?  ? ?Patient location during evaluation: SICU ?Anesthesia Type: General ?Level of consciousness: sedated ?Pain management: pain level controlled ?Vital Signs Assessment: post-procedure vital signs reviewed and stable ?Respiratory status: patient remains intubated per anesthesia plan ?Cardiovascular status: stable ?Postop Assessment: no apparent nausea or vomiting ?Anesthetic complications: no ? ? ?No notable events documented. ? ?Last Vitals:  ?Vitals:  ? 03/28/22 0728 03/28/22 1318  ?BP:  (!) 78/43  ?Pulse: (!) 47 80  ?Resp: 13 12  ?Temp:    ?SpO2: 96% 100%  ?  ?Last Pain:  ?Vitals:  ? 03/28/22 0556  ?TempSrc: Oral  ? ? ?  ?  ?  ?  ?  ?  ? ?Rosangela Fehrenbach ? ? ? ? ?

## 2022-03-28 NOTE — Anesthesia Procedure Notes (Signed)
Procedure Name: Intubation ?Date/Time: 03/28/2022 8:00 AM ?Performed by: Amadeo Garnet, CRNA ?Pre-anesthesia Checklist: Patient identified, Emergency Drugs available, Suction available and Patient being monitored ?Patient Re-evaluated:Patient Re-evaluated prior to induction ?Oxygen Delivery Method: Circle system utilized ?Preoxygenation: Pre-oxygenation with 100% oxygen ?Induction Type: IV induction ?Ventilation: Mask ventilation without difficulty ?Laryngoscope Size: 4 and Mac ?Grade View: Grade I ?Tube type: Oral ?Tube size: 8.0 mm ?Number of attempts: 2 ?Airway Equipment and Method: Stylet and Oral airway ?Placement Confirmation: ETT inserted through vocal cords under direct vision, positive ETCO2 and breath sounds checked- equal and bilateral ?Secured at: 23 cm ?Tube secured with: Tape ?Dental Injury: Teeth and Oropharynx as per pre-operative assessment  ? ? ? ? ?

## 2022-03-28 NOTE — Hospital Course (Addendum)
History of Present Illness: ? ?John Delacruz is a 61 year old gentleman with a history of hypertension and 1 pack/day smoking who reports about an 76-month history of exertional substernal chest discomfort which has been worsening.  It has been taking less exertion to bring on symptoms.  The chest discomfort has been relieved with rest.  He has had no rest symptoms.  He denies any shortness of breath or fatigue.  He was evaluated by Dr. Servando Salina on 02/28/2022 and underwent a gated coronary CTA with FFR showing significant three-vessel coronary disease.  Cardiac catheterization yesterday showed severe three-vessel coronary disease.  Echocardiogram showed an ejection fraction of 60 to 65% with mild concentric LVH.  There is no significant valvular abnormality.  It was felt the patient would benefit from coronary bypass grafting.  He was evaluated by Dr. Laneta Simmers at which time the patient  ? stated  he still works.  He has remained active mowing his yard and riding a stationary bicycle.  He continues to smoke 1 pack of cigarettes per day and has for 30 years.  It was felt coronary bypass grafting would be indicated, the risks and benefits of the procedure were explained to the patient and he was agreeable to proceed. ? ?Hospital Course: ? ?John Delacruz presented to [facility] on 03/28/2022.  He was taken to the operating room and underwent CABG x 4 utilizing LIMA to LAD, SVG to OM 1, SVG to OM 2, and SVG to Distal RCA.  He also underwent endoscopic harvest of greater saphenous vein from his right and left thigh.  He tolerated the procedure without difficulty and was taken to the SICU in stable condition.  The patient was extubated the evening surgery.  He underwent debridement of chest tubes, arterial lines, and swan ganz catheter without difficulty.  He was bradycardic and beta blocker therapy was not instituted.  His heart rate improved with rates in the 70s.  His blood pressure was in a normal range.  Low dose Beta  blocker therapy was instituted.  He was not on a statin prior to admission.  He was started on Lipitor at a low dose which can be adjusted on outpatient basis as long as he does not develop side effects.  He was felt medically stable for transfer to the progressive care unit on 03/30/2022.  The patient continued to make progress.  He remains in NSR and his pacing wires were removed without difficulty.  He was hypertensive and his home Diovan was restarted.   ?  ?

## 2022-03-28 NOTE — Op Note (Signed)
?CARDIOVASCULAR SURGERY OPERATIVE NOTE ? ?03/28/2022 ? ?Surgeon:  Alleen Borne, MD ? ?First Assistant: Lowella Dandy,  PA-C:   An experienced assistant was required given the complexity of this surgery and the standard of surgical care. The assistant was needed for endoscopic vein harvesting, exposure, dissection, suctioning, retraction of delicate tissues and sutures, instrument exchange and for overall help during this procedure. ? ? ?Preoperative Diagnosis:  Severe multi-vessel coronary artery disease ? ? ?Postoperative Diagnosis:  Same ? ? ?Procedure: ? ?Median Sternotomy ?Extracorporeal circulation ?3.   Coronary artery bypass grafting x 4 ? ?Left internal mammary artery graft to the LAD ?SVG to OM1 ?SVG to OM2 ?SVG to distal RCA ?4.   Endoscopic vein harvest from both legs ? ? ?Anesthesia:  General Endotracheal ? ? ?Clinical History/Surgical Indication: ? ?This 61 year old gentleman has severe three-vessel coronary disease with progressive exertional anginal symptoms.  I agree that coronary bypass graft surgery is the best treatment for him.  I reviewed the cardiac catheterization and echo images with the patient and his wife and answered all of their questions.  I discussed the operative procedure with the patient and his wife including alternatives, benefits and risks; including but not limited to bleeding, blood transfusion, infection, stroke, myocardial infarction, graft failure, heart block requiring a permanent pacemaker, organ dysfunction, and death.  Dina Rich understands and agrees to proceed.  ? ?Preparation: ? ?The patient was seen in the preoperative holding area and the correct patient, correct operation were confirmed with the patient after reviewing the medical record and catheterization. The consent was signed by me. Preoperative antibiotics were given. A pulmonary arterial line and radial arterial  line were placed by the anesthesia team. The patient was taken back to the operating room and positioned supine on the operating room table. After being placed under general endotracheal anesthesia by the anesthesia team a foley catheter was placed. The neck, chest, abdomen, and both legs were prepped with betadine soap and solution and draped in the usual sterile manner. A surgical time-out was taken and the correct patient and operative procedure were confirmed with the nursing and anesthesia staff. ? ? ?Cardiopulmonary Bypass: ? ?A median sternotomy was performed. The pericardium was opened in the midline. Right ventricular function appeared normal. The ascending aorta was of normal size and had no palpable plaque. There were no contraindications to aortic cannulation or cross-clamping. The patient was fully systemically heparinized and the ACT was maintained > 400 sec. The proximal aortic arch was cannulated with a 20 F aortic cannula for arterial inflow. Venous cannulation was performed via the right atrial appendage using a two-staged venous cannula. An antegrade cardioplegia/vent cannula was inserted into the mid-ascending aorta. Aortic occlusion was performed with a single cross-clamp. Systemic cooling to 32 degrees Centigrade and topical cooling of the heart with iced saline were used. Hyperkalemic antegrade cold blood cardioplegia was used to induce diastolic arrest and was then given at about 20 minute intervals throughout the period of arrest to maintain myocardial temperature at or below 10 degrees centigrade. A temperature probe was inserted into the interventricular septum and an insulating pad was placed in the pericardium. ? ? ?Left internal mammary artery harvest: ? ?The left side of the sternum was retracted using the Rultract retractor. The left internal mammary artery was harvested as a pedicle graft. All side branches were clipped. It was a medium-sized vessel of good quality with excellent  blood flow. It was ligated distally and divided. It was sprayed with topical papaverine  solution to prevent vasospasm. ? ? ?Endoscopic vein harvest: ? ?The right greater saphenous vein was harvested endoscopically through a 2 cm incision medial to the right knee. It was harvested from the upper thigh to below the knee.  It was a medium-sized vein of good quality until the lower leg and then it became small. Therefore another section of GSV was harvested endoscopically from the left thigh. It was a medium sized vein of good quality.  The side branches were all ligated with 4-0 silk ties.  ? ? ?Coronary arteries: ? ?The coronary arteries were examined. ? ?LAD:  large vessel with no distal disease ?LCX:  OM1 large and diffusely diseased to the bifurcation. The two sub-branches were intramyocardial but the medial branch was located and was a medium sized graftable vessel with no disease. OM2 was medium sized and had no distal disease. ?RCA:  Diffusely diseased but the distal vessel was soft enough to graft. The PDA was intramyocardial and lying beneath a large PD vein and could not be exposed. The PL was small. ? ? ?Grafts: ? ?LIMA to the LAD: 3.0 mm. It was sewn end to side using 8-0 prolene continuous suture. ?SVG to OM1:  1.75 mm. It was sewn end to side using 7-0 prolene continuous suture. ?SVG to OM2:  1.75 mm. It was sewn end to side using 7-0 prolene continuous suture. ?SVG to distal RCA:  2.5 mm. It was sewn end to side using 7-0 prolene continuous suture. ? ?The proximal vein graft anastomoses were performed to the mid-ascending aorta using continuous 6-0 prolene suture. Graft markers were placed around the proximal anastomoses. ? ? ?Completion: ? ?The patient was rewarmed to 37 degrees Centigrade. The clamp was removed from the LIMA pedicle and there was rapid warming of the septum and return of ventricular fibrillation. The crossclamp was removed with a time of 81 minutes. There was spontaneous return of  sinus rhythm. The distal and proximal anastomoses were checked for hemostasis. The position of the grafts was satisfactory. Two temporary epicardial pacing wires were placed on the right atrium and two on the right ventricle. The patient was weaned from CPB without difficulty on no inotropes. CPB time was 99 minutes. Cardiac output was 5 LPM. TEE showed normal LV systolic function. Heparin was fully reversed with protamine and the aortic and venous cannulas removed. Hemostasis was achieved. Mediastinal and left pleural drainage tubes were placed. The sternum was closed with double #6 stainless steel wires. The fascia was closed with continuous # 1 vicryl suture. The subcutaneous tissue was closed with 2-0 vicryl continuous suture. The skin was closed with 3-0 vicryl subcuticular suture. All sponge, needle, and instrument counts were reported correct at the end of the case. Dry sterile dressings were placed over the incisions and around the chest tubes which were connected to pleurevac suction. The patient was then transported to the surgical intensive care unit in  stable condition. ? ?  ?

## 2022-03-28 NOTE — Anesthesia Procedure Notes (Signed)
Arterial Line Insertion ?Start/End5/06/2022 7:00 AM, 03/28/2022 7:10 AM ?Performed by: Colon Flattery, CRNA, CRNA ? Patient location: Pre-op. ?Preanesthetic checklist: patient identified, IV checked, site marked, risks and benefits discussed, surgical consent, monitors and equipment checked, pre-op evaluation, timeout performed and anesthesia consent ?Lidocaine 1% used for infiltration ?Left, radial was placed ?Catheter size: 20 G ?Hand hygiene performed  and maximum sterile barriers used  ? ?Attempts: 2 ?Procedure performed without using ultrasound guided technique. ?Following insertion, Biopatch and dressing applied. ?Post procedure assessment: normal ? ?Patient tolerated the procedure well with no immediate complications. ? ? ?

## 2022-03-29 ENCOUNTER — Inpatient Hospital Stay (HOSPITAL_COMMUNITY): Payer: 59

## 2022-03-29 ENCOUNTER — Encounter (HOSPITAL_COMMUNITY): Payer: Self-pay | Admitting: Surgery

## 2022-03-29 LAB — GLUCOSE, CAPILLARY
Glucose-Capillary: 139 mg/dL — ABNORMAL HIGH (ref 70–99)
Glucose-Capillary: 141 mg/dL — ABNORMAL HIGH (ref 70–99)
Glucose-Capillary: 159 mg/dL — ABNORMAL HIGH (ref 70–99)
Glucose-Capillary: 139 mg/dL — ABNORMAL HIGH (ref 70–99)
Glucose-Capillary: 132 mg/dL — ABNORMAL HIGH (ref 70–99)
Glucose-Capillary: 138 mg/dL — ABNORMAL HIGH (ref 70–99)
Glucose-Capillary: 166 mg/dL — ABNORMAL HIGH (ref 70–99)
Glucose-Capillary: 127 mg/dL — ABNORMAL HIGH (ref 70–99)
Glucose-Capillary: 174 mg/dL — ABNORMAL HIGH (ref 70–99)
Glucose-Capillary: 149 mg/dL — ABNORMAL HIGH (ref 70–99)
Glucose-Capillary: 182 mg/dL — ABNORMAL HIGH (ref 70–99)
Glucose-Capillary: 245 mg/dL — ABNORMAL HIGH (ref 70–99)
Glucose-Capillary: 94 mg/dL (ref 70–99)
Glucose-Capillary: 141 mg/dL — ABNORMAL HIGH (ref 70–99)

## 2022-03-29 LAB — BASIC METABOLIC PANEL
Anion gap: 6 (ref 5–15)
Anion gap: 8 (ref 5–15)
BUN: 8 mg/dL (ref 8–23)
BUN: 9 mg/dL (ref 8–23)
CO2: 25 mmol/L (ref 22–32)
CO2: 26 mmol/L (ref 22–32)
Calcium: 8.5 mg/dL — ABNORMAL LOW (ref 8.9–10.3)
Calcium: 9 mg/dL (ref 8.9–10.3)
Chloride: 107 mmol/L (ref 98–111)
Chloride: 104 mmol/L (ref 98–111)
Creatinine, Ser: 1.25 mg/dL — ABNORMAL HIGH (ref 0.61–1.24)
Creatinine, Ser: 1.28 mg/dL — ABNORMAL HIGH (ref 0.61–1.24)
GFR, Estimated: >60 mL/min (ref >60)
GFR, Estimated: >60 mL/min (ref >60)
Glucose, Bld: 141 mg/dL — ABNORMAL HIGH (ref 70–99)
Glucose, Bld: 96 mg/dL (ref 70–99)
Potassium: 4.4 mmol/L (ref 3.5–5.1)
Potassium: 4 mmol/L (ref 3.5–5.1)
Sodium: 138 mmol/L (ref 135–145)
Sodium: 138 mmol/L (ref 135–145)

## 2022-03-29 LAB — CBC
HCT: 29.8 % — ABNORMAL LOW (ref 39.0–52.0)
HCT: 29.2 % — ABNORMAL LOW (ref 39.0–52.0)
Hemoglobin: 10.4 g/dL — ABNORMAL LOW (ref 13.0–17.0)
Hemoglobin: 10.1 g/dL — ABNORMAL LOW (ref 13.0–17.0)
MCH: 29.8 pg (ref 26.0–34.0)
MCH: 29.7 pg (ref 26.0–34.0)
MCHC: 34.9 g/dL (ref 30.0–36.0)
MCHC: 34.6 g/dL (ref 30.0–36.0)
MCV: 85.4 fL (ref 80.0–100.0)
MCV: 85.9 fL (ref 80.0–100.0)
Platelets: 173 10*3/uL (ref 150–400)
Platelets: 192 10*3/uL (ref 150–400)
RBC: 3.49 MIL/uL — ABNORMAL LOW (ref 4.22–5.81)
RBC: 3.4 MIL/uL — ABNORMAL LOW (ref 4.22–5.81)
RDW: 14.5 % (ref 11.5–15.5)
RDW: 14.9 % (ref 11.5–15.5)
WBC: 9.3 10*3/uL (ref 4.0–10.5)
WBC: 10.9 10*3/uL — ABNORMAL HIGH (ref 4.0–10.5)
nRBC: 0 % (ref 0.0–0.2)
nRBC: 0 % (ref 0.0–0.2)

## 2022-03-29 LAB — MAGNESIUM
Magnesium: 2.2 mg/dL (ref 1.7–2.4)
Magnesium: 2.4 mg/dL (ref 1.7–2.4)

## 2022-03-29 MED ORDER — INSULIN DETEMIR 100 UNIT/ML ~~LOC~~ SOLN
15.0000 [IU] | Freq: Every day | SUBCUTANEOUS | Status: DC
  Administered 2022-03-29: 15 [IU] via SUBCUTANEOUS
  Filled 2022-03-29 (×2): qty 0.15

## 2022-03-29 MED ORDER — INSULIN ASPART 100 UNIT/ML IJ SOLN
0.0000 [IU] | INTRAMUSCULAR | Status: DC
  Administered 2022-03-29: 8 [IU] via SUBCUTANEOUS
  Administered 2022-03-29 – 2022-03-30 (×2): 2 [IU] via SUBCUTANEOUS

## 2022-03-29 MED ORDER — ENOXAPARIN SODIUM 40 MG/0.4ML IJ SOSY
40.0000 mg | PREFILLED_SYRINGE | Freq: Every day | INTRAMUSCULAR | Status: DC
  Administered 2022-03-29 – 2022-03-31 (×3): 40 mg via SUBCUTANEOUS
  Filled 2022-03-29 (×3): qty 0.4

## 2022-03-29 MED FILL — Thrombin (Recombinant) For Soln 20000 Unit: CUTANEOUS | Qty: 1 | Status: AC

## 2022-03-29 NOTE — Discharge Instructions (Signed)

## 2022-03-29 NOTE — Progress Notes (Signed)
1 Day Post-Op Procedure(s) (LRB): ?CORONARY ARTERY BYPASS GRAFTING X4, USING LEFT INTERNAL MAMMARY ARTERY, LEFT AND RIGHT GREATER SAPHENOUS VEIN HARVESTED ENDOSCOPICALLY (N/A) ?TRANSESOPHAGEAL ECHOCARDIOGRAM (TEE) (N/A) ?Subjective: ?No complaints ? ?Objective: ?Vital signs in last 24 hours: ?Temp:  [95 ?F (35 ?C)-99.3 ?F (37.4 ?C)] 97.9 ?F (36.6 ?C) (05/09 0630) ?Pulse Rate:  [20-90] 85 (05/09 0630) ?Cardiac Rhythm: Atrial paced (05/09 0600) ?Resp:  [9-30] 22 (05/09 0630) ?BP: (78-138)/(43-113) 111/69 (05/09 0630) ?SpO2:  [90 %-100 %] 95 % (05/09 0630) ?Arterial Line BP: (88-164)/(49-78) 122/64 (05/09 0630) ?FiO2 (%):  [40 %-50 %] 40 % (05/08 1623) ?Weight:  [88.5 kg] 88.5 kg (05/09 0547) ? ?Hemodynamic parameters for last 24 hours: ?PAP: (3-286)/(-13-254) 35/13 ?CVP:  [4 mmHg] 4 mmHg ?CO:  [3.6 L/min-6.6 L/min] 4.9 L/min ?CI:  [1.8 L/min/m2-3.3 L/min/m2] 2.4 L/min/m2 ? ?Intake/Output from previous day: ?05/08 0701 - 05/09 0700 ?In: 5475.7 [P.O.:165; I.V.:3535.1; Blood:125; IV Piggyback:1650.6] ?Out: 7150 [Urine:6150; Blood:300; Chest Tube:700] ?Intake/Output this shift: ?Total I/O ?In: 1065.4 [P.O.:165; I.V.:684.3; IV Piggyback:216.2] ?Out: 1895 [Urine:1525; Chest Tube:370] ? ?General appearance: alert and cooperative ?Neurologic: intact ?Heart: regular rate and rhythm, S1, S2 normal, no murmur ?Lungs: clear to auscultation bilaterally ?Extremities: no edema ?Wound: dressing dry ? ?Lab Results: ?Recent Labs  ?  03/28/22 ?2108 03/29/22 ?0315  ?WBC 9.2 9.3  ?HGB 9.7* 10.4*  ?HCT 29.2* 29.8*  ?PLT 182 173  ? ?BMET:  ?Recent Labs  ?  03/28/22 ?2108 03/29/22 ?0315  ?NA 138 138  ?K 4.7 4.4  ?CL 107 107  ?CO2 25 25  ?GLUCOSE 174* 141*  ?BUN 8 8  ?CREATININE 1.23 1.25*  ?CALCIUM 8.2* 8.5*  ?  ?PT/INR:  ?Recent Labs  ?  03/28/22 ?1328  ?LABPROT 17.7*  ?INR 1.5*  ? ?ABG ?   ?Component Value Date/Time  ? PHART 7.360 03/28/2022 1800  ? HCO3 24.3 03/28/2022 1800  ? TCO2 26 03/28/2022 1800  ? ACIDBASEDEF 1.0 03/28/2022 1800  ?  O2SAT 97 03/28/2022 1800  ? ?CBG (last 3)  ?Recent Labs  ?  03/29/22 ?0320 03/29/22 ?0423 03/29/22 ?0503  ?GLUCAP 139* 132* 138*  ? ?CXR: clear ? ?ECG: sinus brady, no acute changes. ? ?Assessment/Plan: ?S/P Procedure(s) (LRB): ?CORONARY ARTERY BYPASS GRAFTING X4, USING LEFT INTERNAL MAMMARY ARTERY, LEFT AND RIGHT GREATER SAPHENOUS VEIN HARVESTED ENDOSCOPICALLY (N/A) ?TRANSESOPHAGEAL ECHOCARDIOGRAM (TEE) (N/A) ? ?POD 1 ? ?Hemodynamically stable. Sinus brady 58-60. Will hold off on beta blocker for now. ? ?Wt is only 3 lbs over preop. Hold off on diuresis. ? ?Glucose under good control on minimal insulin drip. Will transition to Levemir and SSI. Preop Hgb A1c was 5.9 on no meds so probably will be able to stop insulin soon. ? ?DC chest tubes, swan, arterial line. ? ?OOB, mobilize, IS. ? ? ? LOS: 1 day  ? ? ?Alleen Borne ?03/29/2022 ? ? ?

## 2022-03-29 NOTE — Progress Notes (Signed)
?  Transition of Care (TOC) Screening Note ? ? ?Patient Details  ?Name: John Delacruz ?Date of Birth: Jul 20, 1961 ? ? ?Transition of Care (TOC) CM/SW Contact:    ?Delilah Shan, LCSWA ?Phone Number: ?03/29/2022, 3:41 PM ? ? ? ?Transition of Care Department [facility]) has reviewed patient and no TOC needs have been identified at this time. We will continue to monitor patient advancement through interdisciplinary progression rounds. If new patient transition needs arise, please place a TOC consult. ?  ?

## 2022-03-29 NOTE — Discharge Summary (Signed)
? ?   ?[address] ?      [place] [id number] ?            [phone]   ? ?Physician Discharge Summary  ?Patient ID: ?Dina RichAnthony Carrick ?MRN: [id number] ?DOB/AGE: Mar 05, 1961 61 y.o. ? ?Admit date: 03/28/2022 ?Discharge date: 04/01/2022 ? ?Admission Diagnoses: ? ?Patient Active Problem List  ? Diagnosis Date Noted  ? CAD in native artery 03/17/2022  ? Angina pectoris (HCC)   ? Nonspecific abnormal electrocardiogram (ECG) (EKG) 02/28/2022  ? Overweight (BMI 25.0-29.9) 02/28/2022  ? Precordial pain 02/28/2022  ? Diverticulitis 12/18/2013  ? HTN (hypertension) 01/01/2013  ? ?Discharge Diagnoses:  ?Patient Active Problem List  ? Diagnosis Date Noted  ? S/P CABG x 4 03/28/2022  ? CAD in native artery 03/17/2022  ? Angina pectoris (HCC)   ? Nonspecific abnormal electrocardiogram (ECG) (EKG) 02/28/2022  ? Overweight (BMI 25.0-29.9) 02/28/2022  ? Precordial pain 02/28/2022  ? Diverticulitis 12/18/2013  ? HTN (hypertension) 01/01/2013  ? ?Discharged Condition: good ? ?History of Present Illness: ? ?Dina Richnthony Enge is a 61 year old gentleman with a history of hypertension and 1 pack/day smoking who reports about an 7167-month history of exertional substernal chest discomfort which has been worsening.  It has been taking less exertion to bring on symptoms.  The chest discomfort has been relieved with rest.  He has had no rest symptoms.  He denies any shortness of breath or fatigue.  He was evaluated by Dr. Servando Salinaobb on 02/28/2022 and underwent a gated coronary CTA with FFR showing significant three-vessel coronary disease.  Cardiac catheterization yesterday showed severe three-vessel coronary disease.  Echocardiogram showed an ejection fraction of 60 to 65% with mild concentric LVH.  There is no significant valvular abnormality.  It was felt the patient would benefit from coronary bypass grafting.  He was evaluated by Dr. Laneta SimmersBartle at which time the patient  ? stated  he still works.  He has remained active mowing his yard and  riding a stationary bicycle.  He continues to smoke 1 pack of cigarettes per day and has for 30 years.  It was felt coronary bypass grafting would be indicated, the risks and benefits of the procedure were explained to the patient and he was agreeable to proceed. ? ?Hospital Course: ? ?Dina Richnthony Philbrick [facility].  He was taken to the operating room and underwent CABG x 4 utilizing LIMA to LAD, SVG to OM 1, SVG to OM 2, and SVG to Distal RCA.  He also underwent endoscopic harvest of greater saphenous vein from his right and left thigh.  He tolerated the procedure without difficulty and was taken to the SICU in stable condition.  The patient was extubated the evening surgery.  He underwent debridement of chest tubes, arterial lines, and swan ganz catheter without difficulty.  He was bradycardic and beta blocker therapy was not instituted.  His heart rate improved with rates in the 70s.  His blood pressure was in a normal range.  Low dose Beta blocker therapy was instituted.  He was not on a statin prior to admission.  He was started on Lipitor at a low dose which can be adjusted on outpatient basis as long as he does not develop side effects.  He was felt medically stable for transfer to the progressive care unit on 03/30/2022.  The patient continued to make progress.  He remains in NSR and his pacing wires were removed without difficulty.  He was hypertensive and  his home Diovan was restarted.   ?  ? ?Consults: None ? ?Significant Diagnostic Studies: angiography:  ? ?Normal left main ?Large tortuous LAD that is transapical.  Mid vessel 95% stenosis within a significant bend.  Proximal to the severe stenosis is diffuse 40% narrowing. ?Circumflex gives origin to 3 obtuse marginals.  The first marginal is large, tortuous, and contains a 75 to 80% stenosis in the apex of the initial angulation.  The distal circumflex after the origin of a small second obtuse marginal there is 90% stenosis.  The third marginal itself contains a proximal 80% stenosis. ?The RCA is dominant.  There is segmental 60 to 70% proximal to mid stenosis (FFR positive by CT FFR), followed by tortuosity before a 60% distal stenosis. ?Normal LVEDP with EF 65%. ?  ?RECOMMENDATIONS: ?  ?Given significant angulation and tortuosity, I believe the patient would do better with multivessel bypass surgery including a LIMA to LAD with saphenous vein grafts to the first and third obtuse marginal into the distal right coronary right ventricular branch. ?We will expedite visit with the surgical team ?We will order 2D Doppler echocardiogram to be done prior to discharge today. ?Precautions for return to the hospital are given.  Nitroglycerin is given. ?Discussed with patient and family. ? ?Treatments: surgery:  ? ?03/28/2022 ?  ?Surgeon:  Alleen Borne, MD ?  ?First Assistant: Lowella Dandy,  PA-C:   An experienced assistant was required given the complexity of this surgery and the standard of surgical care. The assistant was needed for endoscopic vein harvesting, exposure, dissection, suctioning, retraction of delicate tissues and sutures, instrument exchange and for overall help during this procedure. ?  ?  ?Preoperative Diagnosis:  Severe multi-vessel coronary artery disease ?  ?  ?Postoperative Diagnosis:  Same ?  ?  ?Procedure: ?  ?Median Sternotomy ?Extracorporeal circulation ?3.   Coronary artery bypass grafting x 4 ?  ?Left internal mammary artery graft to the LAD ?SVG to OM1 ?SVG to OM2 ?SVG to distal RCA ?4.   Endoscopic vein harvest from both legs ? ?Discharge Exam: ?Blood pressure (!) 143/91, pulse 83, temperature 99.1 ?F (37.3 ?C), temperature source Oral, resp. rate 20, height 5\' 9"  (1.753 m), weight 83.5 kg, SpO2 96 %. ? ?General appearance: alert, cooperative, and no distress ?Heart: regular rate and rhythm ?Lungs: clear to auscultation bilaterally ?Abdomen: soft, non-tender; bowel sounds normal; no masses,  no  organomegaly ?Extremities: edema none present, ecchymosis BLE ?Wound: clean and dry ? ? ?Discharge Medications: ? ?The patient has been discharged on: ? ? ?1.Beta Blocker:  Yes [ X  ] ?                             No   [   ] ?                             If No, reason: ? ?2.Ace Inhibitor/ARB: Yes [ X ] ?                                    No  [    ] ?  If No, reason: ? ?3.Statin:   Yes [ X  ] ?                 No  [   ] ?                 If No, reason: ? ?4.Ecasa:  Yes  [ X  ] ?                 No   [   ] ?                 If No, reason: ? ?Patient had ACS upon admission: No  ? ?Plavix/P2Y12 inhibitor: Yes [   ] ?                                     No  [ N  ] ? ? ? ? ? ?Allergies as of 04/01/2022   ? ?   Reactions  ? Lisinopril   ? Blurred vision  ? ?  ? ?  ?Medication List  ?  ? ?STOP taking these medications   ? ?ibuprofen 200 MG tablet ?Commonly known as: ADVIL ?  ?nitroGLYCERIN 0.4 MG SL tablet ?Commonly known as: NITROSTAT ?  ? ?  ? ?TAKE these medications   ? ?acetaminophen 500 MG tablet ?Commonly known as: TYLENOL ?Take 1-2 tablets (500-1,000 mg total) by mouth every 6 (six) hours as needed. ?  ?amLODipine 10 MG tablet ?Commonly known as: NORVASC ?Take 10 mg by mouth daily. ?  ?aspirin EC 81 MG tablet ?Take 1 tablet (81 mg total) by mouth daily. Swallow whole. ?  ?atorvastatin 20 MG tablet ?Commonly known as: LIPITOR ?Take 1 tablet (20 mg total) by mouth daily. ?  ?cyclobenzaprine 10 MG tablet ?Commonly known as: FLEXERIL ?Take 1 tablet (10 mg total) by mouth 2 (two) times daily as needed for muscle spasms. ?  ?metoprolol succinate 25 MG 24 hr tablet ?Commonly known as: Toprol XL ?Take 1 tablet (25 mg total) by mouth daily. ?  ?oxymetazoline 0.05 % nasal spray ?Commonly known as: AFRIN ?Place 1 spray into both nostrils 2 (two) times daily as needed for congestion. ?  ?traMADol 50 MG tablet ?Commonly known as: ULTRAM ?Take 1-2 tablets (50-100 mg total) by mouth every 4 (four)  hours as needed for moderate pain. ?  ?Turmeric 500 MG Tabs ?Take 500 mg by mouth daily. ?  ?valsartan-hydrochlorothiazide 80-12.5 MG tablet ?Commonly known as: DIOVAN-HCT ?Take 1 tablet by mouth daily. ?  ?VITAMIN B-12 PO

## 2022-03-29 NOTE — Progress Notes (Signed)
EVENING ROUNDS NOTE : ? ?   ?[address] ?      [name] [address] ?            [phone]   ?              ?1 Day Post-Op ?Procedure(s) (LRB): ?CORONARY ARTERY BYPASS GRAFTING X4, USING LEFT INTERNAL MAMMARY ARTERY, LEFT AND RIGHT GREATER SAPHENOUS VEIN HARVESTED ENDOSCOPICALLY (N/A) ?TRANSESOPHAGEAL ECHOCARDIOGRAM (TEE) (N/A) ? ? ?Total Length of Stay:  LOS: 1 day  ?Events:   ?No events ?Stable day ?Resting comfortably in bed ? ? ? ?BP 122/85   Pulse 80   Temp 98.1 ?F (36.7 ?C) (Oral)   Resp (!) 22   Ht 5\' 9"  (1.753 m)   Wt 88.5 kg   SpO2 92%   BMI 28.81 kg/m?  ? ?PAP: (19-41)/(6-24) 40/15 ?CO:  [4.9 L/min-6.6 L/min] 4.9 L/min ?CI:  [2.4 L/min/m2-3.3 L/min/m2] 2.4 L/min/m2 ? ?Vent Mode: CPAP;PSV ?FiO2 (%):  [40 %] 40 % ?PEEP:  [5 cmH20] 5 cmH20 ?Pressure Support:  [10 cmH20] 10 cmH20 ? ? sodium chloride Stopped (03/29/22 0553)  ? sodium chloride    ? sodium chloride 10 mL/hr at 03/28/22 1340  ?  ceFAZolin (ANCEF) IV 200 mL/hr at 03/29/22 1500  ? lactated ringers    ? lactated ringers 20 mL/hr at 03/29/22 1500  ? nitroGLYCERIN Stopped (03/28/22 1609)  ? phenylephrine (NEO-SYNEPHRINE) Adult infusion Stopped (03/28/22 2341)  ? ? ?I/O last 3 completed shifts: ?In: 5475.7 [P.O.:165; I.V.:3535.1; Blood:125; IV Piggyback:1650.6] ?Out: 7150 [Urine:6150; Blood:300; Chest Tube:700] ? ? ? ?  Latest Ref Rng & Units 03/29/2022  ?  3:15 AM 03/28/2022  ?  9:08 PM 03/28/2022  ?  6:00 PM  ?CBC  ?WBC 4.0 - 10.5 K/uL 9.3   9.2     ?Hemoglobin 13.0 - 17.0 g/dL 05/28/2022   9.7   9.9    ?Hematocrit 39.0 - 52.0 % 29.8   29.2   29.0    ?Platelets 150 - 400 K/uL 173   182     ? ? ? ?  Latest Ref Rng & Units 03/29/2022  ?  3:15 AM 03/28/2022  ?  9:08 PM 03/28/2022  ?  6:00 PM  ?BMP  ?Glucose 70 - 99 mg/dL 05/28/2022   572     ?BUN 8 - 23 mg/dL 8   8     ?Creatinine 0.61 - 1.24 mg/dL 620   3.55     ?Sodium 135 - 145 mmol/L 138   138   140    ?Potassium 3.5 - 5.1 mmol/L 4.4   4.7   4.2    ?Chloride 98 - 111 mmol/L 107   107     ?CO2 22 -  32 mmol/L 25   25     ?Calcium 8.9 - 10.3 mg/dL 8.5   8.2     ? ? ?ABG ?   ?Component Value Date/Time  ? PHART 7.360 03/28/2022 1800  ? PCO2ART 43.2 03/28/2022 1800  ? PO2ART 97 03/28/2022 1800  ? HCO3 24.3 03/28/2022 1800  ? TCO2 26 03/28/2022 1800  ? ACIDBASEDEF 1.0 03/28/2022 1800  ? O2SAT 97 03/28/2022 1800  ? ? ? ? ? ?05/28/2022, MD ?03/29/2022 4:17 PM ? ? ?

## 2022-03-30 ENCOUNTER — Inpatient Hospital Stay (HOSPITAL_COMMUNITY): Payer: 59

## 2022-03-30 LAB — BASIC METABOLIC PANEL
Anion gap: 7 (ref 5–15)
BUN: 11 mg/dL (ref 8–23)
CO2: 25 mmol/L (ref 22–32)
Calcium: 9.1 mg/dL (ref 8.9–10.3)
Chloride: 105 mmol/L (ref 98–111)
Creatinine, Ser: 1.48 mg/dL — ABNORMAL HIGH (ref 0.61–1.24)
GFR, Estimated: 53 mL/min — ABNORMAL LOW (ref >60)
Glucose, Bld: 127 mg/dL — ABNORMAL HIGH (ref 70–99)
Potassium: 3.9 mmol/L (ref 3.5–5.1)
Sodium: 137 mmol/L (ref 135–145)

## 2022-03-30 LAB — CBC
HCT: 28.7 % — ABNORMAL LOW (ref 39.0–52.0)
Hemoglobin: 9.8 g/dL — ABNORMAL LOW (ref 13.0–17.0)
MCH: 29.4 pg (ref 26.0–34.0)
MCHC: 34.1 g/dL (ref 30.0–36.0)
MCV: 86.2 fL (ref 80.0–100.0)
Platelets: 176 10*3/uL (ref 150–400)
RBC: 3.33 MIL/uL — ABNORMAL LOW (ref 4.22–5.81)
RDW: 14.9 % (ref 11.5–15.5)
WBC: 10.9 10*3/uL — ABNORMAL HIGH (ref 4.0–10.5)
nRBC: 0 % (ref 0.0–0.2)

## 2022-03-30 LAB — GLUCOSE, CAPILLARY
Glucose-Capillary: 113 mg/dL — ABNORMAL HIGH (ref 70–99)
Glucose-Capillary: 117 mg/dL — ABNORMAL HIGH (ref 70–99)
Glucose-Capillary: 131 mg/dL — ABNORMAL HIGH (ref 70–99)
Glucose-Capillary: 81 mg/dL (ref 70–99)

## 2022-03-30 MED ORDER — SODIUM CHLORIDE 0.9 % IV SOLN: 250.0000 mL | INTRAVENOUS | Status: DC | PRN

## 2022-03-30 MED ORDER — METOPROLOL TARTRATE 12.5 MG HALF TABLET
12.5000 mg | ORAL_TABLET | Freq: Two times a day (BID) | ORAL | Status: DC
Start: 2022-03-30 — End: 2022-04-01
  Administered 2022-03-30 – 2022-04-01 (×5): 12.5 mg via ORAL
  Filled 2022-03-30 (×5): qty 1

## 2022-03-30 MED ORDER — ~~LOC~~ CARDIAC SURGERY, PATIENT & FAMILY EDUCATION: Freq: Once | Status: AC

## 2022-03-30 MED ORDER — SODIUM CHLORIDE 0.9% FLUSH: 3.0000 mL | Freq: Two times a day (BID) | INTRAVENOUS | Status: DC

## 2022-03-30 MED ORDER — SODIUM CHLORIDE 0.9% FLUSH: 3.0000 mL | INTRAVENOUS | Status: DC | PRN

## 2022-03-30 MED ORDER — CHLORHEXIDINE GLUCONATE CLOTH 2 % EX PADS: 6.0000 | MEDICATED_PAD | Freq: Every day | CUTANEOUS | Status: DC

## 2022-03-30 MED ORDER — ATORVASTATIN CALCIUM 10 MG PO TABS
20.0000 mg | ORAL_TABLET | Freq: Every day | ORAL | Status: DC
  Administered 2022-03-30 – 2022-04-01 (×3): 20 mg via ORAL
  Filled 2022-03-30 (×3): qty 2

## 2022-03-30 MED FILL — Electrolyte-R (PH 7.4) Solution: INTRAVENOUS | Qty: 3000 | Status: AC

## 2022-03-30 MED FILL — Heparin Sodium (Porcine) Inj 1000 Unit/ML: Qty: 1000 | Status: AC

## 2022-03-30 MED FILL — Calcium Chloride Inj 10%: INTRAVENOUS | Qty: 10 | Status: AC

## 2022-03-30 MED FILL — Lidocaine HCl Local Soln Prefilled Syringe 100 MG/5ML (2%): INTRAMUSCULAR | Qty: 5 | Status: AC

## 2022-03-30 MED FILL — Heparin Sodium (Porcine) Inj 1000 Unit/ML: INTRAMUSCULAR | Qty: 10 | Status: AC

## 2022-03-30 MED FILL — Potassium Chloride Inj 2 mEq/ML: INTRAVENOUS | Qty: 40 | Status: AC

## 2022-03-30 MED FILL — Mannitol IV Soln 20%: INTRAVENOUS | Qty: 500 | Status: AC

## 2022-03-30 MED FILL — Sodium Bicarbonate IV Soln 8.4%: INTRAVENOUS | Qty: 50 | Status: AC

## 2022-03-30 MED FILL — Magnesium Sulfate Inj 50%: INTRAMUSCULAR | Qty: 10 | Status: AC

## 2022-03-30 NOTE — Progress Notes (Addendum)
? ?   ?  [address] ?      [place] [address] ?            [phone]   ? ?  ?2 Days Post-Op Procedure(s) (LRB): ?CORONARY ARTERY BYPASS GRAFTING X4, USING LEFT INTERNAL MAMMARY ARTERY, LEFT AND RIGHT GREATER SAPHENOUS VEIN HARVESTED ENDOSCOPICALLY (N/A) ?TRANSESOPHAGEAL ECHOCARDIOGRAM (TEE) (N/A) ? ?Subjective: ? ?Patient sitting up in chair.  Pain well controlled.  Denies N/V  + flatus, no BM yet ? ?Objective: ?Vital signs in last 24 hours: ?Temp:  [93.9 ?F (34.4 ?C)-98.4 ?F (36.9 ?C)] 97.8 ?F (36.6 ?C) (05/10 [id number]) ?Pulse Rate:  [62-82] 65 (05/10 0626) ?Cardiac Rhythm: Normal sinus rhythm (05/10 0600) ?Resp:  [13-30] 17 (05/10 [id number]) ?BP: (103-131)/(69-89) 114/75 (05/10 [id number]) ?SpO2:  [88 %-97 %] 96 % (05/10 0626) ?Arterial Line BP: (113-160)/(57-74) 129/59 (05/09 1245) ?Weight:  [87.4 kg] 87.4 kg (05/10 0500) ? ?Hemodynamic parameters for last 24 hours: ?PAP: (34-40)/(14-16) 39/14 ? ?Intake/Output from previous day: ?05/09 0701 - 05/10 0700 ?In: 1583.7 [P.O.:1020; I.V.:263.8; IV Piggyback:299.9] ?Out: 1125 [Urine:1045; Chest Tube:80] ? ?General appearance: alert, cooperative, and no distress ?Heart: regular rate and rhythm ?Lungs: clear to auscultation bilaterally ?Abdomen: soft, non-tender; bowel sounds normal; no masses,  no organomegaly ?Extremities: edema trace ?Wound: clean and dry ? ?Lab Results: ?Recent Labs  ?  03/29/22 ?[id number] 03/30/22 ?0140  ?WBC 10.9* 10.9*  ?HGB 10.1* 9.8*  ?HCT 29.2* 28.7*  ?PLT 192 176  ? ?BMET:  ?Recent Labs  ?  03/29/22 ?1656 03/30/22 ?0140  ?NA 138 137  ?K 4.0 3.9  ?CL 104 105  ?CO2 26 25  ?GLUCOSE 96 127*  ?BUN 9 11  ?CREATININE 1.28* 1.48*  ?CALCIUM 9.0 9.1  ?  ?PT/INR:  ?Recent Labs  ?  03/28/22 ?1328  ?LABPROT 17.7*  ?INR 1.5*  ? ?ABG ?   ?Component Value Date/Time  ? PHART 7.360 03/28/2022 1800  ? HCO3 24.3 03/28/2022 1800  ? TCO2 26 03/28/2022 1800  ? ACIDBASEDEF 1.0 03/28/2022 1800  ? O2SAT 97 03/28/2022 1800  ? ?CBG (last 3)  ?Recent Labs  ?   03/30/22 ?[id number] 03/30/22 ?[id number] 03/30/22 ?[id number]  ?GLUCAP 131* 113* 117*  ? ? ?Assessment/Plan: ?S/P Procedure(s) (LRB): ?CORONARY ARTERY BYPASS GRAFTING X4, USING LEFT INTERNAL MAMMARY ARTERY, LEFT AND RIGHT GREATER SAPHENOUS VEIN HARVESTED ENDOSCOPICALLY (N/A) ?TRANSESOPHAGEAL ECHOCARDIOGRAM (TEE) (N/A) ? ?CV- NSR, HR improved into the 70s, SBP ranging 100-120s- will start low dose Lopressor with parameters, will start Lipitor as patient not previously on a statin ?Pulm- no acute issues, weaning oxygen as tolerated ?Renal- creatinine at 1.48, not currently on diuretic therapy will hold off for now, repeat BMET in AM ?Expected post operative blood loss anemia- Hgb at 9.8 ?CBGs controlled, patient is not a diabetic will d/c insulin, SSIP ?Dispo- patient stable, maintaining NSR will attempt low dose BB, start Lipitor, hold off on diuretics today, weight is stable, will transfer to 4E ? ? LOS: 2 days  ? ? ?Ellwood Handler, PA-C ?03/30/2022 ? ? Chart reviewed, patient examined, agree with above. ?He is progressing along the normal path.  ?

## 2022-03-31 ENCOUNTER — Other Ambulatory Visit: Payer: Self-pay

## 2022-03-31 ENCOUNTER — Inpatient Hospital Stay (HOSPITAL_COMMUNITY): Payer: 59

## 2022-03-31 ENCOUNTER — Encounter (HOSPITAL_COMMUNITY): Payer: Self-pay | Admitting: Surgery

## 2022-03-31 LAB — BASIC METABOLIC PANEL
Anion gap: 8 (ref 5–15)
BUN: 13 mg/dL (ref 8–23)
CO2: 25 mmol/L (ref 22–32)
Calcium: 9 mg/dL (ref 8.9–10.3)
Chloride: 104 mmol/L (ref 98–111)
Creatinine, Ser: 1.27 mg/dL — ABNORMAL HIGH (ref 0.61–1.24)
GFR, Estimated: >60 mL/min (ref >60)
Glucose, Bld: 117 mg/dL — ABNORMAL HIGH (ref 70–99)
Potassium: 4.3 mmol/L (ref 3.5–5.1)
Sodium: 137 mmol/L (ref 135–145)

## 2022-03-31 LAB — CBC
HCT: 27.5 % — ABNORMAL LOW (ref 39.0–52.0)
Hemoglobin: 9.1 g/dL — ABNORMAL LOW (ref 13.0–17.0)
MCH: 29 pg (ref 26.0–34.0)
MCHC: 33.1 g/dL (ref 30.0–36.0)
MCV: 87.6 fL (ref 80.0–100.0)
Platelets: 185 10*3/uL (ref 150–400)
RBC: 3.14 MIL/uL — ABNORMAL LOW (ref 4.22–5.81)
RDW: 14.9 % (ref 11.5–15.5)
WBC: 10.4 10*3/uL (ref 4.0–10.5)
nRBC: 0 % (ref 0.0–0.2)

## 2022-03-31 MED ORDER — IRBESARTAN 150 MG PO TABS
75.0000 mg | ORAL_TABLET | Freq: Every day | ORAL | Status: DC
  Administered 2022-03-31 – 2022-04-01 (×2): 75 mg via ORAL
  Filled 2022-03-31 (×2): qty 1

## 2022-03-31 MED ORDER — LACTULOSE 10 GM/15ML PO SOLN: 20.0000 g | Freq: Every day | ORAL | Status: DC | PRN

## 2022-03-31 NOTE — Progress Notes (Addendum)
? ?   ?  [address] ?      [name] [address] ?            [phone]   ? ?  ? ?3 Days Post-Op Procedure(s) (LRB): ?CORONARY ARTERY BYPASS GRAFTING X4, USING LEFT INTERNAL MAMMARY ARTERY, LEFT AND RIGHT GREATER SAPHENOUS VEIN HARVESTED ENDOSCOPICALLY (N/A) ?TRANSESOPHAGEAL ECHOCARDIOGRAM (TEE) (N/A) ? ?Subjective: ? ?Up in bed. States he is feeling good and is ready to go.  He would like to ambulate more as he did not walk yesterday.   ? ?Objective: ?Vital signs in last 24 hours: ?Temp:  [98 ?F (36.7 ?C)-98.5 ?F (36.9 ?C)] 98.2 ?F (36.8 ?C) (05/11 0350) ?Pulse Rate:  [63-82] 82 (05/11 0350) ?Cardiac Rhythm: Normal sinus rhythm (05/10 2100) ?Resp:  [16-24] 20 (05/11 0350) ?BP: (92-166)/(55-93) 166/90 (05/11 0350) ?SpO2:  [86 %-96 %] 92 % (05/11 0350) ?Weight:  [84.7 kg] 84.7 kg (05/11 0350) ? ?Intake/Output from previous day: ?05/10 0701 - 05/11 0700 ?In: 718.7 [P.O.:240; I.V.:278.7; IV Piggyback:200] ?Out: -  ? ?General appearance: alert, cooperative, and no distress ?Heart: regular rate and rhythm ?Lungs: clear to auscultation bilaterally ?Abdomen: soft, non-tender; bowel sounds normal; no masses,  no organomegaly ?Extremities: no edema present, mild ecchymosis B thigh ?Wound: clean and dry ? ?Lab Results: ?Recent Labs  ?  03/30/22 ?0140 03/31/22 ?2725  ?WBC 10.9* 10.4  ?HGB 9.8* 9.1*  ?HCT 28.7* 27.5*  ?PLT 176 185  ? ?BMET:  ?Recent Labs  ?  03/30/22 ?0140 03/31/22 ?3664  ?NA 137 137  ?K 3.9 4.3  ?CL 105 104  ?CO2 25 25  ?GLUCOSE 127* 117*  ?BUN 11 13  ?CREATININE 1.48* 1.27*  ?CALCIUM 9.1 9.0  ?  ?PT/INR:  ?Recent Labs  ?  03/28/22 ?1328  ?LABPROT 17.7*  ?INR 1.5*  ? ?ABG ?   ?Component Value Date/Time  ? PHART 7.360 03/28/2022 1800  ? HCO3 24.3 03/28/2022 1800  ? TCO2 26 03/28/2022 1800  ? ACIDBASEDEF 1.0 03/28/2022 1800  ? O2SAT 97 03/28/2022 1800  ? ?CBG (last 3)  ?Recent Labs  ?  03/30/22 ?4034 03/30/22 ?7425 03/30/22 ?1133  ?GLUCAP 113* 117* 81  ? ? ?Assessment/Plan: ?S/P Procedure(s)  (LRB): ?CORONARY ARTERY BYPASS GRAFTING X4, USING LEFT INTERNAL MAMMARY ARTERY, LEFT AND RIGHT GREATER SAPHENOUS VEIN HARVESTED ENDOSCOPICALLY (N/A) ?TRANSESOPHAGEAL ECHOCARDIOGRAM (TEE) (N/A) ? ?CV- NSR, + HTN- tolerating Lopressor at 12.5 mg BID, will resume home Avapro ?Pulm- off oxygen, CXR has been ordered but not yet taken, continue IS ?Renal- creatinine stable, no LE edema on exam, weight is at baseline, no lasix needed at this time ?Expected post operative blood loss anemia, mild Hgb at 9.1 ?Dispo- patient stable, doing well overall, tolerating BB w/o difficulty, will resume home Diovan for additional BP control, d/c EPW today.. if remains stable likely for d/c in AM ? ? LOS: 3 days  ? ? ?Lowella Dandy, PA-C ?03/31/2022 ? ? Chart reviewed, patient examined, agree with above. ?He is doing well and plan home tomorrow if no changes. ?

## 2022-03-31 NOTE — Progress Notes (Signed)
Mobility Specialist Progress Note ? ? 03/31/22 1100  ?Mobility  ?Activity Ambulated with assistance in hallway  ?Level of Assistance Standby assist, set-up cues, supervision of patient - no hands on  ?Assistive Device Front wheel walker  ?Distance Ambulated (ft) 450 ft  ?Activity Response Tolerated well  ?$Mobility charge 1 Mobility  ? ?Pre Mobility:  75 HR, 122/75 BP,  ?During Mobility: 85 HR ?Post Mobility: 78 HR, 148/85 BP, 91% SpO2 ? ?Received pt in bed having no complaints and agreeable to mobility. Able to express certain sternal precautions but would like a handout for further details. Spoke to cardiac rehab about further education and handout. Asymptomatic throughout ambulation, returned back to bed w/ call bell in reach and all needs met. ? ?Holland Falling ?Mobility Specialist ?Phone Number [phone] ? ?

## 2022-03-31 NOTE — Plan of Care (Signed)
  Problem: Health Behavior/Discharge Planning: Goal: Ability to manage health-related needs will improve Outcome: Progressing   Problem: Clinical Measurements: Goal: Respiratory complications will improve Outcome: Progressing Goal: Cardiovascular complication will be avoided Outcome: Progressing   

## 2022-03-31 NOTE — Progress Notes (Signed)
CARDIAC REHAB PHASE I  ? ?PRE:  Rate/Rhythm: 80 SR ? ?BP:  Sitting: 132/75     ? ?SaO2: 95 RA ? ?MODE:  Ambulation: 400 ft  ? ?POST:  Rate/Rhythm: 95 SR ? ?BP:  Sitting: 138/73 ? ?  SaO2: 92 RA ? ? ?Pt eager to ambulate. Pt ambulated 421ft in hallway standby assist with front wheel walker. Pt denies CP, SOB, or dizziness. Pt returned to EOB. Encouraged continued IS use, walks, and sternal precautions. Will continue to follow. ? ?[phone] ?Rufina Falco, RN BSN ?03/31/2022 ?8:15 AM ? ?

## 2022-03-31 NOTE — Progress Notes (Signed)
Pacing wires removed.  Patient tolerated well.  Patient educated on bed rest. ? ? ? 03/31/22 1236  ?Vitals  ?Temp 99.1 ?F (37.3 ?C)  ?Temp Source Oral  ?BP 131/80  ?MAP (mmHg) 95  ?BP Location Left Arm  ?BP Method Automatic  ?Patient Position (if appropriate) Lying  ?Pulse Rate 71  ?Pulse Rate Source Monitor  ?ECG Heart Rate 75  ?Resp (!) 21  ?Level of Consciousness  ?Level of Consciousness Alert  ?MEWS COLOR  ?MEWS Score Color Green  ?Oxygen Therapy  ?SpO2 94 %  ?O2 Device Room Air  ?MEWS Score  ?MEWS Temp 0  ?MEWS Systolic 0  ?MEWS Pulse 0  ?MEWS RR 1  ?MEWS LOC 0  ?MEWS Score 1  ? ? ?

## 2022-04-01 MED ORDER — ATORVASTATIN CALCIUM 20 MG PO TABS: 20.0000 mg | ORAL_TABLET | Freq: Every day | ORAL | 3 refills | Status: DC

## 2022-04-01 MED ORDER — TRAMADOL HCL 50 MG PO TABS: 50.0000 mg | ORAL_TABLET | ORAL | 0 refills | Status: DC | PRN

## 2022-04-01 MED ORDER — ACETAMINOPHEN 500 MG PO TABS: 500.0000 mg | ORAL_TABLET | Freq: Four times a day (QID) | ORAL | 0 refills | Status: AC | PRN

## 2022-04-01 NOTE — TOC Transition Note (Signed)
Transition of Care (TOC) - CM/SW Discharge Note ?Donn Pierini Charity fundraiser, BSN ?Transitions of Care ?Unit 4E- RN Case Manager ?See Treatment Team for direct phone #  ? ? ?Patient Details  ?Name: John Delacruz ?MRN: [id number] ?Date of Birth: 06/26/1961 ? ?Transition of Care (TOC) CM/SW Contact:  ?Zenda Alpers, Lenn Sink, RN ?Phone Number: ?04/01/2022, 12:49 PM ? ? ?Clinical Narrative:    ?Pt stable for transition home today, per bedside RN pt does not want RW for home- DME has been declined.  ?CM has been notified by [facility] that pt has pre-op referral with them from TCTS office for [facility] protocol- they will contact pt for start of care and follow up post discharge.  ? ?No further TOC needs noted. Family to transport home.  ? ? ?Final next level of care: Home w Home Health Services ?Barriers to Discharge: No Barriers Identified ? ? ?Patient Goals and CMS Choice ?  ?  ?Choice offered to / list presented to : NA ? ?Discharge Placement ?  ?           ? Home w/ HH ?  ?  ?  ? ?Discharge Plan and Services ?  ?Discharge Planning Services: CM Consult ?Post Acute Care Choice: Durable Medical Equipment          ?DME Arranged: Walker rolling, Patient refused services ?DME Agency: NA ?  ?  ?  ?HH Arranged: Charity fundraiser, PT ?HH Agency: [facility] ?Date HH Agency Contacted: 04/01/22 ?Time HH Agency Contacted: 1000 ?Representative spoke with at [facility] Agency: Misty Stanley ? ?Social Determinants of Health (SDOH) Interventions ?  ? ? ?Readmission Risk Interventions ?   ? View : No data to display.  ?  ?  ?  ? ? ? ? ? ?

## 2022-04-01 NOTE — Progress Notes (Addendum)
? ?   ?  [address] ?      [place] [address] ?            [phone]   ? ?  ?4 Days Post-Op Procedure(s) (LRB): ?CORONARY ARTERY BYPASS GRAFTING X4, USING LEFT INTERNAL MAMMARY ARTERY, LEFT AND RIGHT GREATER SAPHENOUS VEIN HARVESTED ENDOSCOPICALLY (N/A) ?TRANSESOPHAGEAL ECHOCARDIOGRAM (TEE) (N/A) ? ?Subjective: ? ?Sitting up in chair.  Without complaints.  + ambulation  + BM ? ?Objective: ?Vital signs in last 24 hours: ?Temp:  [98.4 ?F (36.9 ?C)-99.1 ?F (37.3 ?C)] 99.1 ?F (37.3 ?C) (05/12 0423) ?Pulse Rate:  [71-89] 83 (05/12 0423) ?Cardiac Rhythm: Normal sinus rhythm (05/11 1900) ?Resp:  [20-21] 20 (05/12 0423) ?BP: (128-143)/(75-91) 143/91 (05/12 0423) ?SpO2:  [94 %-98 %] 96 % (05/12 0423) ?Weight:  [83.5 kg] 83.5 kg (05/12 0423) ? ?General appearance: alert, cooperative, and no distress ?Heart: regular rate and rhythm ?Lungs: clear to auscultation bilaterally ?Abdomen: soft, non-tender; bowel sounds normal; no masses,  no organomegaly ?Extremities: edema none present, ecchymosis BLE ?Wound: clean and dry ? ?Lab Results: ?Recent Labs  ?  03/30/22 ?0140 03/31/22 ?4627  ?WBC 10.9* 10.4  ?HGB 9.8* 9.1*  ?HCT 28.7* 27.5*  ?PLT 176 185  ? ?BMET:  ?Recent Labs  ?  03/30/22 ?0140 03/31/22 ?0350  ?NA 137 137  ?K 3.9 4.3  ?CL 105 104  ?CO2 25 25  ?GLUCOSE 127* 117*  ?BUN 11 13  ?CREATININE 1.48* 1.27*  ?CALCIUM 9.1 9.0  ?  ?PT/INR: No results for input(s): LABPROT, INR in the last 72 hours. ?ABG ?   ?Component Value Date/Time  ? PHART 7.360 03/28/2022 1800  ? HCO3 24.3 03/28/2022 1800  ? TCO2 26 03/28/2022 1800  ? ACIDBASEDEF 1.0 03/28/2022 1800  ? O2SAT 97 03/28/2022 1800  ? ?CBG (last 3)  ?Recent Labs  ?  03/30/22 ?0938 03/30/22 ?1829 03/30/22 ?1133  ?GLUCAP 113* 117* 81  ? ? ?Assessment/Plan: ?S/P Procedure(s) (LRB): ?CORONARY ARTERY BYPASS GRAFTING X4, USING LEFT INTERNAL MAMMARY ARTERY, LEFT AND RIGHT GREATER SAPHENOUS VEIN HARVESTED ENDOSCOPICALLY (N/A) ?TRANSESOPHAGEAL ECHOCARDIOGRAM (TEE)  (N/A) ? ?CV- NSR, + HTN- continue Lopressor, Diovan, and will resume home Norvasc ?Pulm- no acute issues, off oxygen, instructed on continued IS use at home ?Renal- creatinine stable, weight below admission, no lasix indicated at this time ?Dispo- patient doing well, maintaining NSR, titration of home BP medications, for discharge this AM ? ? LOS: 4 days  ? ? ?John Dandy, PA-C ?04/01/2022 ? ? Chart reviewed, patient examined, agree with above. ?He looks good and feels well. Rhythm has been stable NSR. Plan home today. ?

## 2022-04-01 NOTE — Progress Notes (Signed)
CARDIAC REHAB PHASE I  ? ?D/c education completed with pt. Pt educated on importance of site care and monitoring incisions daily. Encouraged continued IS use, walks, and sternal precautions. Pt given in-the-tube sheet along with heart healthy diet. Reviewed restrictions and exercise guidelines. Will refer to CRP II [place]. ? ?[phone] ?Reynold Bowen, RN BSN ?04/01/2022 ?8:47 AM ? ?

## 2022-04-01 NOTE — Progress Notes (Incomplete)
Order received to discharge patient.  Telemetry monitor removed and CCMD notified.  PIV access removed.  Discharge instructions, follow up, medications and instructions for their use discussed with patient. 

## 2022-04-02 ENCOUNTER — Encounter: Payer: Self-pay | Admitting: Surgery

## 2022-04-02 LAB — ECHO INTRAOPERATIVE TEE
AV Mean grad: 3 mmHg
Height: 69 in
MV Vena cont: 0.2 cm
Weight: 3072 oz

## 2022-04-04 ENCOUNTER — Telehealth: Payer: Self-pay | Admitting: *Deleted

## 2022-04-04 NOTE — Telephone Encounter (Signed)
Patient sent message through [employer] over the weekend with c/o ankle swelling, constipation and insomnia. Patient called to discuss symptoms. Per patient, the ankle swelling has decreased since sending the message. Patient denies SOB. Advised patient to elevate legs while at rest to help prevent swelling. Patient stated constipation has resolved itself after introduction of Ducolax. Patient c/o inability to sleep. Advised patient he may take over the counter aids such as Melatonin or Tylenol PM to help him get more restful sleep. Reviewed appointments with patient. Patient verbalized understanding.  ?

## 2022-04-06 ENCOUNTER — Telehealth (HOSPITAL_COMMUNITY): Payer: Self-pay

## 2022-04-06 NOTE — Telephone Encounter (Signed)
Pt insurance is active and benefits verified through [employer] Co-pay 0, DED $800/$800 met, out of pocket $3,000/$3,000 met, co-insurance 30%. no pre-authorization required. Passport, Matthew/[employer] 04/06/2022@4:03pm, REF# [id number] ? ?Will contact patient to see if he is interested in the Cardiac Rehab Program. If interested, patient will need to complete follow up appt. Once completed, patient will be contacted for scheduling upon review by the RN Navigator. ?

## 2022-04-06 NOTE — Telephone Encounter (Signed)
Attempted to call patient in regards to Cardiac Rehab - LM on VM 

## 2022-04-07 ENCOUNTER — Ambulatory Visit (INDEPENDENT_AMBULATORY_CARE_PROVIDER_SITE_OTHER): Payer: Self-pay

## 2022-04-07 ENCOUNTER — Telehealth (HOSPITAL_COMMUNITY): Payer: Self-pay

## 2022-04-07 DIAGNOSIS — Z4802 Encounter for removal of sutures: Secondary | ICD-10-CM

## 2022-04-07 NOTE — Progress Notes (Signed)
Patient arrived for nurse visit to remove suture/staples post- procedure CABG x4 with Dr. Laneta Simmers 03/28/22.  3 Sutures removed with no signs/ symptoms of infection noted.  Patient tolerated procedure well.  Patient/ family instructed to keep the incision sites clean and dry.  Patient/ family acknowledged instructions given.

## 2022-04-07 NOTE — Telephone Encounter (Signed)
Pt returned phone call and stated that he is interested in the cardiac rehab program. Martin Majestic over insurance benefits and advised pt that we would contact him for scheduling after f/u appt at a later date

## 2022-04-10 ENCOUNTER — Encounter (HOSPITAL_COMMUNITY): Payer: Self-pay | Admitting: Emergency Medicine

## 2022-04-10 ENCOUNTER — Other Ambulatory Visit: Payer: Self-pay

## 2022-04-10 ENCOUNTER — Emergency Department (HOSPITAL_COMMUNITY)
Admission: EM | Admit: 2022-04-10 | Discharge: 2022-04-11 | Disposition: A | Discharge status: Home / Self Care | Payer: 59 | Attending: Emergency Medicine | Admitting: Emergency Medicine

## 2022-04-10 DIAGNOSIS — K30 Functional dyspepsia: Secondary | ICD-10-CM | POA: Diagnosis not present

## 2022-04-10 DIAGNOSIS — R109 Unspecified abdominal pain: Secondary | ICD-10-CM | POA: Diagnosis present

## 2022-04-10 DIAGNOSIS — Z7982 Long term (current) use of aspirin: Secondary | ICD-10-CM | POA: Diagnosis not present

## 2022-04-10 DIAGNOSIS — Z79899 Other long term (current) drug therapy: Secondary | ICD-10-CM | POA: Diagnosis not present

## 2022-04-10 NOTE — ED Triage Notes (Signed)
Patient reports indigestion " burping" after eating supper this evening , he took Rolaids with relief. Denies chest pain / no SOB . No emesis or diaphoresis .

## 2022-04-11 NOTE — Discharge Instructions (Signed)

## 2022-04-11 NOTE — ED Provider Notes (Signed)
[facility] EMERGENCY DEPARTMENT Provider Note   CSN: [id number] Arrival date & time: 04/10/22  2257     History  Chief Complaint  Patient presents with   Indigestion     John Delacruz is a 61 y.o. male.  The history is provided by the patient and the spouse.  Patient presents for concern for indigestion.  He reports he ate a large meal that included fried chicken and drinking Ensure, and soon after began having abdominal discomfort.  He reports he started to burp but had a bowel movement and is now feeling improved.  No chest pain or shortness of breath.  No vomiting or diaphoresis.  Denies any burning into his chest  Patient reports he had a recent cardiac bypass surgery and wanted to be evaluated to make sure his heart was okay    Home Medications Prior to Admission medications   Medication Sig Start Date End Date Taking? Authorizing Provider  acetaminophen (TYLENOL) 500 MG tablet Take 1-2 tablets (500-1,000 mg total) by mouth every 6 (six) hours as needed. 04/01/22   Barrett, Erin R, PA-C  amLODipine (NORVASC) 10 MG tablet Take 10 mg by mouth daily.    [provider]  aspirin EC 81 MG tablet Take 1 tablet (81 mg total) by mouth daily. Swallow whole. 03/17/22 03/17/23  Lyn Records, MD  atorvastatin (LIPITOR) 20 MG tablet Take 1 tablet (20 mg total) by mouth daily. 04/01/22   Barrett, Erin R, PA-C  Cyanocobalamin (VITAMIN B-12 PO) Take 1 tablet by mouth daily.    [provider]  cyclobenzaprine (FLEXERIL) 10 MG tablet Take 1 tablet (10 mg total) by mouth 2 (two) times daily as needed for muscle spasms. 01/21/22   Tomi Bamberger, PA-C  metoprolol succinate (TOPROL XL) 25 MG 24 hr tablet Take 1 tablet (25 mg total) by mouth daily. 03/17/22 03/17/23  Lyn Records, MD  oxymetazoline (AFRIN) 0.05 % nasal spray Place 1 spray into both nostrils 2 (two) times daily as needed for congestion.    [provider]  traMADol (ULTRAM) 50 MG tablet  Take 1-2 tablets (50-100 mg total) by mouth every 4 (four) hours as needed for moderate pain. 04/01/22   Barrett, Erin R, PA-C  Turmeric 500 MG TABS Take 500 mg by mouth daily.    [provider]  valsartan-hydrochlorothiazide (DIOVAN-HCT) 80-12.5 MG tablet Take 1 tablet by mouth daily. 12/20/21   [provider]  VITAMIN D PO Take 1 capsule by mouth daily.    [provider]      Allergies    Lisinopril    Review of Systems   Review of Systems  Constitutional:  Negative for fever.  Respiratory:  Negative for shortness of breath.   Cardiovascular:  Negative for chest pain.  Gastrointestinal:  Negative for vomiting.   Physical Exam Updated Vital Signs BP (!) 148/92 (BP Location: Right Arm)   Pulse 85   Temp 98.3 F (36.8 C) (Oral)   Resp 18   SpO2 97%  Physical Exam CONSTITUTIONAL: Well developed/well nourished HEAD: Normocephalic/atraumatic EYES: EOMI ENMT: Mucous membranes moist NECK: supple no meningeal signs SPINE/BACK:entire spine nontender CV: S1/S2 noted, no loud harsh murmur LUNGS: Lungs are clear to auscultation bilaterally, no apparent distress ABDOMEN: soft, nontender NEURO: Pt is awake/alert/appropriate, moves all extremitiesx4.  No facial droop.   EXTREMITIES:  full ROM SKIN: warm, color normal sternotomy incision is appearing to heal well PSYCH: no abnormalities of mood noted, alert and oriented  to situation  ED Results / Procedures / Treatments   Labs (all labs ordered are listed, but only abnormal results are displayed) Labs Reviewed - No data to display  EKG EKG Interpretation  Date/Time:  Sunday Apr 10 2022 23:04:42 EDT Ventricular Rate:  82 PR Interval:  146 QRS Duration: 80 QT Interval:  406 QTC Calculation: 474 R Axis:   98 Text Interpretation: Normal sinus rhythm Rightward axis Septal infarct , age undetermined Abnormal ECG Confirmed by Zadie Rhine ([id number]) on 04/11/2022 12:09:47 AM  Radiology No results  found.  Procedures Procedures    Medications Ordered in ED Medications - No data to display  ED Course/ Medical Decision Making/ A&P                           Medical Decision Making  Patient presented for evaluation due to potential heartburn.  He reports recent cardiac bypass surgery 1 to be evaluated.  He denies any chest pain or shortness of breath.  He reports all the symptoms improved after he had a bowel movement and burped EKG is unremarkable.  I do not feel any further testing is warranted at this time.  Patient is safe for discharge        Final Clinical Impression(s) / ED Diagnoses Final diagnoses:  Indigestion    Rx / DC Orders ED Discharge Orders     None         Zadie Rhine, MD 04/11/22 [phone]

## 2022-04-12 ENCOUNTER — Telehealth (HOSPITAL_COMMUNITY): Payer: Self-pay | Admitting: Family Medicine

## 2022-04-18 NOTE — Progress Notes (Unsigned)
Office Visit    Patient Name: John Delacruz Date of Encounter: 04/19/2022  Primary Care Provider:  Mila Palmer, MD Primary Cardiologist:  Thomasene Ripple, DO  Chief Complaint   61 year old male with a history of CAD s/p CABG x4, hypertension, and tobacco use who presents for post hospital follow-up related to CAD s/p CABG x4.  Past Medical History    Past Medical History:  Diagnosis Date   Anginal pain (HCC) 02/2022   Coronary artery disease    Hypertension    Past Surgical History:  Procedure Laterality Date   CORONARY ARTERY BYPASS GRAFT N/A 03/28/2022   Procedure: CORONARY ARTERY BYPASS GRAFTING X4, USING LEFT INTERNAL MAMMARY ARTERY, LEFT AND RIGHT GREATER SAPHENOUS VEIN HARVESTED ENDOSCOPICALLY;  Surgeon: Alleen Borne, MD;  Location: [facility] OR;  Service: Open Heart Surgery;  Laterality: N/A;   FRACTURE SURGERY     HERNIA REPAIR     LEFT HEART CATH AND CORONARY ANGIOGRAPHY N/A 03/17/2022   Procedure: LEFT HEART CATH AND CORONARY ANGIOGRAPHY;  Surgeon: Lyn Records, MD;  Location: [facility] INVASIVE CV LAB;  Service: Cardiovascular;  Laterality: N/A;   TEE WITHOUT CARDIOVERSION N/A 03/28/2022   Procedure: TRANSESOPHAGEAL ECHOCARDIOGRAM (TEE);  Surgeon: Alleen Borne, MD;  Location: [facility] OR;  Service: Open Heart Surgery;  Laterality: N/A;    Allergies  Allergies  Allergen Reactions   Lisinopril     Blurred vision     History of Present Illness    61 year old male with the above past medical history including CAD s/p CABG x4, hypertension, and tobacco use.  He was initially evaluated by Dr. Servando Salina in April 2023 in the setting of intermittent exertional chest discomfort which he described as a pressure-like sensation. He underwent coronary CTA with FFR which revealed multivessel obstructive CAD.  He underwent subsequent diagnostic cardiac catheterization on 03/17/2022 which revealed multivessel CAD with significant angulation and tortuosity (pLAD 40%, mLAD 95%, OM1 75-80%, dLCx  90%, OM# 80%, p-mRCA 60-70%, and dRCA 60% stenosis).  Echocardiogram showed EF 60 to 65%, normal LV function, no RWMA, mild concentric LVH, normal LV diastolic parameters, normal RV systolic function, no significant valvular abnormalities. CT surgery was consulted and he underwent CABG x4  (LIMA-LAD, SVG-OM1, SVG-OM2, and SVG-dRCA) on 03/28/2022 by Dr. Laneta Simmers.  He was hospitalized from 03/28/2022 to 04/01/2022.  His postop hospital course was uncomplicated.  He was discharged home in stable condition on 04/01/2022.  He presented to the ED on 04/11/2022 with complaints of indigestion.  EKG was unremarkable, and he was discharged home.  He presents today for follow-up accompanied by his wife.  Since his hospitalization and since his recent ED visit he has been stable from a cardiac standpoint. He is walking a total of 30 minutes daily (in 3 divided walks). He denies any symptoms concerning for angina. He does report some back pain, and states he has reached out to Dr. Laneta Simmers about the possibility of receiving a steroid injection. Otherwise, he reports feeling well and denies any additional concerns today.  Home Medications    Current Outpatient Medications  Medication Sig Dispense Refill   acetaminophen (TYLENOL) 500 MG tablet Take 1-2 tablets (500-1,000 mg total) by mouth every 6 (six) hours as needed. 30 tablet 0   amLODipine (NORVASC) 10 MG tablet Take 10 mg by mouth daily.     aspirin EC 81 MG tablet Take 1 tablet (81 mg total) by mouth daily. Swallow whole. 150 tablet 2   atorvastatin (LIPITOR) 20 MG tablet Take  1 tablet (20 mg total) by mouth daily. 30 tablet 3   Cyanocobalamin (VITAMIN B-12 PO) Take 1 tablet by mouth daily.     cyclobenzaprine (FLEXERIL) 10 MG tablet Take 1 tablet (10 mg total) by mouth 2 (two) times daily as needed for muscle spasms. 20 tablet 0   metoprolol succinate (TOPROL XL) 25 MG 24 hr tablet Take 1 tablet (25 mg total) by mouth daily. 30 tablet 11   oxymetazoline (AFRIN) 0.05 %  nasal spray Place 1 spray into both nostrils 2 (two) times daily as needed for congestion.     traMADol (ULTRAM) 50 MG tablet Take 1-2 tablets (50-100 mg total) by mouth every 4 (four) hours as needed for moderate pain. 30 tablet 0   Turmeric 500 MG TABS Take 500 mg by mouth daily.     valsartan-hydrochlorothiazide (DIOVAN-HCT) 80-12.5 MG tablet Take 1 tablet by mouth daily.     VITAMIN D PO Take 1 capsule by mouth daily.     traZODone (DESYREL) 100 MG tablet Take 50-100 mg by mouth at bedtime.     Current Facility-Administered Medications  Medication Dose Route Frequency Provider Last Rate Last Admin   nitroGLYCERIN (NITROSTAT) SL tablet 0.4 mg  0.4 mg Sublingual Q5 min PRN Lyn RecordsSmith, Henry W, MD         Review of Systems    He denies chest pain, palpitations, dyspnea, pnd, orthopnea, n, v, dizziness, syncope, edema, weight gain, or early satiety. All other systems reviewed and are otherwise negative except as noted above.    Cardiac Rehabilitation Eligibility Assessment  The patient is ready to start cardiac rehabilitation pending clearance from the cardiac surgeon.    Physical Exam    VS:  BP 120/62   Pulse 63   Ht 5\' 9"  (1.753 m)   Wt 177 lb 9.6 oz (80.6 kg)   SpO2 99%   BMI 26.23 kg/m  GEN: Well nourished, well developed, in no acute distress. HEENT: normal. Neck: Supple, no JVD, carotid bruits, or masses. Cardiac: RRR, no murmurs, rubs, or gallops. No clubbing, cyanosis, edema.  Radials/DP/PT 2+ and equal bilaterally.  Respiratory:  Respirations regular and unlabored, clear to auscultation bilaterally. GI: Soft, nontender, nondistended, BS + x 4. MS: no deformity or atrophy. Skin: warm and dry, no rash.  Sternal incision well approximated, clean, dry, intact.  Right radial cath site without bruising, bleeding, hematoma. Neuro:  Strength and sensation are intact. Psych: Normal affect.  Accessory Clinical Findings    ECG personally reviewed by me today -NSR, 63 bpm- no  acute changes.  Lab Results  Component Value Date   WBC 10.4 03/31/2022   HGB 9.1 (L) 03/31/2022   HCT 27.5 (L) 03/31/2022   MCV 87.6 03/31/2022   PLT 185 03/31/2022   Lab Results  Component Value Date   CREATININE 1.27 (H) 03/31/2022   BUN 13 03/31/2022   NA 137 03/31/2022   K 4.3 03/31/2022   CL 104 03/31/2022   CO2 25 03/31/2022   Lab Results  Component Value Date   ALT 18 03/24/2022   AST 22 03/24/2022   ALKPHOS 53 03/24/2022   BILITOT 0.6 03/24/2022   No results found for: CHOL, HDL, LDLCALC, LDLDIRECT, TRIG, CHOLHDL  Lab Results  Component Value Date   HGBA1C 5.9 (H) 03/24/2022    Assessment & Plan    1. CAD: S/p CABG x4  (LIMA-LAD, SVG-OM1, SVG-OM2, and SVG-dRCA) on 03/28/2022.  Echo showed EF 60 to 65%, normal LV function, no RWMA,  mild concentric LVH, normal LV diastolic parameters, normal RV systolic function, no significant valvular abnormalities. Stable with no anginal symptoms. Will check CBC given post-op anemia.  Continue aspirin, amlodipine, metoprolol, valsartan-HCTZ, and Lipitor.  2. Hypertension: BP well controlled. Continue current antihypertensive regimen.   3. Hyperlipidemia: LDL was 124 in 11/2021. Will check fasting lipid panel, LFTs in 4 weeks.  Continue aspirin, Lipitor.  4. Tobacco use: He has quit smoking.  I congratulated him on this.  5. Disposition: Follow-up as scheduled with Dr. Servando Salina in 2 months.   Joylene Grapes, NP 04/19/2022, 9:12 AM

## 2022-04-19 ENCOUNTER — Other Ambulatory Visit: Payer: Self-pay

## 2022-04-19 ENCOUNTER — Ambulatory Visit: Payer: 59 | Admitting: Nurse Practitioner

## 2022-04-19 ENCOUNTER — Ambulatory Visit: Payer: 59 | Admitting: Physician Assistant

## 2022-04-19 ENCOUNTER — Encounter: Payer: Self-pay | Admitting: Nurse Practitioner

## 2022-04-19 VITALS — BP 120/62 | HR 63 | Ht 69.0 in | Wt 177.6 lb

## 2022-04-19 DIAGNOSIS — E785 Hyperlipidemia, unspecified: Secondary | ICD-10-CM

## 2022-04-19 DIAGNOSIS — Z720 Tobacco use: Secondary | ICD-10-CM

## 2022-04-19 DIAGNOSIS — I10 Essential (primary) hypertension: Secondary | ICD-10-CM

## 2022-04-19 DIAGNOSIS — I2510 Atherosclerotic heart disease of native coronary artery without angina pectoris: Secondary | ICD-10-CM

## 2022-04-19 DIAGNOSIS — Z79899 Other long term (current) drug therapy: Secondary | ICD-10-CM

## 2022-04-19 LAB — CBC
Hematocrit: 31.7 % — ABNORMAL LOW (ref 37.5–51.0)
Hemoglobin: 10.5 g/dL — ABNORMAL LOW (ref 13.0–17.7)
MCH: 27.6 pg (ref 26.6–33.0)
MCHC: 33.1 g/dL (ref 31.5–35.7)
MCV: 83 fL (ref 79–97)
Platelets: 392 10*3/uL (ref 150–450)
RBC: 3.81 x10E6/uL — ABNORMAL LOW (ref 4.14–5.80)
RDW: 13.3 % (ref 11.6–15.4)
WBC: 6.3 10*3/uL (ref 3.4–10.8)

## 2022-04-19 NOTE — Patient Instructions (Signed)
Medication Instructions:  Your physician recommends that you continue on your current medications as directed. Please refer to the Current Medication list given to you today.   *If you need a refill on your cardiac medications before your next appointment, please call your pharmacy*   Lab Work: Your physician recommends that you complete labs today and complete labs in 4 weeks CBC(Today) Fasting Lipid & LFTs Panel (4 weeks) If you have labs (blood work) drawn today and your tests are completely normal, you will receive your results only by: MyChart Message (if you have MyChart) OR A paper copy in the mail If you have any lab test that is abnormal or we need to change your treatment, we will call you to review the results.   Testing/Procedures: NONE ordered at this time of appointment     Follow-Up: At [facility], you and your health needs are our priority.  As part of our continuing mission to provide you with exceptional heart care, we have created designated Provider Care Teams.  These Care Teams include your primary Cardiologist (physician) and Advanced Practice Providers (APPs -  Physician Assistants and Nurse Practitioners) who all work together to provide you with the care you need, when you need it.  We recommend signing up for the patient portal called "MyChart".  Sign up information is provided on this After Visit Summary.  MyChart is used to connect with patients for Virtual Visits (Telemedicine).  Patients are able to view lab/test results, encounter notes, upcoming appointments, etc.  Non-urgent messages can be sent to your provider as well.   To learn more about what you can do with MyChart, go to [email or web address].    Your next appointment:    Keep 06/13/2022  The format for your next appointment:   In Person  Provider:   Thomasene Ripple, DO     Other Instructions   Important Information About Sugar

## 2022-04-21 ENCOUNTER — Telehealth: Payer: Self-pay

## 2022-04-21 NOTE — Telephone Encounter (Signed)
Lmom, waiting on a return call to discuss lab results.  

## 2022-04-21 NOTE — Telephone Encounter (Signed)
FMLA completed for wife, Michell Heinrich. Faxed completed form to fax number provided. Original mailed to patient's home address.

## 2022-04-27 ENCOUNTER — Encounter: Payer: Self-pay | Admitting: Surgery

## 2022-04-29 NOTE — Telephone Encounter (Signed)
Spoke with pt. Pt was notified of lab results and recommendations. Pt will continue his current medication and follow up as planned.  

## 2022-05-02 ENCOUNTER — Other Ambulatory Visit: Payer: Self-pay | Admitting: Surgery

## 2022-05-02 DIAGNOSIS — Z951 Presence of aortocoronary bypass graft: Secondary | ICD-10-CM

## 2022-05-03 NOTE — Progress Notes (Unsigned)
      [address]       [name] [address]             [phone]     HPI: Patient returns for routine postoperative follow-up having undergone CABG x 5 on 03/28/2022. The patient's early postoperative recovery while in the hospital was without complication. Since hospital discharge the patient presented to the ED with complaints of indigestion after eating a meal of fried chicken and ensure.  Workup was negative and patient was ultimately discharged.  He presents today for post operative follow up.  Current Outpatient Medications  Medication Sig Dispense Refill   acetaminophen (TYLENOL) 500 MG tablet Take 1-2 tablets (500-1,000 mg total) by mouth every 6 (six) hours as needed. 30 tablet 0   amLODipine (NORVASC) 10 MG tablet Take 10 mg by mouth daily.     aspirin EC 81 MG tablet Take 1 tablet (81 mg total) by mouth daily. Swallow whole. 150 tablet 2   atorvastatin (LIPITOR) 20 MG tablet Take 1 tablet (20 mg total) by mouth daily. 30 tablet 3   Cyanocobalamin (VITAMIN B-12 PO) Take 1 tablet by mouth daily.     cyclobenzaprine (FLEXERIL) 10 MG tablet Take 1 tablet (10 mg total) by mouth 2 (two) times daily as needed for muscle spasms. 20 tablet 0   metoprolol succinate (TOPROL XL) 25 MG 24 hr tablet Take 1 tablet (25 mg total) by mouth daily. 30 tablet 11   oxymetazoline (AFRIN) 0.05 % nasal spray Place 1 spray into both nostrils 2 (two) times daily as needed for congestion.     traMADol (ULTRAM) 50 MG tablet Take 1-2 tablets (50-100 mg total) by mouth every 4 (four) hours as needed for moderate pain. 30 tablet 0   traZODone (DESYREL) 100 MG tablet Take 50-100 mg by mouth at bedtime.     Turmeric 500 MG TABS Take 500 mg by mouth daily.     valsartan-hydrochlorothiazide (DIOVAN-HCT) 80-12.5 MG tablet Take 1 tablet by mouth daily.     VITAMIN D PO Take 1 capsule by mouth daily.     Current Facility-Administered Medications  Medication Dose Route Frequency Provider Last Rate  Last Admin   nitroGLYCERIN (NITROSTAT) SL tablet 0.4 mg  0.4 mg Sublingual Q5 min PRN Lyn Records, MD        Physical Exam: ***  Diagnostic Tests: ***   A/P:  S/P CABG x 4 performed 03/28/2022- on Norvasc, Lopressor, Diovan/HCTZ ED visit for indigestion, fortunately workup was negative, I have included information on a heart healthy diet Nicotine abuse- has successfully quit smoking Cardiac rehabilitation-patient is cleared to participate if he wishes to participate Activity- continue to increase ambulation time/distance as tolerated, okay to resume driving if you are no longer taking narcotic pain medication, continue to observe sternal precautions as discussed RTC prn   Lowella Dandy, PA-C [facility] [phone]

## 2022-05-03 NOTE — Patient Instructions (Signed)
You are encouraged to enroll and participate in the outpatient cardiac rehab program beginning as soon as practical.  Do not resume smoking cigarettes or any other tobacco products.   Make every effort to maintain a "heart-healthy" lifestyle with regular physical exercise and adherence to a low-fat, low-carbohydrate diet.  Continue to seek regular follow-up appointments with your primary care physician and/or cardiologist.   You may return to driving an automobile as long as you are no longer requiring oral narcotic pain relievers during the daytime.  It would be wise to start driving only short distances during the daylight and gradually increase from there as you feel comfortable.   You may continue to gradually increase your physical activity as tolerated.  Refrain from any heavy lifting or strenuous use of your arms and shoulders until at least 8 weeks from the time of your surgery, and avoid activities that cause increased pain in your chest on the side of your surgical incision.  Otherwise you may continue to increase activities without any particular limitations.  Increase the intensity and duration of physical activity gradually.

## 2022-05-04 ENCOUNTER — Ambulatory Visit (INDEPENDENT_AMBULATORY_CARE_PROVIDER_SITE_OTHER): Payer: Self-pay | Admitting: Physician Assistant

## 2022-05-04 ENCOUNTER — Ambulatory Visit: Payer: 59 | Admitting: Surgery

## 2022-05-04 ENCOUNTER — Ambulatory Visit
Admission: RE | Admit: 2022-05-04 | Discharge: 2022-05-04 | Disposition: A | Discharge status: Home / Self Care | Payer: No Typology Code available for payment source | Source: Ambulatory Visit | Attending: Surgery | Admitting: Surgery

## 2022-05-04 VITALS — BP 111/70 | HR 58 | Resp 20 | Ht 69.0 in | Wt 178.0 lb

## 2022-05-04 DIAGNOSIS — Z951 Presence of aortocoronary bypass graft: Secondary | ICD-10-CM

## 2022-05-05 ENCOUNTER — Other Ambulatory Visit: Payer: Self-pay

## 2022-05-05 NOTE — Progress Notes (Signed)
Referral placed for Outpatient Cardiac Rehab at [facility]. Patient seen in the office 05/04/22, cleared by Lowella Dandy, PA to start Cardiac Rehab.

## 2022-05-09 ENCOUNTER — Encounter (HOSPITAL_COMMUNITY): Payer: Self-pay

## 2022-05-19 ENCOUNTER — Telehealth: Payer: Self-pay | Admitting: Cardiology

## 2022-05-19 NOTE — Telephone Encounter (Signed)
Spoke to patient advised Dr.Tobb is out of office this week.I will send message to Dr.Tobb's RN.

## 2022-05-19 NOTE — Telephone Encounter (Signed)
Pt is requesting a handicap sticker and would like to know if he can come by the office to get one. Please advise.

## 2022-05-26 ENCOUNTER — Telehealth (HOSPITAL_COMMUNITY): Payer: Self-pay

## 2022-05-26 LAB — HEPATIC FUNCTION PANEL
ALT: 15 IU/L (ref 0–44)
AST: 16 IU/L (ref 0–40)
Albumin: 4.3 g/dL (ref 3.8–4.8)
Alkaline Phosphatase: 74 IU/L (ref 44–121)
Bilirubin Total: 0.3 mg/dL (ref 0.0–1.2)
Bilirubin, Direct: <0.1 mg/dL (ref 0.00–0.40)
Total Protein: 6.8 g/dL (ref 6.0–8.5)

## 2022-05-26 NOTE — Telephone Encounter (Signed)
Called pt to relay Dr. Mallory Shirk message. He states "I already got it handled." No further questions at this time.

## 2022-05-31 ENCOUNTER — Encounter (HOSPITAL_COMMUNITY): Payer: Self-pay

## 2022-05-31 ENCOUNTER — Encounter (HOSPITAL_COMMUNITY)
Admission: RE | Admit: 2022-05-31 | Discharge: 2022-05-31 | Disposition: A | Discharge status: Home / Self Care | Payer: 59 | Source: Ambulatory Visit | Attending: Cardiology | Admitting: Cardiology

## 2022-05-31 VITALS — BP 101/68 | HR 66 | Ht 69.0 in | Wt 181.4 lb

## 2022-05-31 DIAGNOSIS — Z48812 Encounter for surgical aftercare following surgery on the circulatory system: Secondary | ICD-10-CM | POA: Diagnosis not present

## 2022-05-31 DIAGNOSIS — Z951 Presence of aortocoronary bypass graft: Secondary | ICD-10-CM | POA: Insufficient documentation

## 2022-05-31 LAB — LIPID PANEL
Chol/HDL Ratio: 3.3 ratio (ref 0.0–5.0)
Cholesterol, Total: 139 mg/dL (ref 100–199)
HDL: 42 mg/dL (ref >39)
LDL Chol Calc (NIH): 77 mg/dL (ref 0–99)
Triglycerides: 110 mg/dL (ref 0–149)
VLDL Cholesterol Cal: 20 mg/dL (ref 5–40)

## 2022-05-31 NOTE — Progress Notes (Signed)
Cardiac Rehab Medication Review by a Nurse  Does the patient  feel that his/her medications are working for him/her?  yes  Has the patient been experiencing any side effects to the medications prescribed?  no  Does the patient measure his/her own blood pressure or blood glucose at home?  no   Does the patient have any problems obtaining medications due to transportation or finances?   no  Understanding of regimen: good Understanding of indications: good Potential of compliance: good    Nurse  comments: John Delacruz is taking his medications as prescribed and has a good understanding of what his medications are for. John Delacruz has a BP cuff/monitor. John Delacruz does not check his blood pressures on a daily basis    Thayer Headings RN 05/31/2022 11:29 AM

## 2022-05-31 NOTE — Progress Notes (Signed)
Cardiac Individual Treatment Plan  Patient Details  Name: John Delacruz MRN: [id number] Date of Birth: 1961/01/23 Referring Provider:   Flowsheet Row INTENSIVE CARDIAC REHAB ORIENT from 05/31/2022 in [facility] [place]  Referring Provider Berniece Salines, DO       Initial Encounter Date:  [place] from 05/31/2022 in [facility]  Date 05/31/22       Visit Diagnosis: 03/28/22 CABG x 4  Patient's Home Medications on Admission:  Current Outpatient Medications:    acetaminophen (TYLENOL) 500 MG tablet, Take 1-2 tablets (500-1,000 mg total) by mouth every 6 (six) hours as needed., Disp: 30 tablet, Rfl: 0   amLODipine (NORVASC) 10 MG tablet, Take 10 mg by mouth daily., Disp: , Rfl:    aspirin EC 81 MG tablet, Take 1 tablet (81 mg total) by mouth daily. Swallow whole., Disp: 150 tablet, Rfl: 2   atorvastatin (LIPITOR) 20 MG tablet, Take 1 tablet (20 mg total) by mouth daily., Disp: 30 tablet, Rfl: 3   bisacodyl (DULCOLAX) 5 MG EC tablet, Take 5 mg by mouth daily as needed for moderate constipation., Disp: , Rfl:    cyclobenzaprine (FLEXERIL) 10 MG tablet, Take 1 tablet (10 mg total) by mouth 2 (two) times daily as needed for muscle spasms., Disp: 20 tablet, Rfl: 0   metoprolol succinate (TOPROL XL) 25 MG 24 hr tablet, Take 1 tablet (25 mg total) by mouth daily., Disp: 30 tablet, Rfl: 11   nitroGLYCERIN (NITROSTAT) 0.4 MG SL tablet, Place 0.4 mg under the tongue every 5 (five) minutes as needed for chest pain., Disp: , Rfl:    oxymetazoline (AFRIN) 0.05 % nasal spray, Place 1 spray into both nostrils 2 (two) times daily as needed for congestion., Disp: , Rfl:    traZODone (DESYREL) 100 MG tablet, Take 50-100 mg by mouth at bedtime as needed for sleep., Disp: , Rfl:    valsartan-hydrochlorothiazide (DIOVAN-HCT) 80-12.5 MG tablet, Take 1 tablet by mouth daily., Disp: , Rfl:    VITAMIN D PO, Take 1 capsule by  mouth daily., Disp: , Rfl:    traMADol (ULTRAM) 50 MG tablet, Take 1-2 tablets (50-100 mg total) by mouth every 4 (four) hours as needed for moderate pain. (Patient not taking: Reported on 05/25/2022), Disp: 30 tablet, Rfl: 0  Current Facility-Administered Medications:    nitroGLYCERIN (NITROSTAT) SL tablet 0.4 mg, 0.4 mg, Sublingual, Q5 min PRN, Belva Crome, MD  Past Medical History: Past Medical History:  Diagnosis Date   Anginal pain ([place]) 02/2022   Coronary artery disease    Hypertension     Tobacco Use: Social History   Tobacco Use  Smoking Status Former   Packs/day: 1.00   Years: 35.00   Total pack years: 35.00   Types: Cigarettes   Quit date: 03/27/2022   Years since quitting: 0.1  Smokeless Tobacco Never  Tobacco Comments   Quit on 03/27/22 and has not resumed smoking    Labs: Review Flowsheet       Latest Ref Rng & Units 03/24/2022 03/28/2022 05/26/2022  Labs for ITP Cardiac and Pulmonary Rehab  Cholestrol 100 - 199 mg/dL - - 139   LDL (calc) 0 - 99 mg/dL - - 77   HDL-C >39 mg/dL - - 42   Trlycerides 0 - 149 mg/dL - - 110   Hemoglobin A1c 4.8 - 5.6 % 5.9  - -  PH, Arterial 7.35 - 7.45 7.44  7.360  7.344  7.383  7.480  7.417  7.343  7.369  -  PCO2 arterial 32 - 48 mmHg 38  43.2  42.9  38.8  36.4  39.9  44.5  51.7  -  Bicarbonate 20.0 - 28.0 mmol/L 25.8  24.3  23.4  23.6  27.1  25.8  24.2  29.8  -  TCO2 22 - 32 mmol/L - 26  25  25  24  28  28  25  27  25  27  31  27   -  Acid-base deficit 0.0 - 2.0 mmol/L - 1.0  2.0  2.0  2.0  -  O2 Saturation % 100  97  96  99  100  100  78  100  -    Capillary Blood Glucose: Lab Results  Component Value Date   GLUCAP 81 03/30/2022   GLUCAP 117 (H) 03/30/2022   GLUCAP 113 (H) 03/30/2022   GLUCAP 131 (H) 03/30/2022   GLUCAP 141 (H) 03/29/2022     Exercise Target Goals: Exercise Program Goal: Individual exercise prescription set using results from initial 6 min walk test and THRR while considering  patient's activity barriers  and safety.   Exercise Prescription Goal: Initial exercise prescription builds to 30-45 minutes a day of aerobic activity, 2-3 days per week.  Home exercise guidelines will be given to patient during program as part of exercise prescription that the participant will acknowledge.  Activity Barriers & Risk Stratification:  Activity Barriers & Cardiac Risk Stratification - 05/31/22 1027       Activity Barriers & Cardiac Risk Stratification   Activity Barriers Arthritis;Other (comment)    Comments Prior left wrist surgery, arthritis- back and knee.    Cardiac Risk Stratification Moderate             6 Minute Walk:  6 Minute Walk     Row Name 05/31/22 1011         6 Minute Walk   Phase Initial     Distance 1416 feet     Walk Time 6 minutes     # of Rest Breaks 0     MPH 2.68     METS 3.33     RPE 7     Perceived Dyspnea  0     VO2 Peak 11.66     Symptoms No     Resting HR 66 bpm     Resting BP 101/68     Resting Oxygen Saturation  98 %     Exercise Oxygen Saturation  during 6 min walk 98 %     Max Ex. HR 74 bpm     Max Ex. BP 125/77     2 Minute Post BP 109/70              Oxygen Initial Assessment:   Oxygen Re-Evaluation:   Oxygen Discharge (Final Oxygen Re-Evaluation):   Initial Exercise Prescription:  Initial Exercise Prescription - 05/31/22 1000       Date of Initial Exercise RX and Referring Provider   Date 05/31/22    Referring Provider Berniece Salines, DO    Expected Discharge Date 07/29/22      Recumbant Bike   Level 2    Watts 25    Minutes 15    METs 2.9      NuStep   Level 3    SPM 85    Minutes 15    METs 2.8      Prescription Details   Frequency (  times per week) 3    Duration Progress to 30 minutes of continuous aerobic without signs/symptoms of physical distress      Intensity   THRR 40-80% of Max Heartrate 63-126    Ratings of Perceived Exertion 11-13    Perceived Dyspnea 0-4      Progression   Progression Continue to  progress workloads to maintain intensity without signs/symptoms of physical distress.      Resistance Training   Training Prescription Yes    Weight 5 lbs    Reps 10-15             Perform Capillary Blood Glucose checks as needed.  Exercise Prescription Changes:   Exercise Comments:   Exercise Goals and Review:   Exercise Goals     Row Name 05/31/22 0951             Exercise Goals   Increase Physical Activity Yes       Intervention Provide advice, education, support and counseling about physical activity/exercise needs.;Develop an individualized exercise prescription for aerobic and resistive training based on initial evaluation findings, risk stratification, comorbidities and participant's personal goals.       Expected Outcomes Short Term: Attend rehab on a regular basis to increase amount of physical activity.;Long Term: Exercising regularly at least 3-5 days a week.;Long Term: Add in home exercise to make exercise part of routine and to increase amount of physical activity.       Increase Strength and Stamina Yes       Intervention Provide advice, education, support and counseling about physical activity/exercise needs.;Develop an individualized exercise prescription for aerobic and resistive training based on initial evaluation findings, risk stratification, comorbidities and participant's personal goals.       Expected Outcomes Short Term: Increase workloads from initial exercise prescription for resistance, speed, and METs.;Short Term: Perform resistance training exercises routinely during rehab and add in resistance training at home;Long Term: Improve cardiorespiratory fitness, muscular endurance and strength as measured by increased METs and functional capacity ( )       Able to understand and use rate of perceived exertion (RPE) scale Yes       Intervention Provide education and explanation on how to use RPE scale       Expected Outcomes Short Term: Able to use  RPE daily in rehab to express subjective intensity level;Long Term:  Able to use RPE to guide intensity level when exercising independently       Knowledge and understanding of Target Heart Rate Range (THRR) Yes       Intervention Provide education and explanation of THRR including how the numbers were predicted and where they are located for reference       Expected Outcomes Short Term: Able to state/look up THRR;Long Term: Able to use THRR to govern intensity when exercising independently;Short Term: Able to use daily as guideline for intensity in rehab       Able to check pulse independently Yes       Intervention Provide education and demonstration on how to check pulse in carotid and radial arteries.;Review the importance of being able to check your own pulse for safety during independent exercise       Expected Outcomes Short Term: Able to explain why pulse checking is important during independent exercise;Long Term: Able to check pulse independently and accurately       Understanding of Exercise Prescription Yes       Intervention Provide education, explanation, and written materials on patient's  individual exercise prescription       Expected Outcomes Short Term: Able to explain program exercise prescription;Long Term: Able to explain home exercise prescription to exercise independently                Exercise Goals Re-Evaluation :   Discharge Exercise Prescription (Final Exercise Prescription Changes):   Nutrition:  Target Goals: Understanding of nutrition guidelines, daily intake of sodium 1500mg , cholesterol 200mg , calories 30% from fat and 7% or less from saturated fats, daily to have 5 or more servings of fruits and vegetables.  Biometrics:  Pre Biometrics - 05/31/22 0943       Pre Biometrics   Waist Circumference 39.25 inches    Hip Circumference 40.75 inches    Waist to Hip Ratio 0.96 %    Triceps Skinfold 17 mm    % Body Fat 27.1 %    Grip Strength 52 kg     Flexibility 0 in   Knees bent   Single Leg Stand 30 seconds              Nutrition Therapy Plan and Nutrition Goals:   Nutrition Assessments:  MEDIFICTS Score Key: ?70 Need to make dietary changes  40-70 Heart Healthy Diet ? 40 Therapeutic Level Cholesterol Diet    Picture Your Plate Scores: [id number] Unhealthy dietary pattern with much room for improvement. 41-50 Dietary pattern unlikely to meet recommendations for good health and room for improvement. 51-60 More healthful dietary pattern, with some room for improvement.  >60 Healthy dietary pattern, although there may be some specific behaviors that could be improved.    Nutrition Goals Re-Evaluation:   Nutrition Goals Re-Evaluation:   Nutrition Goals Discharge (Final Nutrition Goals Re-Evaluation):   Psychosocial: Target Goals: Acknowledge presence or absence of significant depression and/or stress, maximize coping skills, provide positive support system. Participant is able to verbalize types and ability to use techniques and skills needed for reducing stress and depression.  Initial Review & Psychosocial Screening:  Initial Psych Review & Screening - 05/31/22 1201       Initial Review   Current issues with None Identified      Family Dynamics   Good Support System? Yes   [name] has his spouse for support     Screening Interventions   Interventions Encouraged to exercise             Quality of Life Scores:  Quality of Life - 05/31/22 1118       Quality of Life   Select Quality of Life      Quality of Life Scores   Health/Function Pre 22.57 %    Socioeconomic Pre 22.29 %    Psych/Spiritual Pre 24.64 %    Family Pre 26.1 %    GLOBAL Pre 23.46 %            Scores of 19 and below usually indicate a poorer quality of life in these areas.  A difference of  2-3 points is a clinically meaningful difference.  A difference of 2-3 points in the total score of the Quality of Life Index has been associated  with significant improvement in overall quality of life, self-image, physical symptoms, and general health in studies assessing change in quality of life.  PHQ-9: Review Flowsheet       05/31/2022  Depression screen PHQ 2/9  Decreased Interest 0  Down, Depressed, Hopeless 0  PHQ - 2 Score 0   Interpretation of Total Score  Total Score Depression Severity:  1-4 = Minimal depression, 5-9 = Mild depression, 10-14 = Moderate depression, 15-19 = Moderately severe depression, 20-27 = Severe depression   Psychosocial Evaluation and Intervention:   Psychosocial Re-Evaluation:   Psychosocial Discharge (Final Psychosocial Re-Evaluation):   Vocational Rehabilitation: Provide vocational rehab assistance to qualifying candidates.   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 Participant will state understanding/return demonstration of topics presented.     Core Videos: Exercise    Move It!  Clinical staff conducted group or individual video education with verbal and written material and guidebook.  Patient learns the recommended Pritikin exercise program. Exercise with the goal of living a long, healthy life. Some of the health benefits of exercise include controlled diabetes, healthier blood pressure levels, improved cholesterol levels, improved heart and lung capacity, improved sleep, and better body composition. Everyone should speak with their doctor before starting or changing an exercise routine.  Biomechanical Limitations Clinical staff conducted group or individual video education with verbal and written material and guidebook.   Patient learns how biomechanical limitations can impact exercise and how we can mitigate and possibly overcome limitations to have an impactful and balanced exercise routine.  Body Composition Clinical staff conducted group or individual video education with verbal and written material and guidebook.  Patient learns that body composition (ratio of muscle mass to fat mass) is a key component to assessing overall fitness, rather than body weight alone. Increased fat mass, especially visceral belly fat, can put [place] at increased risk for metabolic syndrome, type 2 diabetes, heart disease, and even death. It is recommended to combine diet and exercise (cardiovascular and resistance training) to improve your body composition. Seek guidance from your physician and exercise physiologist before implementing an exercise routine.  Exercise Action Plan Clinical staff conducted group or individual video education with verbal and written material and guidebook.  Patient learns the recommended strategies to achieve and enjoy long-term exercise adherence, including variety, self-motivation, self-efficacy, and positive decision making. Benefits of exercise include fitness, good health, weight management, more energy, better sleep, less stress, and overall well-being.  Medical   Heart Disease Risk Reduction Clinical staff conducted group or individual video education with verbal and written material and guidebook.  Patient learns our heart is our most vital organ as it circulates oxygen, nutrients, white blood cells, and hormones throughout the entire body, and carries waste away. Data supports a plant-based eating plan like the Pritikin Program for its effectiveness in slowing progression of and reversing heart disease. The video provides a number of recommendations to address heart disease.   Metabolic Syndrome and Belly Fat  Clinical staff conducted group or individual video education with verbal and written  material and guidebook.  Patient learns what metabolic syndrome is, how it leads to heart disease, and how one can reverse it and keep it from coming back. You have metabolic syndrome if you have 3 of the following 5 criteria: abdominal obesity, high blood pressure, high triglycerides, low HDL cholesterol, and high blood sugar.  Hypertension and Heart Disease Clinical staff conducted group or individual video education with verbal and written material and guidebook.  Patient learns that high blood pressure, or hypertension, is very common in the [place]. Hypertension is largely due to excessive salt intake, but other important risk factors include being overweight, physical inactivity, drinking too much alcohol, smoking, and not eating enough potassium from fruits and vegetables. High blood pressure is a leading risk factor for heart attack, stroke, congestive heart failure, dementia, kidney failure, and premature death. Long-term effects of excessive salt intake include stiffening of the arteries and thickening of heart muscle and organ damage. Recommendations include ways to reduce hypertension and the risk of heart disease.  Diseases of Our Time - Focusing on Diabetes Clinical staff conducted group or individual video education with verbal and written material and guidebook.  Patient learns why the best way to stop diseases of our time is prevention, through food and other lifestyle changes. Medicine (such as prescription pills and surgeries) is often only a Band-Aid on the problem, not a long-term solution. Most common diseases of our time include obesity, type 2 diabetes, hypertension, heart disease, and cancer. The Pritikin Program is recommended and has been proven to help reduce, reverse, and/or prevent the damaging effects of metabolic syndrome.  Nutrition   Overview of the Pritikin Eating Plan  Clinical staff conducted group or individual video education with verbal and written material  and guidebook.  Patient learns about the Pritikin Eating Plan for disease risk reduction. The Pritikin Eating Plan emphasizes a wide variety of unrefined, minimally-processed carbohydrates, like fruits, vegetables, whole grains, and legumes. Go, Caution, and Stop food choices are explained. Plant-based and lean animal proteins are emphasized. Rationale provided for low sodium intake for blood pressure control, low added sugars for blood sugar stabilization, and low added fats and oils for coronary artery disease risk reduction and weight management.  Calorie Density  Clinical staff conducted group or individual video education with verbal and written material and guidebook.  Patient learns about calorie density and how it impacts the Pritikin Eating Plan. Knowing the characteristics of the food you choose will help you decide whether those foods will lead to weight gain or weight loss, and whether you want to consume more or less of them. Weight loss is usually a side effect of the Pritikin Eating Plan because of its focus on low calorie-dense foods.  Label Reading  Clinical staff conducted group or individual video education with verbal and written material and guidebook.  Patient learns about the Pritikin recommended label reading guidelines and corresponding recommendations regarding calorie density, added sugars, sodium content, and whole grains.  Dining Out - Part 1  Clinical staff conducted group or individual video education with verbal and written material and guidebook.  Patient learns that restaurant meals can be sabotaging because they can be so high in calories, fat, sodium, and/or sugar. Patient learns recommended strategies on how to positively address this and avoid unhealthy pitfalls.  Facts on Fats  Clinical staff conducted group or individual video education with verbal and written material and guidebook.  Patient learns that lifestyle modifications can be just as effective, if not  more so, as many medications for lowering your risk of heart disease. A Pritikin lifestyle can help to reduce your risk of inflammation and atherosclerosis (cholesterol build-up, or plaque, in the artery walls). Lifestyle interventions such as dietary choices and physical activity address the cause of atherosclerosis. A review of the types of fats and their impact on blood cholesterol levels, along with dietary recommendations to reduce fat intake is also included.  Nutrition Action  Plan  Clinical staff conducted group or individual video education with verbal and written material and guidebook.  Patient learns how to incorporate Pritikin recommendations into their lifestyle. Recommendations include planning and keeping personal health goals in mind as an important part of their success.  Healthy Mind-Set    Healthy Minds, Bodies, Hearts  Clinical staff conducted group or individual video education with verbal and written material and guidebook.  Patient learns how to identify when they are stressed. Video will discuss the impact of that stress, as well as the many benefits of stress management. Patient will also be introduced to stress management techniques. The way we think, act, and feel has an impact on our hearts.  How Our Thoughts Can Heal Our Hearts  Clinical staff conducted group or individual video education with verbal and written material and guidebook.  Patient learns that negative thoughts can cause depression and anxiety. This can result in negative lifestyle behavior and serious health problems. Cognitive behavioral therapy is an effective method to help control our thoughts in order to change and improve our emotional outlook.  Additional Videos:  Exercise    Improving Performance  Clinical staff conducted group or individual video education with verbal and written material and guidebook.  Patient learns to use a non-linear approach by alternating intensity levels and lengths of  time spent exercising to help burn more calories and lose more body fat. Cardiovascular exercise helps improve heart health, metabolism, hormonal balance, blood sugar control, and recovery from fatigue. Resistance training improves strength, endurance, balance, coordination, reaction time, metabolism, and muscle mass. Flexibility exercise improves circulation, posture, and balance. Seek guidance from your physician and exercise physiologist before implementing an exercise routine and learn your capabilities and proper form for all exercise.  Introduction to Yoga  Clinical staff conducted group or individual video education with verbal and written material and guidebook.  Patient learns about yoga, a discipline of the coming together of mind, breath, and body. The benefits of yoga include improved flexibility, improved range of motion, better posture and core strength, increased lung function, weight loss, and positive self-image. Yoga's heart health benefits include lowered blood pressure, healthier heart rate, decreased cholesterol and triglyceride levels, improved immune function, and reduced stress. Seek guidance from your physician and exercise physiologist before implementing an exercise routine and learn your capabilities and proper form for all exercise.  Medical   Aging: Enhancing Your Quality of Life  Clinical staff conducted group or individual video education with verbal and written material and guidebook.  Patient learns key strategies and recommendations to stay in good physical health and enhance quality of life, such as prevention strategies, having an advocate, securing a [place], and keeping a list of medications and system for tracking them. It also discusses how to avoid risk for bone loss.  Biology of Weight Control  Clinical staff conducted group or individual video education with verbal and written material and guidebook.  Patient learns that weight  gain occurs because we consume more calories than we burn (eating more, moving less). Even if your body weight is normal, you may have higher ratios of fat compared to muscle mass. Too much body fat puts you at increased risk for cardiovascular disease, heart attack, stroke, type 2 diabetes, and obesity-related cancers. In addition to exercise, following the [place] can help reduce your risk.  Decoding Lab Results  Clinical staff conducted group or individual video education with verbal and written material and  guidebook.  Patient learns that lab test reflects one measurement whose values change over time and are influenced by many factors, including medication, stress, sleep, exercise, food, hydration, pre-existing medical conditions, and more. It is recommended to use the knowledge from this video to become more involved with your lab results and evaluate your numbers to speak with your doctor.   Diseases of Our Time - Overview  Clinical staff conducted group or individual video education with verbal and written material and guidebook.  Patient learns that according to the CDC, 50% to 70% of chronic diseases (such as obesity, type 2 diabetes, elevated lipids, hypertension, and heart disease) are avoidable through lifestyle improvements including healthier food choices, listening to satiety cues, and increased physical activity.  Sleep Disorders Clinical staff conducted group or individual video education with verbal and written material and guidebook.  Patient learns how good quality and duration of sleep are important to overall health and well-being. Patient also learns about sleep disorders and how they impact health along with recommendations to address them, including discussing with a physician.  Nutrition  Dining Out - Part 2 Clinical staff conducted group or individual video education with verbal and written material and guidebook.  Patient learns how to plan ahead and  communicate in order to maximize their dining experience in a healthy and nutritious manner. Included are recommended food choices based on the type of restaurant the patient is visiting.   Fueling a Best boy conducted group or individual video education with verbal and written material and guidebook.  There is a strong connection between our food choices and our health. Diseases like obesity and type 2 diabetes are very prevalent and are in large-part due to lifestyle choices. The Pritikin Eating Plan provides plenty of food and hunger-curbing satisfaction. It is easy to follow, affordable, and helps reduce health risks.  Menu Workshop  Clinical staff conducted group or individual video education with verbal and written material and guidebook.  Patient learns that restaurant meals can sabotage health goals because they are often packed with calories, fat, sodium, and sugar. Recommendations include strategies to plan ahead and to communicate with the manager, chef, or server to help order a healthier meal.  Planning Your Eating Strategy  Clinical staff conducted group or individual video education with verbal and written material and guidebook.  Patient learns about the [place] and its benefit of reducing the risk of disease. The [place] does not focus on calories. Instead, it emphasizes high-quality, nutrient-rich foods. By knowing the characteristics of the foods, we choose, we can determine their calorie density and make informed decisions.  Targeting Your Nutrition Priorities  Clinical staff conducted group or individual video education with verbal and written material and guidebook.  Patient learns that lifestyle habits have a tremendous impact on disease risk and progression. This video provides eating and physical activity recommendations based on your personal health goals, such as reducing LDL cholesterol, losing weight, preventing or controlling  type 2 diabetes, and reducing high blood pressure.  Vitamins and Minerals  Clinical staff conducted group or individual video education with verbal and written material and guidebook.  Patient learns different ways to obtain key vitamins and minerals, including through a recommended healthy diet. It is important to discuss all supplements you take with your doctor.   Healthy Mind-Set    Smoking Cessation  Clinical staff conducted group or individual video education with verbal and written material and guidebook.  Patient learns that  cigarette smoking and tobacco addiction pose a serious health risk which affects millions of people. Stopping smoking will significantly reduce the risk of heart disease, lung disease, and many forms of cancer. Recommended strategies for quitting are covered, including working with your doctor to develop a successful plan.  Culinary   Becoming a Financial trader conducted group or individual video education with verbal and written material and guidebook.  Patient learns that cooking at home can be healthy, cost-effective, quick, and puts them in control. Keys to cooking healthy recipes will include looking at your recipe, assessing your equipment needs, planning ahead, making it simple, choosing cost-effective seasonal ingredients, and limiting the use of added fats, salts, and sugars.  Cooking - Breakfast and Snacks  Clinical staff conducted group or individual video education with verbal and written material and guidebook.  Patient learns how important breakfast is to satiety and nutrition through the entire day. Recommendations include key foods to eat during breakfast to help stabilize blood sugar levels and to prevent overeating at meals later in the day. Planning ahead is also a key component.  Cooking - Human resources officer conducted group or individual video education with verbal and written material and guidebook.  Patient learns  eating strategies to improve overall health, including an approach to cook more at home. Recommendations include thinking of animal protein as a side on your plate rather than center stage and focusing instead on lower calorie dense options like vegetables, fruits, whole grains, and plant-based proteins, such as beans. Making sauces in large quantities to freeze for later and leaving the skin on your vegetables are also recommended to maximize your experience.  Cooking - Healthy Salads and Dressing Clinical staff conducted group or individual video education with verbal and written material and guidebook.  Patient learns that vegetables, fruits, whole grains, and legumes are the foundations of the [place]. Recommendations include how to incorporate each of these in flavorful and healthy salads, and how to create homemade salad dressings. Proper handling of ingredients is also covered. Cooking - Soups and [employer] - Soups and Desserts Clinical staff conducted group or individual video education with verbal and written material and guidebook.  Patient learns that Pritikin soups and desserts make for easy, nutritious, and delicious snacks and meal components that are low in sodium, fat, sugar, and calorie density, while high in vitamins, minerals, and filling fiber. Recommendations include simple and healthy ideas for soups and desserts.   Overview     The Pritikin Solution Program Overview Clinical staff conducted group or individual video education with verbal and written material and guidebook.  Patient learns that the results of the [place] Program have been documented in more than 100 articles published in peer-reviewed journals, and the benefits include reducing risk factors for (and, in some cases, even reversing) high cholesterol, high blood pressure, type 2 diabetes, obesity, and more! An overview of the three key pillars of the Pritikin Program will be covered: eating well,  doing regular exercise, and having a healthy mind-set.  WORKSHOPS  Exercise: Exercise Basics: Building Your Action Plan Clinical staff led group instruction and group discussion with PowerPoint presentation and patient guidebook. To enhance the learning environment the use of posters, models and videos may be added. At the conclusion of this workshop, patients will comprehend the difference between physical activity and exercise, as well as the benefits of incorporating both, into their routine. Patients will understand the FITT (Frequency, Intensity,  Time, and Type) principle and how to use it to build an exercise action plan. In addition, safety concerns and other considerations for exercise and cardiac rehab will be addressed by the presenter. The purpose of this lesson is to promote a comprehensive and effective weekly exercise routine in order to improve patients' overall level of fitness.   Managing Heart Disease: Your Path to a Healthier Heart Clinical staff led group instruction and group discussion with PowerPoint presentation and patient guidebook. To enhance the learning environment the use of posters, models and videos may be added.At the conclusion of this workshop, patients will understand the anatomy and physiology of the heart. Additionally, they will understand how Pritikin's three pillars impact the risk factors, the progression, and the management of heart disease.  The purpose of this lesson is to provide a high-level overview of the heart, heart disease, and how the Pritikin lifestyle positively impacts risk factors.  Exercise Biomechanics Clinical staff led group instruction and group discussion with PowerPoint presentation and patient guidebook. To enhance the learning environment the use of posters, models and videos may be added. Patients will learn how the structural parts of their bodies function and how these functions impact their daily activities, movement, and  exercise. Patients will learn how to promote a neutral spine, learn how to manage pain, and identify ways to improve their physical movement in order to promote healthy living. The purpose of this lesson is to expose patients to common physical limitations that impact physical activity. Participants will learn practical ways to adapt and manage aches and pains, and to minimize their effect on regular exercise. Patients will learn how to maintain good posture while sitting, walking, and lifting.  Balance Training and Fall Prevention  Clinical staff led group instruction and group discussion with PowerPoint presentation and patient guidebook. To enhance the learning environment the use of posters, models and videos may be added. At the conclusion of this workshop, patients will understand the importance of their sensorimotor skills (vision, proprioception, and the vestibular system) in maintaining their ability to balance as they age. Patients will apply a variety of balancing exercises that are appropriate for their current level of function. Patients will understand the common causes for poor balance, possible solutions to these problems, and ways to modify their physical environment in order to minimize their fall risk. The purpose of this lesson is to teach patients about the importance of maintaining balance as they age and ways to minimize their risk of falling.  WORKSHOPS   Nutrition:  Fueling a Scientist, research (physical sciences) led group instruction and group discussion with PowerPoint presentation and patient guidebook. To enhance the learning environment the use of posters, models and videos may be added. Patients will review the foundational principles of the [place] and understand what constitutes a serving size in each of the food groups. Patients will also learn Pritikin-friendly foods that are better choices when away from home and review make-ahead meal and snack options.  Calorie density will be reviewed and applied to three nutrition priorities: weight maintenance, weight loss, and weight gain. The purpose of this lesson is to reinforce (in a group setting) the key concepts around what patients are recommended to eat and how to apply these guidelines when away from home by planning and selecting Pritikin-friendly options. Patients will understand how calorie density may be adjusted for different weight management goals.  Mindful Eating  Clinical staff led group instruction and group discussion with PowerPoint presentation and patient  guidebook. To enhance the learning environment the use of posters, models and videos may be added. Patients will briefly review the concepts of the Pritikin Eating Plan and the importance of low-calorie dense foods. The concept of mindful eating will be introduced as well as the importance of paying attention to internal hunger signals. Triggers for non-hunger eating and techniques for dealing with triggers will be explored. The purpose of this lesson is to provide patients with the opportunity to review the basic principles of the Pritikin Eating Plan, discuss the value of eating mindfully and how to measure internal cues of hunger and fullness using the Hunger Scale. Patients will also discuss reasons for non-hunger eating and learn strategies to use for controlling emotional eating.  Targeting Your Nutrition Priorities Clinical staff led group instruction and group discussion with PowerPoint presentation and patient guidebook. To enhance the learning environment the use of posters, models and videos may be added. Patients will learn how to determine their genetic susceptibility to disease by reviewing their family history. Patients will gain insight into the importance of diet as part of an overall healthy lifestyle in mitigating the impact of genetics and other environmental insults. The purpose of this lesson is to provide patients with the  opportunity to assess their personal nutrition priorities by looking at their family history, their own health history and current risk factors. Patients will also be able to discuss ways of prioritizing and modifying the Pritikin Eating Plan for their highest risk areas  Menu  Clinical staff led group instruction and group discussion with PowerPoint presentation and patient guidebook. To enhance the learning environment the use of posters, models and videos may be added. Using menus brought in from [employer], or printed from [employer], patients will apply the Pritikin dining out guidelines that were presented in the [employer] video. Patients will also be able to practice these guidelines in a variety of provided scenarios. The purpose of this lesson is to provide patients with the opportunity to practice hands-on learning of the Pritikin Dining Out guidelines with actual menus and practice scenarios.  Label Reading Clinical staff led group instruction and group discussion with PowerPoint presentation and patient guidebook. To enhance the learning environment the use of posters, models and videos may be added. Patients will review and discuss the Pritikin label reading guidelines presented in Pritikin's Label Reading Educational series video. Using fool labels brought in from local grocery stores and markets, patients will apply the label reading guidelines and determine if the packaged food meet the Pritikin guidelines. The purpose of this lesson is to provide patients with the opportunity to review, discuss, and practice hands-on learning of the Pritikin Label Reading guidelines with actual packaged food labels. Cooking School  Pritikin's [employer] are designed to teach patients ways to prepare quick, simple, and affordable recipes at home. The importance of nutrition's role in chronic disease risk reduction is reflected in its emphasis in the overall  Pritikin program. By learning how to prepare essential core Pritikin Eating Plan recipes, patients will increase control over what they eat; be able to customize the flavor of foods without the use of added salt, sugar, or fat; and improve the quality of the food they consume. By learning a set of core recipes which are easily assembled, quickly prepared, and affordable, patients are more likely to prepare more healthy foods at home. These workshops focus on convenient breakfasts, simple entres, side dishes, and desserts which can be prepared with  minimal effort and are consistent with nutrition recommendations for cardiovascular risk reduction. Cooking [employer] are taught by a Engineer, materials (RD) who has been trained by the [employer]. The chef or RD has a clear understanding of the importance of minimizing - if not completely eliminating - added fat, sugar, and sodium in recipes. Throughout the series of [place] Workshop sessions, patients will learn about healthy ingredients and efficient methods of cooking to build confidence in their capability to prepare    Cooking School weekly topics:  Adding Flavor- Sodium-Free  Fast and Healthy Breakfasts  Powerhouse Plant-Based Proteins  Satisfying Salads and Dressings  Simple Sides and Sauces  International Cuisine-Spotlight on the [place] Zones  Delicious Desserts  Savory Soups  Efficiency Cooking - Meals in a Snap  Tasty Appetizers and Snacks  Comforting Weekend Breakfasts  One-Pot Wonders   Fast Evening Meals  Easy [place] (Psychosocial): New Thoughts, New Behaviors Clinical staff led group instruction and group discussion with PowerPoint presentation and patient guidebook. To enhance the learning environment the use of posters, models and videos may be added. Patients will learn and practice techniques for developing effective health  and lifestyle goals. Patients will be able to effectively apply the goal setting process learned to develop at least one new personal goal.  The purpose of this lesson is to expose patients to a new skill set of behavior modification techniques such as techniques setting SMART goals, overcoming barriers, and achieving new thoughts and new behaviors.  Managing Moods and Relationships Clinical staff led group instruction and group discussion with PowerPoint presentation and patient guidebook. To enhance the learning environment the use of posters, models and videos may be added. Patients will learn how emotional and chronic stress factors can impact their health and relationships. They will learn healthy ways to manage their moods and utilize positive coping mechanisms. In addition, ICR patients will learn ways to improve communication skills. The purpose of this lesson is to expose patients to ways of understanding how one's mood and health are intimately connected. Developing a healthy outlook can help build positive relationships and connections with others. Patients will understand the importance of utilizing effective communication skills that include actively listening and being heard. They will learn and understand the importance of the "4 Cs" and especially Connections in fostering of a Healthy Mind-Set.  Healthy Sleep for a Healthy Heart Clinical staff led group instruction and group discussion with PowerPoint presentation and patient guidebook. To enhance the learning environment the use of posters, models and videos may be added. At the conclusion of this workshop, patients will be able to demonstrate knowledge of the importance of sleep to overall health, well-being, and quality of life. They will understand the symptoms of, and treatments for, common sleep disorders. Patients will also be able to identify daytime and nighttime behaviors which impact sleep, and they will be able to apply these tools  to help manage sleep-related challenges. The purpose of this lesson is to provide patients with a general overview of sleep and outline the importance of quality sleep. Patients will learn about a few of the most common sleep disorders. Patients will also be introduced to the concept of "sleep hygiene," and discover ways to self-manage certain sleeping problems through simple daily behavior changes. Finally, the workshop will motivate patients by clarifying the links between quality sleep and their goals of heart-healthy living.   Recognizing and  Reducing Stress Clinical staff led group instruction and group discussion with PowerPoint presentation and patient guidebook. To enhance the learning environment the use of posters, models and videos may be added. At the conclusion of this workshop, patients will be able to understand the types of stress reactions, differentiate between acute and chronic stress, and recognize the impact that chronic stress has on their health. They will also be able to apply different coping mechanisms, such as reframing negative self-talk. Patients will have the opportunity to practice a variety of stress management techniques, such as deep abdominal breathing, progressive muscle relaxation, and/or guided imagery.  The purpose of this lesson is to educate patients on the role of stress in their lives and to provide healthy techniques for coping with it.  Learning Barriers/Preferences:  Learning Barriers/Preferences - 05/31/22 1203       Learning Barriers/Preferences   Learning Barriers Sight   Wears glasses   Learning Preferences Audio;Skilled Demonstration;Verbal Instruction;Computer/Internet;Group Instruction;Video;Individual Instruction;Written Material;Pictoral             Education Topics:  Knowledge Questionnaire Score:  Knowledge Questionnaire Score - 05/31/22 1119       Knowledge Questionnaire Score   Pre Score 21/28             Core  Components/Risk Factors/Patient Goals at Admission:  Personal Goals and Risk Factors at Admission - 05/31/22 0951       Core Components/Risk Factors/Patient Goals on Admission   Tobacco Cessation Yes   Quit on 03/27/2022   Number of packs per day 1    Intervention Assist the participant in steps to quit. Provide individualized education and counseling about committing to Tobacco Cessation, relapse prevention, and pharmacological support that can be provided by physician.    Expected Outcomes Long Term: Complete abstinence from all tobacco products for at least 12 months from quit date.;Short Term: Will quit all tobacco product use, adhering to prevention of relapse plan.    Hypertension Yes    Intervention Provide education on lifestyle modifcations including regular physical activity/exercise, weight management, moderate sodium restriction and increased consumption of fresh fruit, vegetables, and low fat dairy, alcohol moderation, and smoking cessation.;Monitor prescription use compliance.    Expected Outcomes Long Term: Maintenance of blood pressure at goal levels.    Lipids Yes    Intervention Provide education and support for participant on nutrition & aerobic/resistive exercise along with prescribed medications to achieve LDL 70mg , HDL >40mg .    Expected Outcomes Short Term: Participant states understanding of desired cholesterol values and is compliant with medications prescribed. Participant is following exercise prescription and nutrition guidelines.;Long Term: Cholesterol controlled with medications as prescribed, with individualized exercise RX and with personalized nutrition plan. Value goals: LDL < 70mg , HDL > 40 mg.             Core Components/Risk Factors/Patient Goals Review:    Core Components/Risk Factors/Patient Goals at Discharge (Final Review):    ITP Comments:  ITP Comments     Row Name 05/31/22 0943           ITP Comments Medical Director- [name],  MD. Introduction to Pritikin Education/ [facility] Cardiac Rehab. Initial Pritikin Orientation Packet Reviewed with the patient                Comments: Participant attended orientation for the cardiac rehabilitation program on  05/31/2022  to perform initial intake and exercise walk test. Patient introduced to the [place] education and orientation packet was reviewed. Completed 6-minute walk  test, measurements, initial ITP, and exercise prescription. Vital signs stable. Telemetry-normal sinus rhythm, asymptomatic.   Service time was from 0943 to 1120. Sol Passer, MS, ACSM CEP

## 2022-06-02 ENCOUNTER — Telehealth: Payer: Self-pay

## 2022-06-02 NOTE — Telephone Encounter (Signed)
Left a detailed message with lab results. Pt can call back with any questions or concerns.

## 2022-06-06 ENCOUNTER — Telehealth: Payer: Self-pay | Admitting: Physician Assistant

## 2022-06-06 ENCOUNTER — Encounter (HOSPITAL_COMMUNITY)
Admission: RE | Admit: 2022-06-06 | Discharge: 2022-06-06 | Disposition: A | Discharge status: Home / Self Care | Payer: 59 | Source: Ambulatory Visit | Attending: Cardiology | Admitting: Cardiology

## 2022-06-06 DIAGNOSIS — Z951 Presence of aortocoronary bypass graft: Secondary | ICD-10-CM | POA: Diagnosis not present

## 2022-06-06 NOTE — Telephone Encounter (Addendum)
   Byrd Hesselbach RN with cardiac rehab, called to report today's BP trends and wanted provider review so message forwarded to inpatient team. Pre-exercise BP today was 112/58, subsequent BPs during class were 114/65 and 126/70. Post-exercise BP was 87/52, pulse 58. Patient was given water and recheck soon after sitting was 116/72, standing 122/78. He was asymptomatic with these readings. Abbott Pao to have patient pre-hydrate well prior to rehab going forward. Given isolated soft reading amongst otherwise excellently controlled numbers, will route to Dr. Servando Salina for review and advisement on any further med changes at this time.  Laurann [place], PA-C

## 2022-06-06 NOTE — Progress Notes (Signed)
Daily Session Note  Patient Details  Name: John Delacruz MRN: [id number] Date of Birth: 1960/12/06 Referring Provider:   Flowsheet Row INTENSIVE CARDIAC REHAB ORIENT from 05/31/2022 in [place]  Referring Provider John Salines, DO       Encounter Date: 06/06/2022  Check In:  Session Check In - 06/06/22 1038       Check-In   Supervising physician immediately available to respond to emergencies [facility] immediately available    Physician(s) John. Alfredia Delacruz    Location [facility]    Staff Present John Pall, RN, John Pretty, MS, ACSM CEP, Exercise Physiologist;John Delacruz BS, ACSM EP-C, Exercise Physiologist;Other;John Winona, MS, ACSM-CEP, CCRP, Exercise Physiologist    Virtual Visit No    Medication changes reported     No    Fall or balance concerns reported    No    Tobacco Cessation No Change    Current number of cigarettes/nicotine per day     0    Warm-up and Cool-down Performed as group-led instruction    Resistance Training Performed Yes    VAD Patient? No    PAD/SET Patient? No      Pain Assessment   Currently in Pain? No/denies    Pain Score 0-No pain    Multiple Pain Sites No             Capillary Blood Glucose: No results found for this or any previous visit (from the past 24 hour(s)).   Exercise Prescription Changes - 06/06/22 1029       Response to Exercise   Blood Pressure (Admit) 112/58    Blood Pressure (Exercise) 126/70    Blood Pressure (Exit) 87/52   116/72 recheck after water, asymptomatic   Heart Rate (Admit) 58 bpm    Heart Rate (Exercise) 68 bpm    Heart Rate (Exit) 63 bpm    Rating of Perceived Exertion (Exercise) 12    Symptoms None    Comments Off to a good start with exercise.    Duration Continue with 30 min of aerobic exercise without signs/symptoms of physical distress.    Intensity THRR unchanged      Progression   Progression Continue to progress  workloads to maintain intensity without signs/symptoms of physical distress.    Average METs 2.1      Resistance Training   Training Prescription Yes    Weight 5 lbs    Reps 10-15    Time 10 Minutes      Interval Training   Interval Training No      Recumbant Bike   Level 2    Watts 27    Minutes 15    METs 2.2      NuStep   Level 3    SPM 64    Minutes 15    METs 2             Social History   Tobacco Use  Smoking Status Former   Packs/day: 1.00   Years: 35.00   Total pack years: 35.00   Types: Cigarettes   Quit date: 03/27/2022   Years since quitting: 0.1  Smokeless Tobacco Never  Tobacco Comments   Quit on 03/27/22 and has not resumed smoking    Goals Met:  Exercise tolerated well No report of concerns or symptoms today Strength training completed today  Goals Unmet:  BP  Comments: John Delacruz started cardiac rehab today.  Pt tolerated light exercise without difficulty.Post exercise BP  87/52. Patient asymptomatic and was given water. Recheck BP 116/72 sitting. Standing BP 122/78, John Delacruz [facility] paged and notified about today's BP readings. John Delacruz said to telemetry-Sinus Rhythm, asymptomatic. John Delacruz said she will forward today's BP readings to John John Delacruz. John Delacruz office will call the patient if any changes need to be made.  Medication list reconciled. Pt denies barriers to medicaiton compliance.  PSYCHOSOCIAL ASSESSMENT:  PHQ-0. Pt exhibits positive coping skills, hopeful outlook with supportive family. No psychosocial needs identified at this time, no psychosocial interventions necessary.    Pt enjoys working out.   Pt oriented to exercise equipment and routine.    Understanding verbalized.John Gave RN BSN    John Delacruz is Medical Director for Cardiac Rehab at [facility].

## 2022-06-08 ENCOUNTER — Encounter (HOSPITAL_COMMUNITY)
Admission: RE | Admit: 2022-06-08 | Discharge: 2022-06-08 | Disposition: A | Discharge status: Home / Self Care | Payer: 59 | Source: Ambulatory Visit | Attending: Cardiology | Admitting: Cardiology

## 2022-06-08 DIAGNOSIS — Z951 Presence of aortocoronary bypass graft: Secondary | ICD-10-CM | POA: Diagnosis not present

## 2022-06-10 ENCOUNTER — Encounter (HOSPITAL_COMMUNITY)
Admission: RE | Admit: 2022-06-10 | Discharge: 2022-06-10 | Disposition: A | Discharge status: Home / Self Care | Payer: 59 | Source: Ambulatory Visit | Attending: Cardiology | Admitting: Cardiology

## 2022-06-10 DIAGNOSIS — Z951 Presence of aortocoronary bypass graft: Secondary | ICD-10-CM

## 2022-06-13 ENCOUNTER — Ambulatory Visit: Payer: 59 | Admitting: Cardiology

## 2022-06-13 ENCOUNTER — Encounter: Payer: Self-pay | Admitting: Cardiology

## 2022-06-13 ENCOUNTER — Encounter (HOSPITAL_COMMUNITY)
Admission: RE | Admit: 2022-06-13 | Discharge: 2022-06-13 | Disposition: A | Discharge status: Home / Self Care | Payer: 59 | Source: Ambulatory Visit | Attending: Cardiology | Admitting: Cardiology

## 2022-06-13 VITALS — BP 118/64 | HR 60 | Ht 69.0 in | Wt 186.2 lb

## 2022-06-13 DIAGNOSIS — Z951 Presence of aortocoronary bypass graft: Secondary | ICD-10-CM

## 2022-06-13 DIAGNOSIS — I2510 Atherosclerotic heart disease of native coronary artery without angina pectoris: Secondary | ICD-10-CM

## 2022-06-13 DIAGNOSIS — I10 Essential (primary) hypertension: Secondary | ICD-10-CM

## 2022-06-13 MED ORDER — METOPROLOL SUCCINATE ER 25 MG PO TB24: 12.5000 mg | ORAL_TABLET | Freq: Every day | ORAL | 3 refills | Status: DC

## 2022-06-13 MED ORDER — VALSARTAN 40 MG PO TABS: 40.0000 mg | ORAL_TABLET | Freq: Every day | ORAL | 3 refills | Status: DC

## 2022-06-13 MED ORDER — HYDROCHLOROTHIAZIDE 12.5 MG PO CAPS: 12.5000 mg | ORAL_CAPSULE | Freq: Every day | ORAL | 3 refills | Status: DC

## 2022-06-13 NOTE — Progress Notes (Signed)
Cardiology Office Note:    Date:  06/13/2022   ID:  John Delacruz, DOB March 19, 1961, MRN [id number]  PCP:  Mila Palmer, MD  Cardiologist:  Thomasene Ripple, DO  Electrophysiologist:  None   Referring MD: Mila Palmer, MD   " I am doing fine"   History of Present Illness:    John Delacruz is a 61 y.o. male with a hx of coronary artery disease status post CABG x4 with LIMA to LAD, SVG to OM1, SVG to OM 2, SVG to [facility] on Mar 28, 2022, his postop surgical stay was uncomplicated, hypertension, tobacco use, here today for follow-up visit.  Since I saw the patient he has underwent his coronary artery bypass grafting due to coronary artery disease.  He did see Bernadene Person, NP on Apr 19, 2022 as a posthospital follow-up.  During that visit no medication changes were made and he is here today for his follow-up with me.  He is with his wife today he tells me that at the rehab his blood pressure has been there is information also in the chart noted about this.  No lightheadedness or dizziness.  He offers no other complaints.  Past Medical History:  Diagnosis Date   Anginal pain (HCC) 02/2022   Coronary artery disease    Hypertension     Past Surgical History:  Procedure Laterality Date   CARDIAC CATHETERIZATION     CORONARY ARTERY BYPASS GRAFT N/A 03/28/2022   Procedure: CORONARY ARTERY BYPASS GRAFTING X4, USING LEFT INTERNAL MAMMARY ARTERY, LEFT AND RIGHT GREATER SAPHENOUS VEIN HARVESTED ENDOSCOPICALLY;  Surgeon: Alleen Borne, MD;  Location: [facility] OR;  Service: Open Heart Surgery;  Laterality: N/A;   FRACTURE SURGERY     HERNIA REPAIR     LEFT HEART CATH AND CORONARY ANGIOGRAPHY N/A 03/17/2022   Procedure: LEFT HEART CATH AND CORONARY ANGIOGRAPHY;  Surgeon: Lyn Records, MD;  Location: [facility] INVASIVE CV LAB;  Service: Cardiovascular;  Laterality: N/A;   TEE WITHOUT CARDIOVERSION N/A 03/28/2022   Procedure: TRANSESOPHAGEAL ECHOCARDIOGRAM (TEE);  Surgeon: Alleen Borne, MD;  [facility];  Service: Open Heart Surgery;  Laterality: N/A;    Current Medications: Current Meds  Medication Sig   acetaminophen (TYLENOL) 500 MG tablet Take 1-2 tablets (500-1,000 mg total) by mouth every 6 (six) hours as needed.   amLODipine (NORVASC) 10 MG tablet Take 10 mg by mouth daily.   aspirin EC 81 MG tablet Take 1 tablet (81 mg total) by mouth daily. Swallow whole.   atorvastatin (LIPITOR) 20 MG tablet Take 1 tablet (20 mg total) by mouth daily.   bisacodyl (DULCOLAX) 5 MG EC tablet Take 5 mg by mouth daily as needed for moderate constipation.   cyclobenzaprine (FLEXERIL) 10 MG tablet Take 1 tablet (10 mg total) by mouth 2 (two) times daily as needed for muscle spasms.   hydrochlorothiazide (MICROZIDE) 12.5 MG capsule Take 1 capsule (12.5 mg total) by mouth daily.   nitroGLYCERIN (NITROSTAT) 0.4 MG SL tablet Place 0.4 mg under the tongue every 5 (five) minutes as needed for chest pain.   oxymetazoline (AFRIN) 0.05 % nasal spray Place 1 spray into both nostrils 2 (two) times daily as needed for congestion.   valsartan (DIOVAN) 40 MG tablet Take 1 tablet (40 mg total) by mouth daily.   VITAMIN D PO Take 1 capsule by mouth daily.   [DISCONTINUED] metoprolol succinate (TOPROL XL) 25 MG 24 hr tablet Take 1 tablet (25 mg total) by mouth daily.   [  DISCONTINUED] valsartan-hydrochlorothiazide (DIOVAN-HCT) 80-12.5 MG tablet Take 1 tablet by mouth daily.   Current Facility-Administered Medications for the 06/13/22 encounter (Office Visit) with Thomasene Ripple, DO  Medication   nitroGLYCERIN (NITROSTAT) SL tablet 0.4 mg     Allergies:   Lisinopril   Social History   Socioeconomic History   Marital status: Married    Spouse name: Not on file   Number of children: Not on file   Years of education: 16   Highest education level: Bachelor's degree (e.g., BA, AB, BS)  Occupational History   Not on file  Tobacco Use   Smoking status: Former    Packs/day: 1.00    Years: 35.00    Total pack years:  35.00    Types: Cigarettes    Quit date: 03/27/2022    Years since quitting: 0.2   Smokeless tobacco: Never   Tobacco comments:    Quit on 03/27/22 and has not resumed smoking  Vaping Use   Vaping Use: Never used  Substance and Sexual Activity   Alcohol use: Not Currently    Alcohol/week: 3.0 - 4.0 standard drinks of alcohol    Types: 3 - 4 Cans of beer per week    Comment: not drinking currently   Drug use: No   Sexual activity: Yes  Other Topics Concern   Not on file  Social History Narrative   Not on file   Social Determinants of Health   Financial Resource Strain: Not on file  Food Insecurity: Not on file  Transportation Needs: Not on file  Physical Activity: Not on file  Stress: Not on file  Social Connections: Not on file     Family History: The patient's family history includes Hypertension in his mother; Kidney disease in his mother; Lupus in his sister.  ROS:   Review of Systems  Constitution: Negative for decreased appetite, fever and weight gain.  HENT: Negative for congestion, ear discharge, hoarse voice and sore throat.   Eyes: Negative for discharge, redness, vision loss in right eye and visual halos.  Cardiovascular: Negative for chest pain, dyspnea on exertion, leg swelling, orthopnea and palpitations.  Respiratory: Negative for cough, hemoptysis, shortness of breath and snoring.   Endocrine: Negative for heat intolerance and polyphagia.  Hematologic/Lymphatic: Negative for bleeding problem. Does not bruise/bleed easily.  Skin: Negative for flushing, nail changes, rash and suspicious lesions.  Musculoskeletal: Negative for arthritis, joint pain, muscle cramps, myalgias, neck pain and stiffness.  Gastrointestinal: Negative for abdominal pain, bowel incontinence, diarrhea and excessive appetite.  Genitourinary: Negative for decreased libido, genital sores and incomplete emptying.  Neurological: Negative for brief paralysis, focal weakness, headaches and loss  of balance.  Psychiatric/Behavioral: Negative for altered mental status, depression and suicidal ideas.  Allergic/Immunologic: Negative for HIV exposure and persistent infections.    EKGs/Labs/Other Studies Reviewed:    The following studies were reviewed today:   EKG:  None today  Recent Labs: 03/29/2022: Magnesium 2.4 03/31/2022: BUN 13; Creatinine, Ser 1.27; Potassium 4.3; Sodium 137 04/19/2022: Hemoglobin 10.5; Platelets 392 05/26/2022: ALT 15  Recent Lipid Panel    Component Value Date/Time   CHOL 139 05/26/2022 0924   TRIG 110 05/26/2022 0924   HDL 42 05/26/2022 0924   CHOLHDL 3.3 05/26/2022 0924   LDLCALC 77 05/26/2022 0924    Physical Exam:    VS:  BP 118/64   Pulse 60   Ht 5\' 9"  (1.753 m)   Wt 186 lb 3.2 oz (84.5 kg)   SpO2 99%  BMI 27.50 kg/m     Wt Readings from Last 3 Encounters:  06/13/22 186 lb 3.2 oz (84.5 kg)  05/31/22 181 lb 7 oz (82.3 kg)  05/04/22 178 lb (80.7 kg)     GEN: Well nourished, well developed in no acute distress HEENT: Normal NECK: No JVD; No carotid bruits LYMPHATICS: No lymphadenopathy CARDIAC: S1S2 noted,RRR, no murmurs, rubs, gallops RESPIRATORY:  Clear to auscultation without rales, wheezing or rhonchi  ABDOMEN: Soft, non-tender, non-distended, +bowel sounds, no guarding. EXTREMITIES: No edema, No cyanosis, no clubbing MUSCULOSKELETAL:  No deformity  SKIN: Warm and dry NEUROLOGIC:  Alert and oriented x 3, non-focal PSYCHIATRIC:  Normal affect, good insight  ASSESSMENT:    1. Primary hypertension   2. CAD in native artery   3. S/P CABG x 4    PLAN:     1.  Coronary artery disease-status post CABG x4 on May 8.  He does not have any anginal symptoms today.  We will continue the patient on his aspirin 81 mg along with his atorvastatin.  2.  His blood pressure has been on the marginal side and which drops when he is at cardiac rehab.  I am going to make some medication changes today I will cut back his Diovan to 40 mg daily  as well as his metoprolol succinate to 12.5 mg daily.  He will remain on the 12.5 on hydrochlorothiazide and his dose of amlodipine.  Eventually we will continue to titrate off until there is no drop in his systolic blood pressure.  3.  Hyperlipidemia-continue his current statin dose.  4.  He quit smoking since his event.  The patient is in agreement with the above plan. The patient left the office in stable condition.  The patient will follow up in 6 months or sooner if needed.   Medication Adjustments/Labs and Tests Ordered: Current medicines are reviewed at length with the patient today.  Concerns regarding medicines are outlined above.  No orders of the defined types were placed in this encounter.  Meds ordered this encounter  Medications   metoprolol succinate (TOPROL XL) 25 MG 24 hr tablet    Sig: Take 0.5 tablets (12.5 mg total) by mouth daily.    Dispense:  45 tablet    Refill:  3   valsartan (DIOVAN) 40 MG tablet    Sig: Take 1 tablet (40 mg total) by mouth daily.    Dispense:  90 tablet    Refill:  3   hydrochlorothiazide (MICROZIDE) 12.5 MG capsule    Sig: Take 1 capsule (12.5 mg total) by mouth daily.    Dispense:  90 capsule    Refill:  3    Patient Instructions  Medication Instructions:  Your physician has recommended you make the following change in your medication:  START: Valsartan 40mg  daily  START: Hydrochlorothiazide 12.5mg  daily  DECREASE: Metoprolol 12.5mg  daily   *If you need a refill on your cardiac medications before your next appointment, please call your pharmacy*   Lab Work: NONE If you have labs (blood work) drawn today and your tests are completely normal, you will receive your results only by: MyChart Message (if you have MyChart) OR A paper copy in the mail If you have any lab test that is abnormal or we need to change your treatment, we will call you to review the results.   Testing/Procedures: NONE   Follow-Up: At [facility],  you and your health needs are our priority.  As part of our  continuing mission to provide you with exceptional heart care, we have created designated Provider Care Teams.  These Care Teams include your primary Cardiologist (physician) and Advanced Practice Providers (APPs -  Physician Assistants and Nurse Practitioners) who all work together to provide you with the care you need, when you need it.  We recommend signing up for the patient portal called "MyChart".  Sign up information is provided on this After Visit Summary.  MyChart is used to connect with patients for Virtual Visits (Telemedicine).  Patients are able to view lab/test results, encounter notes, upcoming appointments, etc.  Non-urgent messages can be sent to your provider as well.   To learn more about what you can do with MyChart, go to [email or web address].    Your next appointment:   6 month(s)  The format for your next appointment:   In Person  Provider:   Thomasene Ripple, DO     Adopting a Healthy Lifestyle.  Know what a healthy weight is for you (roughly BMI <25) and aim to maintain this   Aim for 7+ servings of fruits and vegetables daily   65-80+ fluid ounces of water or unsweet tea for healthy kidneys   Limit to max 1 drink of alcohol per day; avoid smoking/tobacco   Limit animal fats in diet for cholesterol and heart health - choose grass fed whenever available   Avoid highly processed foods, and foods high in saturated/trans fats   Aim for low stress - take time to unwind and care for your mental health   Aim for 150 min of moderate intensity exercise weekly for heart health, and weights twice weekly for bone health   Aim for 7-9 hours of sleep daily   When it comes to diets, agreement about the perfect plan isnt easy to find, even among the experts. Experts at the [facility] [employer] developed an idea known as the Healthy Eating Plate. Just imagine a plate divided into logical, healthy  portions.   The emphasis is on diet quality:   Load up on vegetables and fruits - one-half of your plate: Aim for color and variety, and remember that potatoes dont count.   Go for whole grains - one-quarter of your plate: Whole wheat, barley, wheat berries, quinoa, oats, brown rice, and foods made with them. If you want pasta, go with whole wheat pasta.   Protein power - one-quarter of your plate: Fish, chicken, beans, and nuts are all healthy, versatile protein sources. Limit red meat.   The diet, however, does go beyond the plate, offering a few other suggestions.   Use healthy plant oils, such as olive, canola, soy, corn, sunflower and peanut. Check the labels, and avoid partially hydrogenated oil, which have unhealthy trans fats.   If youre thirsty, drink water. Coffee and tea are good in moderation, but skip sugary drinks and limit milk and dairy products to one or two daily servings.   The type of carbohydrate in the diet is more important than the amount. Some sources of carbohydrates, such as vegetables, fruits, whole grains, and beans-are healthier than others.   Finally, stay active  Signed, Thomasene Ripple, DO  06/13/2022 8:23 PM    [facility]

## 2022-06-13 NOTE — Patient Instructions (Addendum)
Medication Instructions:  Your physician has recommended you make the following change in your medication:  START: Valsartan 40mg  daily  START: Hydrochlorothiazide 12.5mg  daily  DECREASE: Metoprolol 12.5mg  daily   *If you need a refill on your cardiac medications before your next appointment, please call your pharmacy*   Lab Work: NONE If you have labs (blood work) drawn today and your tests are completely normal, you will receive your results only by: MyChart Message (if you have MyChart) OR A paper copy in the mail If you have any lab test that is abnormal or we need to change your treatment, we will call you to review the results.   Testing/Procedures: NONE   Follow-Up: At [facility], you and your health needs are our priority.  As part of our continuing mission to provide you with exceptional heart care, we have created designated Provider Care Teams.  These Care Teams include your primary Cardiologist (physician) and Advanced Practice Providers (APPs -  Physician Assistants and Nurse Practitioners) who all work together to provide you with the care you need, when you need it.  We recommend signing up for the patient portal called "MyChart".  Sign up information is provided on this After Visit Summary.  MyChart is used to connect with patients for Virtual Visits (Telemedicine).  Patients are able to view lab/test results, encounter notes, upcoming appointments, etc.  Non-urgent messages can be sent to your provider as well.   To learn more about what you can do with MyChart, go to [facility].    Your next appointment:   6 month(s)  The format for your next appointment:   In Person  Provider:   [email or web address], DO

## 2022-06-14 NOTE — Progress Notes (Signed)
Cardiac Individual Treatment Plan  Patient Details  Name: John Delacruz MRN: [id number] Date of Birth: Apr 20, 1961 Referring Provider:   Flowsheet Row INTENSIVE CARDIAC REHAB ORIENT from 05/31/2022 in [facility]  Referring Provider Berniece Salines, DO       Initial Encounter Date:  [place] from 05/31/2022 in [facility]  Date 05/31/22       Visit Diagnosis: 03/28/22 CABG x 4  Patient's Home Medications on Admission:  Current Outpatient Medications:    acetaminophen (TYLENOL) 500 MG tablet, Take 1-2 tablets (500-1,000 mg total) by mouth every 6 (six) hours as needed., Disp: 30 tablet, Rfl: 0   amLODipine (NORVASC) 10 MG tablet, Take 10 mg by mouth daily., Disp: , Rfl:    aspirin EC 81 MG tablet, Take 1 tablet (81 mg total) by mouth daily. Swallow whole., Disp: 150 tablet, Rfl: 2   atorvastatin (LIPITOR) 20 MG tablet, Take 1 tablet (20 mg total) by mouth daily., Disp: 30 tablet, Rfl: 3   bisacodyl (DULCOLAX) 5 MG EC tablet, Take 5 mg by mouth daily as needed for moderate constipation., Disp: , Rfl:    cyclobenzaprine (FLEXERIL) 10 MG tablet, Take 1 tablet (10 mg total) by mouth 2 (two) times daily as needed for muscle spasms., Disp: 20 tablet, Rfl: 0   hydrochlorothiazide (MICROZIDE) 12.5 MG capsule, Take 1 capsule (12.5 mg total) by mouth daily., Disp: 90 capsule, Rfl: 3   metoprolol succinate (TOPROL XL) 25 MG 24 hr tablet, Take 0.5 tablets (12.5 mg total) by mouth daily., Disp: 45 tablet, Rfl: 3   nitroGLYCERIN (NITROSTAT) 0.4 MG SL tablet, Place 0.4 mg under the tongue every 5 (five) minutes as needed for chest pain., Disp: , Rfl:    oxymetazoline (AFRIN) 0.05 % nasal spray, Place 1 spray into both nostrils 2 (two) times daily as needed for congestion., Disp: , Rfl:    traMADol (ULTRAM) 50 MG tablet, Take 1-2 tablets (50-100 mg total) by mouth every 4 (four) hours as needed for moderate  pain. (Patient not taking: Reported on 05/25/2022), Disp: 30 tablet, Rfl: 0   traZODone (DESYREL) 100 MG tablet, Take 50-100 mg by mouth at bedtime as needed for sleep. (Patient not taking: Reported on 06/13/2022), Disp: , Rfl:    valsartan (DIOVAN) 40 MG tablet, Take 1 tablet (40 mg total) by mouth daily., Disp: 90 tablet, Rfl: 3   VITAMIN D PO, Take 1 capsule by mouth daily., Disp: , Rfl:   Current Facility-Administered Medications:    nitroGLYCERIN (NITROSTAT) SL tablet 0.4 mg, 0.4 mg, Sublingual, Q5 min PRN, Belva Crome, MD  Past Medical History: Past Medical History:  Diagnosis Date   Anginal pain ([place]) 02/2022   Coronary artery disease    Hypertension     Tobacco Use: Social History   Tobacco Use  Smoking Status Former   Packs/day: 1.00   Years: 35.00   Total pack years: 35.00   Types: Cigarettes   Quit date: 03/27/2022   Years since quitting: 0.2  Smokeless Tobacco Never  Tobacco Comments   Quit on 03/27/22 and has not resumed smoking    Labs: Review Flowsheet       Latest Ref Rng & Units 03/24/2022 03/28/2022 05/26/2022  Labs for ITP Cardiac and Pulmonary Rehab  Cholestrol 100 - 199 mg/dL - - 139   LDL (calc) 0 - 99 mg/dL - - 77   HDL-C >39 mg/dL - - 42   Trlycerides  0 - 149 mg/dL - - 259   Hemoglobin D6L 4.8 - 5.6 % 5.9  - -  PH, Arterial 7.35 - 7.45 7.44  7.360  7.344  7.383  7.480  7.417  7.343  7.369  -  PCO2 arterial 32 - 48 mmHg 38  43.2  42.9  38.8  36.4  39.9  44.5  51.7  -  Bicarbonate 20.0 - 28.0 mmol/L 25.8  24.3  23.4  23.6  27.1  25.8  24.2  29.8  -  TCO2 22 - 32 mmol/L - 26  25  25  24  28  28  25  27  25  27  31  27   -  Acid-base deficit 0.0 - 2.0 mmol/L - 1.0  2.0  2.0  2.0  -  O2 Saturation % 100  97  96  99  100  100  78  100  -    Capillary Blood Glucose: Lab Results  Component Value Date   GLUCAP 81 03/30/2022   GLUCAP 117 (H) 03/30/2022   GLUCAP 113 (H) 03/30/2022   GLUCAP 131 (H) 03/30/2022   GLUCAP 141 (H) 03/29/2022     Exercise  Target Goals: Exercise Program Goal: Individual exercise prescription set using results from initial 6 min walk test and THRR while considering  patient's activity barriers and safety.   Exercise Prescription Goal: Initial exercise prescription builds to 30-45 minutes a day of aerobic activity, 2-3 days per week.  Home exercise guidelines will be given to patient during program as part of exercise prescription that the participant will acknowledge.  Activity Barriers & Risk Stratification:  Activity Barriers & Cardiac Risk Stratification - 05/31/22 1027       Activity Barriers & Cardiac Risk Stratification   Activity Barriers Arthritis;Other (comment)    Comments Prior left wrist surgery, arthritis- back and knee.    Cardiac Risk Stratification Moderate             6 Minute Walk:  6 Minute Walk     Row Name 05/31/22 1011         6 Minute Walk   Phase Initial     Distance 1416 feet     Walk Time 6 minutes     # of Rest Breaks 0     MPH 2.68     METS 3.33     RPE 7     Perceived Dyspnea  0     VO2 Peak 11.66     Symptoms No     Resting HR 66 bpm     Resting BP 101/68     Resting Oxygen Saturation  98 %     Exercise Oxygen Saturation  during 6 min walk 98 %     Max Ex. HR 74 bpm     Max Ex. BP 125/77     2 Minute Post BP 109/70              Oxygen Initial Assessment:   Oxygen Re-Evaluation:   Oxygen Discharge (Final Oxygen Re-Evaluation):   Initial Exercise Prescription:  Initial Exercise Prescription - 05/31/22 1000       Date of Initial Exercise RX and Referring Provider   Date 05/31/22    Referring Provider 08/01/22, DO    Expected Discharge Date 07/29/22      Recumbant Bike   Level 2    Watts 25    Minutes 15    METs 2.9  NuStep   Level 3    SPM 85    Minutes 15    METs 2.8      Prescription Details   Frequency (times per week) 3    Duration Progress to 30 minutes of continuous aerobic without signs/symptoms of physical  distress      Intensity   THRR 40-80% of Max Heartrate 63-126    Ratings of Perceived Exertion 11-13    Perceived Dyspnea 0-4      Progression   Progression Continue to progress workloads to maintain intensity without signs/symptoms of physical distress.      Resistance Training   Training Prescription Yes    Weight 5 lbs    Reps 10-15             Perform Capillary Blood Glucose checks as needed.  Exercise Prescription Changes:   Exercise Prescription Changes     Row Name 06/06/22 1029 06/13/22 1030           Response to Exercise   Blood Pressure (Admit) 112/58 108/56      Blood Pressure (Exercise) 126/70 116/68      Blood Pressure (Exit) 87/52  116/72 recheck after water, asymptomatic 102/56      Heart Rate (Admit) 58 bpm 54 bpm      Heart Rate (Exercise) 68 bpm 78 bpm      Heart Rate (Exit) 63 bpm 54 bpm      Rating of Perceived Exertion (Exercise) 12 11.5      Symptoms None None      Comments Off to a good start with exercise. INC WL on RB today.      Duration Continue with 30 min of aerobic exercise without signs/symptoms of physical distress. Continue with 30 min of aerobic exercise without signs/symptoms of physical distress.      Intensity THRR unchanged THRR unchanged        Progression   Progression Continue to progress workloads to maintain intensity without signs/symptoms of physical distress. Continue to progress workloads to maintain intensity without signs/symptoms of physical distress.      Average METs 2.1 2.6        Resistance Training   Training Prescription Yes Yes      Weight 5 lbs 6 lbs      Reps 10-15 10-15      Time 10 Minutes 10 Minutes        Interval Training   Interval Training No No        Recumbant Bike   Level 2 3      Watts 27 --      Minutes 15 15      METs 2.2 2.4        NuStep   Level 3 3      SPM 64 85      Minutes 15 15      METs 2 2.8        Home Exercise Plan   Plans to continue exercise at -- [employer] (comment)  Walking, stationary bike and weights at gym.      Frequency -- Add 4 additional days to program exercise sessions.      Initial Home Exercises Provided -- 06/13/22               Exercise Comments:   Exercise Comments     Row Name 06/06/22 1130 06/13/22 1115         Exercise Comments Patient tolerated low intensity exercise well  without symptoms. Reviewed METs and goals with patient. Discussed current exericse routine. Will review home exercise guidelines in the coming sessions.               Exercise Goals and Review:   Exercise Goals     Row Name 05/31/22 0951             Exercise Goals   Increase Physical Activity Yes       Intervention Provide advice, education, support and counseling about physical activity/exercise needs.;Develop an individualized exercise prescription for aerobic and resistive training based on initial evaluation findings, risk stratification, comorbidities and participant's personal goals.       Expected Outcomes Short Term: Attend rehab on a regular basis to increase amount of physical activity.;Long Term: Exercising regularly at least 3-5 days a week.;Long Term: Add in home exercise to make exercise part of routine and to increase amount of physical activity.       Increase Strength and Stamina Yes       Intervention Provide advice, education, support and counseling about physical activity/exercise needs.;Develop an individualized exercise prescription for aerobic and resistive training based on initial evaluation findings, risk stratification, comorbidities and participant's personal goals.       Expected Outcomes Short Term: Increase workloads from initial exercise prescription for resistance, speed, and METs.;Short Term: Perform resistance training exercises routinely during rehab and add in resistance training at home;Long Term: Improve cardiorespiratory fitness, muscular endurance and strength as measured by increased METs  and functional capacity ( )       Able to understand and use rate of perceived exertion (RPE) scale Yes       Intervention Provide education and explanation on how to use RPE scale       Expected Outcomes Short Term: Able to use RPE daily in rehab to express subjective intensity level;Long Term:  Able to use RPE to guide intensity level when exercising independently       Knowledge and understanding of Target Heart Rate Range (THRR) Yes       Intervention Provide education and explanation of THRR including how the numbers were predicted and where they are located for reference       Expected Outcomes Short Term: Able to state/look up THRR;Long Term: Able to use THRR to govern intensity when exercising independently;Short Term: Able to use daily as guideline for intensity in rehab       Able to check pulse independently Yes       Intervention Provide education and demonstration on how to check pulse in carotid and radial arteries.;Review the importance of being able to check your own pulse for safety during independent exercise       Expected Outcomes Short Term: Able to explain why pulse checking is important during independent exercise;Long Term: Able to check pulse independently and accurately       Understanding of Exercise Prescription Yes       Intervention Provide education, explanation, and written materials on patient's individual exercise prescription       Expected Outcomes Short Term: Able to explain program exercise prescription;Long Term: Able to explain home exercise prescription to exercise independently                Exercise Goals Re-Evaluation :  Exercise Goals Re-Evaluation     Row Name 06/06/22 1130 06/13/22 1115           Exercise Goal Re-Evaluation   Exercise Goals Review Increase Physical Activity;Able to understand and use  rate of perceived exertion (RPE) scale;Increase Strength and Stamina Increase Physical Activity;Able to understand and use rate of  perceived exertion (RPE) scale;Increase Strength and Stamina      Comments Off to a good start with exercise. Patient is making good progress with exercise at cardiac rehab. Patient is walking 12-19 minutes daily, riding stationary bike 2-3 miles, 21-28 minutes at fitness center, and weight lifting at home and at the gym. Patient's goal is to increase strength.      Expected Outcomes Progress workloads as tolerated to help increase cardiorespiratory fitness. Patient will continue current daily exercise routine to help increase strength.               Discharge Exercise Prescription (Final Exercise Prescription Changes):  Exercise Prescription Changes - 06/13/22 1030       Response to Exercise   Blood Pressure (Admit) 108/56    Blood Pressure (Exercise) 116/68    Blood Pressure (Exit) 102/56    Heart Rate (Admit) 54 bpm    Heart Rate (Exercise) 78 bpm    Heart Rate (Exit) 54 bpm    Rating of Perceived Exertion (Exercise) 11.5    Symptoms None    Comments INC WL on RB today.    Duration Continue with 30 min of aerobic exercise without signs/symptoms of physical distress.    Intensity THRR unchanged      Progression   Progression Continue to progress workloads to maintain intensity without signs/symptoms of physical distress.    Average METs 2.6      Resistance Training   Training Prescription Yes    Weight 6 lbs    Reps 10-15    Time 10 Minutes      Interval Training   Interval Training No      Recumbant Bike   Level 3    Minutes 15    METs 2.4      NuStep   Level 3    SPM 85    Minutes 15    METs 2.8      Home Exercise Plan   Plans to continue exercise at [employer] (comment)   Walking, stationary bike and weights at gym.   Frequency Add 4 additional days to program exercise sessions.    Initial Home Exercises Provided 06/13/22             Nutrition:  Target Goals: Understanding of nutrition guidelines, daily intake of sodium 1500mg , cholesterol  200mg , calories 30% from fat and 7% or less from saturated fats, daily to have 5 or more servings of fruits and vegetables.  Biometrics:  Pre Biometrics - 05/31/22 0943       Pre Biometrics   Waist Circumference 39.25 inches    Hip Circumference 40.75 inches    Waist to Hip Ratio 0.96 %    Triceps Skinfold 17 mm    % Body Fat 27.1 %    Grip Strength 52 kg    Flexibility 0 in   Knees bent   Single Leg Stand 30 seconds              Nutrition Therapy Plan and Nutrition Goals:  Nutrition Therapy & Goals - 06/06/22 1548       Nutrition Therapy   Diet Heart Healthy diet    Drug/Food Interactions Statins/Certain Fruits      Personal Nutrition Goals   Nutrition Goal Patient to choose a daily variety of fruits, vegetables, whole grains, nonfat dairy, lean protein/plant protein as part of heart  healthy lifestyle    Personal Goal #2 Patient to limit to 1500mg  of sodium per day.    Personal Goal #3 Patient to identify and limit food souces of saturated fat, trans fat, sodium, and refined carbohydrates.    Comments Patient reports no nutitions questions/concerns at this time.      Intervention Plan   Intervention Prescribe, educate and counsel regarding individualized specific dietary modifications aiming towards targeted core components such as weight, hypertension, lipid management, diabetes, heart failure and other comorbidities.    Expected Outcomes Short Term Goal: Understand basic principles of dietary content, such as calories, fat, sodium, cholesterol and nutrients.;Long Term Goal: Adherence to prescribed nutrition plan.             Nutrition Assessments:  Nutrition Assessments - 06/07/22 0845       Rate Your Plate Scores   Pre Score 53            MEDIFICTS Score Key: ?70 Need to make dietary changes  40-70 Heart Healthy Diet ? 40 Therapeutic Level Cholesterol Diet   Flowsheet Row INTENSIVE CARDIAC REHAB from 06/06/2022 in [place]  Picture Your Plate Total Score on Admission 53      Picture Your Plate Scores: [id number] Unhealthy dietary pattern with much room for improvement. 41-50 Dietary pattern unlikely to meet recommendations for good health and room for improvement. 51-60 More healthful dietary pattern, with some room for improvement.  >60 Healthy dietary pattern, although there may be some specific behaviors that could be improved.    Nutrition Goals Re-Evaluation:  Nutrition Goals Re-Evaluation     Row Name 06/06/22 1548             Goals   Current Weight 181 John 7 oz (82.3 kg)       Comment Low RBC 3.8, low Hemoglobin 10.5, low hematocrit 31.7                Nutrition Goals Re-Evaluation:  Nutrition Goals Re-Evaluation     Row Name 06/06/22 1548             Goals   Current Weight 181 John 7 oz (82.3 kg)       Comment Low RBC 3.8, low Hemoglobin 10.5, low hematocrit 31.7                Nutrition Goals Discharge (Final Nutrition Goals Re-Evaluation):  Nutrition Goals Re-Evaluation - 06/06/22 1548       Goals   Current Weight 181 John 7 oz (82.3 kg)    Comment Low RBC 3.8, low Hemoglobin 10.5, low hematocrit 31.7             Psychosocial: Target Goals: Acknowledge presence or absence of significant depression and/or stress, maximize coping skills, provide positive support system. Participant is able to verbalize types and ability to use techniques and skills needed for reducing stress and depression.  Initial Review & Psychosocial Screening:  Initial Psych Review & Screening - 05/31/22 1201       Initial Review   Current issues with None Identified      Family Dynamics   Good Support System? Yes   [name] has his spouse for support     Screening Interventions   Interventions Encouraged to exercise             Quality of Life Scores:  Quality of Life - 05/31/22 1118       Quality of Life   Select Quality  of Life      Quality of Life Scores    Health/Function Pre 22.57 %    Socioeconomic Pre 22.29 %    Psych/Spiritual Pre 24.64 %    Family Pre 26.1 %    GLOBAL Pre 23.46 %            Scores of 19 and below usually indicate a poorer quality of life in these areas.  A difference of  2-3 points is a clinically meaningful difference.  A difference of 2-3 points in the total score of the Quality of Life Index has been associated with significant improvement in overall quality of life, self-image, physical symptoms, and general health in studies assessing change in quality of life.  PHQ-9: Review Flowsheet       05/31/2022  Depression screen PHQ 2/9  Decreased Interest 0  Down, Depressed, Hopeless 0  PHQ - 2 Score 0   Interpretation of Total Score  Total Score Depression Severity:  1-4 = Minimal depression, 5-9 = Mild depression, 10-14 = Moderate depression, 15-19 = Moderately severe depression, 20-27 = Severe depression   Psychosocial Evaluation and Intervention:   Psychosocial Re-Evaluation:  Psychosocial Re-Evaluation     [place] Name 06/14/22 0825             Psychosocial Re-Evaluation   Current issues with None Identified       Interventions Encouraged to attend Cardiac Rehabilitation for the exercise       Continue Psychosocial Services  No Follow up required                Psychosocial Discharge (Final Psychosocial Re-Evaluation):  Psychosocial Re-Evaluation - 06/14/22 0825       Psychosocial Re-Evaluation   Current issues with None Identified    Interventions Encouraged to attend Cardiac Rehabilitation for the exercise    Continue Psychosocial Services  No Follow up required             Vocational Rehabilitation: Provide vocational rehab assistance to qualifying candidates.   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 Participant will state understanding/return demonstration of topics presented.    Education - 06/13/22 1300       Education   Cardiac Education Topics Pritikin    Select Core Videos      Core Videos   Educator Exercise Physiologist    Select Nutrition    Nutrition Nutrition Action Plan    Instruction Review Code 1- Verbalizes Understanding    Class Start Time 1149    Class Stop Time 1225    Class Time Calculation (min) 36 min             Core Videos: Exercise    Move It!  Clinical staff conducted group or individual video education with verbal and written material and guidebook.  Patient learns the recommended Pritikin exercise program. Exercise with the goal of living a long, healthy life. Some of the health benefits of exercise include controlled diabetes, healthier blood pressure levels, improved cholesterol levels, improved heart and lung capacity, improved sleep, and better body composition. Everyone should speak with their doctor  before starting or changing an exercise routine.  Biomechanical Limitations Clinical staff conducted group or individual video education with verbal and written material and guidebook.  Patient learns how biomechanical limitations can impact exercise and how we can mitigate and possibly overcome limitations to have an impactful and balanced exercise routine.  Body Composition Clinical staff conducted group or individual video education with verbal and written material and guidebook.  Patient learns that body composition (ratio of muscle mass to fat mass) is a key component to assessing overall fitness, rather than body weight alone. Increased fat mass, especially visceral belly fat, can put [place] at increased risk for metabolic syndrome, type 2 diabetes, heart disease,  and even death. It is recommended to combine diet and exercise (cardiovascular and resistance training) to improve your body composition. Seek guidance from your physician and exercise physiologist before implementing an exercise routine.  Exercise Action Plan Clinical staff conducted group or individual video education with verbal and written material and guidebook.  Patient learns the recommended strategies to achieve and enjoy long-term exercise adherence, including variety, self-motivation, self-efficacy, and positive decision making. Benefits of exercise include fitness, good health, weight management, more energy, better sleep, less stress, and overall well-being.  Medical   Heart Disease Risk Reduction Clinical staff conducted group or individual video education with verbal and written material and guidebook.  Patient learns our heart is our most vital organ as it circulates oxygen, nutrients, white blood cells, and hormones throughout the entire body, and carries waste away. Data supports a plant-based eating plan like the Pritikin Program for its effectiveness in slowing progression of and reversing heart disease. The video provides a number of recommendations to address heart disease.   Metabolic Syndrome and Belly Fat  Clinical staff conducted group or individual video education with verbal and written material and guidebook.  Patient learns what metabolic syndrome is, how it leads to heart disease, and how one can reverse it and keep it from coming back. You have metabolic syndrome if you have 3 of the following 5 criteria: abdominal obesity, high blood pressure, high triglycerides, low HDL cholesterol, and high blood sugar.  Hypertension and Heart Disease Clinical staff conducted group or individual video education with verbal and written material and guidebook.  Patient learns that high blood pressure, or hypertension, is very common in the [place]. Hypertension is largely due to  excessive salt intake, but other important risk factors include being overweight, physical inactivity, drinking too much alcohol, smoking, and not eating enough potassium from fruits and vegetables. High blood pressure is a leading risk factor for heart attack, stroke, congestive heart failure, dementia, kidney failure, and premature death. Long-term effects of excessive salt intake include stiffening of the arteries and thickening of heart muscle and organ damage. Recommendations include ways to reduce hypertension and the risk of heart disease.  Diseases of Our Time - Focusing on Diabetes Clinical staff conducted group or individual video education with verbal and written material and guidebook.  Patient learns why the best way to stop diseases of our time is prevention, through food and other lifestyle changes. Medicine (such as prescription pills and surgeries) is often only a Band-Aid on the problem, not a long-term solution. Most common diseases of our time include obesity, type 2 diabetes, hypertension, heart disease, and cancer. The Pritikin Program is recommended and has been proven to help reduce, reverse, and/or prevent the damaging effects of metabolic syndrome.  Nutrition   Overview of the Pritikin Eating Plan  Clinical  staff conducted group or individual video education with verbal and written material and guidebook.  Patient learns about the [place] for disease risk reduction. The [place] emphasizes a wide variety of unrefined, minimally-processed carbohydrates, like fruits, vegetables, whole grains, and legumes. Go, Caution, and Stop food choices are explained. Plant-based and lean animal proteins are emphasized. Rationale provided for low sodium intake for blood pressure control, low added sugars for blood sugar stabilization, and low added fats and oils for coronary artery disease risk reduction and weight management.  Calorie Density  Clinical staff conducted  group or individual video education with verbal and written material and guidebook.  Patient learns about calorie density and how it impacts the Pritikin Eating Plan. Knowing the characteristics of the food you choose will help you decide whether those foods will lead to weight gain or weight loss, and whether you want to consume more or less of them. Weight loss is usually a side effect of the Pritikin Eating Plan because of its focus on low calorie-dense foods.  Label Reading  Clinical staff conducted group or individual video education with verbal and written material and guidebook.  Patient learns about the Pritikin recommended label reading guidelines and corresponding recommendations regarding calorie density, added sugars, sodium content, and whole grains.  Dining Out - Part 1  Clinical staff conducted group or individual video education with verbal and written material and guidebook.  Patient learns that restaurant meals can be sabotaging because they can be so high in calories, fat, sodium, and/or sugar. Patient learns recommended strategies on how to positively address this and avoid unhealthy pitfalls.  Facts on Fats  Clinical staff conducted group or individual video education with verbal and written material and guidebook.  Patient learns that lifestyle modifications can be just as effective, if not more so, as many medications for lowering your risk of heart disease. A Pritikin lifestyle can help to reduce your risk of inflammation and atherosclerosis (cholesterol build-up, or plaque, in the artery walls). Lifestyle interventions such as dietary choices and physical activity address the cause of atherosclerosis. A review of the types of fats and their impact on blood cholesterol levels, along with dietary recommendations to reduce fat intake is also included.  Nutrition Action Plan  Clinical staff conducted group or individual video education with verbal and written material and  guidebook.  Patient learns how to incorporate Pritikin recommendations into their lifestyle. Recommendations include planning and keeping personal health goals in mind as an important part of their success.  Healthy Mind-Set    Healthy Minds, Bodies, Hearts  Clinical staff conducted group or individual video education with verbal and written material and guidebook.  Patient learns how to identify when they are stressed. Video will discuss the impact of that stress, as well as the many benefits of stress management. Patient will also be introduced to stress management techniques. The way we think, act, and feel has an impact on our hearts.  How Our Thoughts Can Heal Our Hearts  Clinical staff conducted group or individual video education with verbal and written material and guidebook.  Patient learns that negative thoughts can cause depression and anxiety. This can result in negative lifestyle behavior and serious health problems. Cognitive behavioral therapy is an effective method to help control our thoughts in order to change and improve our emotional outlook.  Additional Videos:  Exercise    Improving Performance  Clinical staff conducted group or individual video education with verbal and written material and  guidebook.  Patient learns to use a non-linear approach by alternating intensity levels and lengths of time spent exercising to help burn more calories and lose more body fat. Cardiovascular exercise helps improve heart health, metabolism, hormonal balance, blood sugar control, and recovery from fatigue. Resistance training improves strength, endurance, balance, coordination, reaction time, metabolism, and muscle mass. Flexibility exercise improves circulation, posture, and balance. Seek guidance from your physician and exercise physiologist before implementing an exercise routine and learn your capabilities and proper form for all exercise.  Introduction to Yoga  Clinical staff  conducted group or individual video education with verbal and written material and guidebook.  Patient learns about yoga, a discipline of the coming together of mind, breath, and body. The benefits of yoga include improved flexibility, improved range of motion, better posture and core strength, increased lung function, weight loss, and positive self-image. Yoga's heart health benefits include lowered blood pressure, healthier heart rate, decreased cholesterol and triglyceride levels, improved immune function, and reduced stress. Seek guidance from your physician and exercise physiologist before implementing an exercise routine and learn your capabilities and proper form for all exercise.  Medical   Aging: Enhancing Your Quality of Life  Clinical staff conducted group or individual video education with verbal and written material and guidebook.  Patient learns key strategies and recommendations to stay in good physical health and enhance quality of life, such as prevention strategies, having an advocate, securing a [place], and keeping a list of medications and system for tracking them. It also discusses how to avoid risk for bone loss.  Biology of Weight Control  Clinical staff conducted group or individual video education with verbal and written material and guidebook.  Patient learns that weight gain occurs because we consume more calories than we burn (eating more, moving less). Even if your body weight is normal, you may have higher ratios of fat compared to muscle mass. Too much body fat puts you at increased risk for cardiovascular disease, heart attack, stroke, type 2 diabetes, and obesity-related cancers. In addition to exercise, following the [place] can help reduce your risk.  Decoding Lab Results  Clinical staff conducted group or individual video education with verbal and written material and guidebook.  Patient learns that lab test reflects one  measurement whose values change over time and are influenced by many factors, including medication, stress, sleep, exercise, food, hydration, pre-existing medical conditions, and more. It is recommended to use the knowledge from this video to become more involved with your lab results and evaluate your numbers to speak with your doctor.   Diseases of Our Time - Overview  Clinical staff conducted group or individual video education with verbal and written material and guidebook.  Patient learns that according to the CDC, 50% to 70% of chronic diseases (such as obesity, type 2 diabetes, elevated lipids, hypertension, and heart disease) are avoidable through lifestyle improvements including healthier food choices, listening to satiety cues, and increased physical activity.  Sleep Disorders Clinical staff conducted group or individual video education with verbal and written material and guidebook.  Patient learns how good quality and duration of sleep are important to overall health and well-being. Patient also learns about sleep disorders and how they impact health along with recommendations to address them, including discussing with a physician.  Nutrition  Dining Out - Part 2 Clinical staff conducted group or individual video education with verbal and written material and guidebook.  Patient learns how to  plan ahead and communicate in order to maximize their dining experience in a healthy and nutritious manner. Included are recommended food choices based on the type of restaurant the patient is visiting.   Fueling a Best boy conducted group or individual video education with verbal and written material and guidebook.  There is a strong connection between our food choices and our health. Diseases like obesity and type 2 diabetes are very prevalent and are in large-part due to lifestyle choices. The Pritikin Eating Plan provides plenty of food and hunger-curbing satisfaction. It is  easy to follow, affordable, and helps reduce health risks.  Menu Workshop  Clinical staff conducted group or individual video education with verbal and written material and guidebook.  Patient learns that restaurant meals can sabotage health goals because they are often packed with calories, fat, sodium, and sugar. Recommendations include strategies to plan ahead and to communicate with the manager, chef, or server to help order a healthier meal.  Planning Your Eating Strategy  Clinical staff conducted group or individual video education with verbal and written material and guidebook.  Patient learns about the [place] and its benefit of reducing the risk of disease. The [place] does not focus on calories. Instead, it emphasizes high-quality, nutrient-rich foods. By knowing the characteristics of the foods, we choose, we can determine their calorie density and make informed decisions.  Targeting Your Nutrition Priorities  Clinical staff conducted group or individual video education with verbal and written material and guidebook.  Patient learns that lifestyle habits have a tremendous impact on disease risk and progression. This video provides eating and physical activity recommendations based on your personal health goals, such as reducing LDL cholesterol, losing weight, preventing or controlling type 2 diabetes, and reducing high blood pressure.  Vitamins and Minerals  Clinical staff conducted group or individual video education with verbal and written material and guidebook.  Patient learns different ways to obtain key vitamins and minerals, including through a recommended healthy diet. It is important to discuss all supplements you take with your doctor.   Healthy Mind-Set    Smoking Cessation  Clinical staff conducted group or individual video education with verbal and written material and guidebook.  Patient learns that cigarette smoking and tobacco addiction pose  a serious health risk which affects millions of people. Stopping smoking will significantly reduce the risk of heart disease, lung disease, and many forms of cancer. Recommended strategies for quitting are covered, including working with your doctor to develop a successful plan.  Culinary   Becoming a Financial trader conducted group or individual video education with verbal and written material and guidebook.  Patient learns that cooking at home can be healthy, cost-effective, quick, and puts them in control. Keys to cooking healthy recipes will include looking at your recipe, assessing your equipment needs, planning ahead, making it simple, choosing cost-effective seasonal ingredients, and limiting the use of added fats, salts, and sugars.  Cooking - Breakfast and Snacks  Clinical staff conducted group or individual video education with verbal and written material and guidebook.  Patient learns how important breakfast is to satiety and nutrition through the entire day. Recommendations include key foods to eat during breakfast to help stabilize blood sugar levels and to prevent overeating at meals later in the day. Planning ahead is also a key component.  Cooking - Human resources officer conducted group or individual video education with verbal and written material and  guidebook.  Patient learns eating strategies to improve overall health, including an approach to cook more at home. Recommendations include thinking of animal protein as a side on your plate rather than center stage and focusing instead on lower calorie dense options like vegetables, fruits, whole grains, and plant-based proteins, such as beans. Making sauces in large quantities to freeze for later and leaving the skin on your vegetables are also recommended to maximize your experience.  Cooking - Healthy Salads and Dressing Clinical staff conducted group or individual video education with verbal and written  material and guidebook.  Patient learns that vegetables, fruits, whole grains, and legumes are the foundations of the Pritikin Eating Plan. Recommendations include how to incorporate each of these in flavorful and healthy salads, and how to create homemade salad dressings. Proper handling of ingredients is also covered. Cooking - Soups and [employer] - Soups and Desserts Clinical staff conducted group or individual video education with verbal and written material and guidebook.  Patient learns that Pritikin soups and desserts make for easy, nutritious, and delicious snacks and meal components that are low in sodium, fat, sugar, and calorie density, while high in vitamins, minerals, and filling fiber. Recommendations include simple and healthy ideas for soups and desserts.   Overview     The Pritikin Solution Program Overview Clinical staff conducted group or individual video education with verbal and written material and guidebook.  Patient learns that the results of the Pritikin Program have been documented in more than 100 articles published in peer-reviewed journals, and the benefits include reducing risk factors for (and, in some cases, even reversing) high cholesterol, high blood pressure, type 2 diabetes, obesity, and more! An overview of the three key pillars of the Pritikin Program will be covered: eating well, doing regular exercise, and having a healthy mind-set.  WORKSHOPS  Exercise: Exercise Basics: Building Your Action Plan Clinical staff led group instruction and group discussion with PowerPoint presentation and patient guidebook. To enhance the learning environment the use of posters, models and videos may be added. At the conclusion of this workshop, patients will comprehend the difference between physical activity and exercise, as well as the benefits of incorporating both, into their routine. Patients will understand the FITT (Frequency, Intensity, Time, and Type) principle  and how to use it to build an exercise action plan. In addition, safety concerns and other considerations for exercise and cardiac rehab will be addressed by the presenter. The purpose of this lesson is to promote a comprehensive and effective weekly exercise routine in order to improve patients' overall level of fitness.   Managing Heart Disease: Your Path to a Healthier Heart Clinical staff led group instruction and group discussion with PowerPoint presentation and patient guidebook. To enhance the learning environment the use of posters, models and videos may be added.At the conclusion of this workshop, patients will understand the anatomy and physiology of the heart. Additionally, they will understand how Pritikin's three pillars impact the risk factors, the progression, and the management of heart disease.  The purpose of this lesson is to provide a high-level overview of the heart, heart disease, and how the Pritikin lifestyle positively impacts risk factors.  Exercise Biomechanics Clinical staff led group instruction and group discussion with PowerPoint presentation and patient guidebook. To enhance the learning environment the use of posters, models and videos may be added. Patients will learn how the structural parts of their bodies function and how these functions impact their daily activities, movement, and exercise.  Patients will learn how to promote a neutral spine, learn how to manage pain, and identify ways to improve their physical movement in order to promote healthy living. The purpose of this lesson is to expose patients to common physical limitations that impact physical activity. Participants will learn practical ways to adapt and manage aches and pains, and to minimize their effect on regular exercise. Patients will learn how to maintain good posture while sitting, walking, and lifting.  Balance Training and Fall Prevention  Clinical staff led group instruction and group  discussion with PowerPoint presentation and patient guidebook. To enhance the learning environment the use of posters, models and videos may be added. At the conclusion of this workshop, patients will understand the importance of their sensorimotor skills (vision, proprioception, and the vestibular system) in maintaining their ability to balance as they age. Patients will apply a variety of balancing exercises that are appropriate for their current level of function. Patients will understand the common causes for poor balance, possible solutions to these problems, and ways to modify their physical environment in order to minimize their fall risk. The purpose of this lesson is to teach patients about the importance of maintaining balance as they age and ways to minimize their risk of falling.  WORKSHOPS   Nutrition:  Fueling a Scientist, research (physical sciences) led group instruction and group discussion with PowerPoint presentation and patient guidebook. To enhance the learning environment the use of posters, models and videos may be added. Patients will review the foundational principles of the [place] and understand what constitutes a serving size in each of the food groups. Patients will also learn Pritikin-friendly foods that are better choices when away from home and review make-ahead meal and snack options. Calorie density will be reviewed and applied to three nutrition priorities: weight maintenance, weight loss, and weight gain. The purpose of this lesson is to reinforce (in a group setting) the key concepts around what patients are recommended to eat and how to apply these guidelines when away from home by planning and selecting Pritikin-friendly options. Patients will understand how calorie density may be adjusted for different weight management goals.  Mindful Eating  Clinical staff led group instruction and group discussion with PowerPoint presentation and patient guidebook. To enhance  the learning environment the use of posters, models and videos may be added. Patients will briefly review the concepts of the [place] and the importance of low-calorie dense foods. The concept of mindful eating will be introduced as well as the importance of paying attention to internal hunger signals. Triggers for non-hunger eating and techniques for dealing with triggers will be explored. The purpose of this lesson is to provide patients with the opportunity to review the basic principles of the [place], discuss the value of eating mindfully and how to measure internal cues of hunger and fullness using the Hunger Scale. Patients will also discuss reasons for non-hunger eating and learn strategies to use for controlling emotional eating.  Targeting Your Nutrition Priorities Clinical staff led group instruction and group discussion with PowerPoint presentation and patient guidebook. To enhance the learning environment the use of posters, models and videos may be added. Patients will learn how to determine their genetic susceptibility to disease by reviewing their family history. Patients will gain insight into the importance of diet as part of an overall healthy lifestyle in mitigating the impact of genetics and other environmental insults. The purpose of this lesson is to provide patients with  the opportunity to assess their personal nutrition priorities by looking at their family history, their own health history and current risk factors. Patients will also be able to discuss ways of prioritizing and modifying the [place] for their highest risk areas  Menu  Clinical staff led group instruction and group discussion with PowerPoint presentation and patient guidebook. To enhance the learning environment the use of posters, models and videos may be added. Using menus brought in from [employer], or printed from [employer], patients will apply the [place] dining out  guidelines that were presented in the [employer] video. Patients will also be able to practice these guidelines in a variety of provided scenarios. The purpose of this lesson is to provide patients with the opportunity to practice hands-on learning of the [place] with actual menus and practice scenarios.  Label Reading Clinical staff led group instruction and group discussion with PowerPoint presentation and patient guidebook. To enhance the learning environment the use of posters, models and videos may be added. Patients will review and discuss the Pritikin label reading guidelines presented in Pritikin's Label Reading Educational series video. Using fool labels brought in from local grocery stores and markets, patients will apply the label reading guidelines and determine if the packaged food meet the Pritikin guidelines. The purpose of this lesson is to provide patients with the opportunity to review, discuss, and practice hands-on learning of the Pritikin Label Reading guidelines with actual packaged food labels. [place] Workshops are designed to teach patients ways to prepare quick, simple, and affordable recipes at home. The importance of nutrition's role in chronic disease risk reduction is reflected in its emphasis in the overall Pritikin program. By learning how to prepare essential core Pritikin Eating Plan recipes, patients will increase control over what they eat; be able to customize the flavor of foods without the use of added salt, sugar, or fat; and improve the quality of the food they consume. By learning a set of core recipes which are easily assembled, quickly prepared, and affordable, patients are more likely to prepare more healthy foods at home. These workshops focus on convenient breakfasts, simple entres, side dishes, and desserts which can be prepared with minimal effort and are consistent with nutrition  recommendations for cardiovascular risk reduction. Cooking [employer] are taught by a Engineer, materials (RD) who has been trained by the [employer]. The chef or RD has a clear understanding of the importance of minimizing - if not completely eliminating - added fat, sugar, and sodium in recipes. Throughout the series of [place] Workshop sessions, patients will learn about healthy ingredients and efficient methods of cooking to build confidence in their capability to prepare    Cooking School weekly topics:  Adding Flavor- Sodium-Free  Fast and Healthy Breakfasts  Powerhouse Plant-Based Proteins  Satisfying Salads and Dressings  Simple Sides and Sauces  International Cuisine-Spotlight on the [place]ones  Delicious Desserts  Savory Soups  Efficiency Cooking - Meals in a Snap  Tasty Appetizers and Snacks  Comforting Weekend Breakfasts  One-Pot Wonders   Fast Evening Meals  Easy [place] (Psychosocial): New Thoughts, New Behaviors Clinical staff led group instruction and group discussion with PowerPoint presentation and patient guidebook. To enhance the learning environment the use of posters, models and videos may be added. Patients will learn and practice techniques for developing effective  health and lifestyle goals. Patients will be able to effectively apply the goal setting process learned to develop at least one new personal goal.  The purpose of this lesson is to expose patients to a new skill set of behavior modification techniques such as techniques setting SMART goals, overcoming barriers, and achieving new thoughts and new behaviors.  Managing Moods and Relationships Clinical staff led group instruction and group discussion with PowerPoint presentation and patient guidebook. To enhance the learning environment the use of posters, models and videos may be added. Patients will  learn how emotional and chronic stress factors can impact their health and relationships. They will learn healthy ways to manage their moods and utilize positive coping mechanisms. In addition, ICR patients will learn ways to improve communication skills. The purpose of this lesson is to expose patients to ways of understanding how one's mood and health are intimately connected. Developing a healthy outlook can help build positive relationships and connections with others. Patients will understand the importance of utilizing effective communication skills that include actively listening and being heard. They will learn and understand the importance of the "4 Cs" and especially Connections in fostering of a Healthy Mind-Set.  Healthy Sleep for a Healthy Heart Clinical staff led group instruction and group discussion with PowerPoint presentation and patient guidebook. To enhance the learning environment the use of posters, models and videos may be added. At the conclusion of this workshop, patients will be able to demonstrate knowledge of the importance of sleep to overall health, well-being, and quality of life. They will understand the symptoms of, and treatments for, common sleep disorders. Patients will also be able to identify daytime and nighttime behaviors which impact sleep, and they will be able to apply these tools to help manage sleep-related challenges. The purpose of this lesson is to provide patients with a general overview of sleep and outline the importance of quality sleep. Patients will learn about a few of the most common sleep disorders. Patients will also be introduced to the concept of "sleep hygiene," and discover ways to self-manage certain sleeping problems through simple daily behavior changes. Finally, the workshop will motivate patients by clarifying the links between quality sleep and their goals of heart-healthy living.   Recognizing and Reducing Stress Clinical staff led group  instruction and group discussion with PowerPoint presentation and patient guidebook. To enhance the learning environment the use of posters, models and videos may be added. At the conclusion of this workshop, patients will be able to understand the types of stress reactions, differentiate between acute and chronic stress, and recognize the impact that chronic stress has on their health. They will also be able to apply different coping mechanisms, such as reframing negative self-talk. Patients will have the opportunity to practice a variety of stress management techniques, such as deep abdominal breathing, progressive muscle relaxation, and/or guided imagery.  The purpose of this lesson is to educate patients on the role of stress in their lives and to provide healthy techniques for coping with it.  Learning Barriers/Preferences:  Learning Barriers/Preferences - 05/31/22 1203       Learning Barriers/Preferences   Learning Barriers Sight   Wears glasses   Learning Preferences Audio;Skilled Demonstration;Verbal Instruction;Computer/Internet;Group Instruction;Video;Individual Instruction;Written Material;Pictoral             Education Topics:  Knowledge Questionnaire Score:  Knowledge Questionnaire Score - 05/31/22 1119       Knowledge Questionnaire Score   Pre Score 21/28  Core Components/Risk Factors/Patient Goals at Admission:  Personal Goals and Risk Factors at Admission - 05/31/22 0951       Core Components/Risk Factors/Patient Goals on Admission   Tobacco Cessation Yes   Quit on 03/27/2022   Number of packs per day 1    Intervention Assist the participant in steps to quit. Provide individualized education and counseling about committing to Tobacco Cessation, relapse prevention, and pharmacological support that can be provided by physician.    Expected Outcomes Long Term: Complete abstinence from all tobacco products for at least 12 months from quit date.;Short Term:  Will quit all tobacco product use, adhering to prevention of relapse plan.    Hypertension Yes    Intervention Provide education on lifestyle modifcations including regular physical activity/exercise, weight management, moderate sodium restriction and increased consumption of fresh fruit, vegetables, and low fat dairy, alcohol moderation, and smoking cessation.;Monitor prescription use compliance.    Expected Outcomes Long Term: Maintenance of blood pressure at goal levels.    Lipids Yes    Intervention Provide education and support for participant on nutrition & aerobic/resistive exercise along with prescribed medications to achieve LDL 70mg , HDL >40mg .    Expected Outcomes Short Term: Participant states understanding of desired cholesterol values and is compliant with medications prescribed. Participant is following exercise prescription and nutrition guidelines.;Long Term: Cholesterol controlled with medications as prescribed, with individualized exercise RX and with personalized nutrition plan. Value goals: LDL < 70mg , HDL > 40 mg.             Core Components/Risk Factors/Patient Goals Review:   Goals and Risk Factor Review     Row Name 06/14/22 0826             Core Components/Risk Factors/Patient Goals Review   Personal Goals Review Tobacco Cessation;Hypertension;Lipids       Review [name] has good attendance and participation in phase intensive  cardiac rehab. [name] continues to abstain from smoking       Expected Outcomes [name] will have good attendance and participation in intensive cardiac rehab.                Core Components/Risk Factors/Patient Goals at Discharge (Final Review):   Goals and Risk Factor Review - 06/14/22 0826       Core Components/Risk Factors/Patient Goals Review   Personal Goals Review Tobacco Cessation;Hypertension;Lipids    Review [name] has good attendance and participation in phase intensive  cardiac rehab. [name] continues to abstain from smoking     Expected Outcomes [name] will have good attendance and participation in intensive cardiac rehab.             ITP Comments:  ITP Comments     Row Name 05/31/22 0943 06/14/22 0823         ITP Comments Medical Director- [name], MD. Introduction to [facility]. Initial [facility] Orientation Packet Reviewed with the patient 30 Day ITP Review. [name] is off to a good start to exercise at [facility]               Comments: See ITP Comments.

## 2022-06-15 ENCOUNTER — Encounter (HOSPITAL_COMMUNITY)
Admission: RE | Admit: 2022-06-15 | Discharge: 2022-06-15 | Disposition: A | Discharge status: Home / Self Care | Payer: 59 | Source: Ambulatory Visit | Attending: Cardiology | Admitting: Cardiology

## 2022-06-15 DIAGNOSIS — Z951 Presence of aortocoronary bypass graft: Secondary | ICD-10-CM

## 2022-06-17 ENCOUNTER — Encounter (HOSPITAL_COMMUNITY): Payer: 59

## 2022-06-20 ENCOUNTER — Encounter (HOSPITAL_COMMUNITY)
Admission: RE | Admit: 2022-06-20 | Discharge: 2022-06-20 | Disposition: A | Discharge status: Home / Self Care | Payer: 59 | Source: Ambulatory Visit | Attending: Cardiology | Admitting: Cardiology

## 2022-06-20 DIAGNOSIS — Z951 Presence of aortocoronary bypass graft: Secondary | ICD-10-CM | POA: Diagnosis not present

## 2022-06-20 NOTE — Progress Notes (Signed)
Reviewed home exercise guidelines with patient including endpoints, temperature precautions, target heart rate and rate of perceived exertion. Patient is currently walking and/or riding stationary bike at local fitness daily as his mode of home exercise. Patient is also lifting weights at home home and at the gym. Patient's goal is to return to work. Patient voices understanding of instructions given.  Artist Pais, MS, ACSM CEP

## 2022-06-22 ENCOUNTER — Encounter (HOSPITAL_COMMUNITY)
Admission: RE | Admit: 2022-06-22 | Discharge: 2022-06-22 | Disposition: A | Discharge status: Home / Self Care | Payer: 59 | Source: Ambulatory Visit | Attending: Cardiology | Admitting: Cardiology

## 2022-06-22 DIAGNOSIS — Z48812 Encounter for surgical aftercare following surgery on the circulatory system: Secondary | ICD-10-CM | POA: Insufficient documentation

## 2022-06-22 DIAGNOSIS — Z951 Presence of aortocoronary bypass graft: Secondary | ICD-10-CM | POA: Diagnosis present

## 2022-06-24 ENCOUNTER — Encounter (HOSPITAL_COMMUNITY)
Admission: RE | Admit: 2022-06-24 | Discharge: 2022-06-24 | Disposition: A | Discharge status: Home / Self Care | Payer: 59 | Source: Ambulatory Visit | Attending: Cardiology | Admitting: Cardiology

## 2022-06-24 DIAGNOSIS — Z951 Presence of aortocoronary bypass graft: Secondary | ICD-10-CM | POA: Diagnosis not present

## 2022-06-27 ENCOUNTER — Encounter (HOSPITAL_COMMUNITY): Payer: 59

## 2022-06-27 ENCOUNTER — Other Ambulatory Visit: Payer: Self-pay | Admitting: Physician Assistant

## 2022-06-29 ENCOUNTER — Encounter: Payer: Self-pay | Admitting: Cardiology

## 2022-06-29 ENCOUNTER — Encounter (HOSPITAL_COMMUNITY)
Admission: RE | Admit: 2022-06-29 | Discharge: 2022-06-29 | Disposition: A | Discharge status: Home / Self Care | Payer: 59 | Source: Ambulatory Visit | Attending: Cardiology | Admitting: Cardiology

## 2022-06-29 DIAGNOSIS — Z951 Presence of aortocoronary bypass graft: Secondary | ICD-10-CM | POA: Diagnosis not present

## 2022-06-29 MED ORDER — ATORVASTATIN CALCIUM 20 MG PO TABS: 20.0000 mg | ORAL_TABLET | Freq: Every day | ORAL | 3 refills | Status: DC

## 2022-06-30 ENCOUNTER — Telehealth: Payer: Self-pay

## 2022-06-30 NOTE — Progress Notes (Signed)
This encounter was created in error - please disregard.

## 2022-06-30 NOTE — Telephone Encounter (Signed)
Patient stated he had cardiac surgery follow-up with PA on 6/14. Dr. Servando Salina advised. She asked for patient to inform clinci of his job description and activity level. Message placed in MyChart.

## 2022-06-30 NOTE — Telephone Encounter (Signed)
-----   Message from Thomasene Ripple, DO sent at 06/30/2022 12:30 PM EDT ----- I see you have not had your follow-up appointment since the hospitalization with CT surgery.  Please make sure that scheduled.  After that appointment then we can talk about clearance

## 2022-07-01 ENCOUNTER — Encounter (HOSPITAL_COMMUNITY)
Admission: RE | Admit: 2022-07-01 | Discharge: 2022-07-01 | Disposition: A | Discharge status: Home / Self Care | Payer: 59 | Source: Ambulatory Visit | Attending: Cardiology | Admitting: Cardiology

## 2022-07-01 DIAGNOSIS — Z951 Presence of aortocoronary bypass graft: Secondary | ICD-10-CM | POA: Diagnosis not present

## 2022-07-04 ENCOUNTER — Encounter (HOSPITAL_COMMUNITY)
Admission: RE | Admit: 2022-07-04 | Discharge: 2022-07-04 | Disposition: A | Discharge status: Home / Self Care | Payer: 59 | Source: Ambulatory Visit | Attending: Cardiology | Admitting: Cardiology

## 2022-07-04 DIAGNOSIS — Z951 Presence of aortocoronary bypass graft: Secondary | ICD-10-CM | POA: Diagnosis not present

## 2022-07-06 ENCOUNTER — Encounter (HOSPITAL_COMMUNITY): Payer: 59

## 2022-07-06 ENCOUNTER — Telehealth (HOSPITAL_COMMUNITY): Payer: Self-pay | Admitting: Family Medicine

## 2022-07-08 ENCOUNTER — Encounter (HOSPITAL_COMMUNITY)
Admission: RE | Admit: 2022-07-08 | Discharge: 2022-07-08 | Disposition: A | Discharge status: Home / Self Care | Payer: 59 | Source: Ambulatory Visit | Attending: Cardiology | Admitting: Cardiology

## 2022-07-08 DIAGNOSIS — Z951 Presence of aortocoronary bypass graft: Secondary | ICD-10-CM | POA: Diagnosis not present

## 2022-07-11 ENCOUNTER — Encounter (HOSPITAL_COMMUNITY)
Admission: RE | Admit: 2022-07-11 | Discharge: 2022-07-11 | Disposition: A | Discharge status: Home / Self Care | Payer: 59 | Source: Ambulatory Visit | Attending: Cardiology | Admitting: Cardiology

## 2022-07-11 DIAGNOSIS — Z951 Presence of aortocoronary bypass graft: Secondary | ICD-10-CM

## 2022-07-12 NOTE — Progress Notes (Signed)
Cardiac Individual Treatment Plan  Patient Details  Name: John Delacruz MRN: [id number] Date of Birth: 04/10/1961 Referring Provider:   Flowsheet Row INTENSIVE CARDIAC REHAB ORIENT from 05/31/2022 in [place]  Referring Provider Berniece Salines, DO       Initial Encounter Date:  [place] from 05/31/2022 in [facility]  Date 05/31/22       Visit Diagnosis: 03/28/22 CABG x 4  Patient's Home Medications on Admission:  Current Outpatient Medications:    acetaminophen (TYLENOL) 500 MG tablet, Take 1-2 tablets (500-1,000 mg total) by mouth every 6 (six) hours as needed., Disp: 30 tablet, Rfl: 0   amLODipine (NORVASC) 10 MG tablet, Take 10 mg by mouth daily., Disp: , Rfl:    aspirin EC 81 MG tablet, Take 1 tablet (81 mg total) by mouth daily. Swallow whole., Disp: 150 tablet, Rfl: 2   atorvastatin (LIPITOR) 20 MG tablet, Take 1 tablet (20 mg total) by mouth daily., Disp: 90 tablet, Rfl: 3   bisacodyl (DULCOLAX) 5 MG EC tablet, Take 5 mg by mouth daily as needed for moderate constipation., Disp: , Rfl:    cyclobenzaprine (FLEXERIL) 10 MG tablet, Take 1 tablet (10 mg total) by mouth 2 (two) times daily as needed for muscle spasms., Disp: 20 tablet, Rfl: 0   hydrochlorothiazide (MICROZIDE) 12.5 MG capsule, Take 1 capsule (12.5 mg total) by mouth daily., Disp: 90 capsule, Rfl: 3   metoprolol succinate (TOPROL XL) 25 MG 24 hr tablet, Take 0.5 tablets (12.5 mg total) by mouth daily., Disp: 45 tablet, Rfl: 3   nitroGLYCERIN (NITROSTAT) 0.4 MG SL tablet, Place 0.4 mg under the tongue every 5 (five) minutes as needed for chest pain., Disp: , Rfl:    oxymetazoline (AFRIN) 0.05 % nasal spray, Place 1 spray into both nostrils 2 (two) times daily as needed for congestion., Disp: , Rfl:    traMADol (ULTRAM) 50 MG tablet, Take 1-2 tablets (50-100 mg total) by mouth every 4 (four) hours as needed for moderate  pain. (Patient not taking: Reported on 05/25/2022), Disp: 30 tablet, Rfl: 0   traZODone (DESYREL) 100 MG tablet, Take 50-100 mg by mouth at bedtime as needed for sleep. (Patient not taking: Reported on 06/13/2022), Disp: , Rfl:    valsartan (DIOVAN) 40 MG tablet, Take 1 tablet (40 mg total) by mouth daily., Disp: 90 tablet, Rfl: 3   VITAMIN D PO, Take 1 capsule by mouth daily., Disp: , Rfl:   Current Facility-Administered Medications:    nitroGLYCERIN (NITROSTAT) SL tablet 0.4 mg, 0.4 mg, Sublingual, Q5 min PRN, Belva Crome, MD  Past Medical History: Past Medical History:  Diagnosis Date   Anginal pain ([place]) 02/2022   Coronary artery disease    Hypertension     Tobacco Use: Social History   Tobacco Use  Smoking Status Former   Packs/day: 1.00   Years: 35.00   Total pack years: 35.00   Types: Cigarettes   Quit date: 03/27/2022   Years since quitting: 0.2  Smokeless Tobacco Never  Tobacco Comments   Quit on 03/27/22 and has not resumed smoking    Labs: Review Flowsheet       Latest Ref Rng & Units 03/24/2022 03/28/2022 05/26/2022  Labs for ITP Cardiac and Pulmonary Rehab  Cholestrol 100 - 199 mg/dL - - 139   LDL (calc) 0 - 99 mg/dL - - 77   HDL-C >39 mg/dL - - 42   Trlycerides  0 - 149 mg/dL - - 259   Hemoglobin D6L 4.8 - 5.6 % 5.9  - -  PH, Arterial 7.35 - 7.45 7.44  7.360  7.344  7.383  7.480  7.417  7.343  7.369  -  PCO2 arterial 32 - 48 mmHg 38  43.2  42.9  38.8  36.4  39.9  44.5  51.7  -  Bicarbonate 20.0 - 28.0 mmol/L 25.8  24.3  23.4  23.6  27.1  25.8  24.2  29.8  -  TCO2 22 - 32 mmol/L - 26  25  25  24  28  28  25  27  25  27  31  27   -  Acid-base deficit 0.0 - 2.0 mmol/L - 1.0  2.0  2.0  2.0  -  O2 Saturation % 100  97  96  99  100  100  78  100  -    Capillary Blood Glucose: Lab Results  Component Value Date   GLUCAP 81 03/30/2022   GLUCAP 117 (H) 03/30/2022   GLUCAP 113 (H) 03/30/2022   GLUCAP 131 (H) 03/30/2022   GLUCAP 141 (H) 03/29/2022     Exercise  Target Goals: Exercise Program Goal: Individual exercise prescription set using results from initial 6 min walk test and THRR while considering  patient's activity barriers and safety.   Exercise Prescription Goal: Initial exercise prescription builds to 30-45 minutes a day of aerobic activity, 2-3 days per week.  Home exercise guidelines will be given to patient during program as part of exercise prescription that the participant will acknowledge.  Activity Barriers & Risk Stratification:  Activity Barriers & Cardiac Risk Stratification - 05/31/22 1027       Activity Barriers & Cardiac Risk Stratification   Activity Barriers Arthritis;Other (comment)    Comments Prior left wrist surgery, arthritis- back and knee.    Cardiac Risk Stratification Moderate             6 Minute Walk:  6 Minute Walk     Row Name 05/31/22 1011         6 Minute Walk   Phase Initial     Distance 1416 feet     Walk Time 6 minutes     # of Rest Breaks 0     MPH 2.68     METS 3.33     RPE 7     Perceived Dyspnea  0     VO2 Peak 11.66     Symptoms No     Resting HR 66 bpm     Resting BP 101/68     Resting Oxygen Saturation  98 %     Exercise Oxygen Saturation  during 6 min walk 98 %     Max Ex. HR 74 bpm     Max Ex. BP 125/77     2 Minute Post BP 109/70              Oxygen Initial Assessment:   Oxygen Re-Evaluation:   Oxygen Discharge (Final Oxygen Re-Evaluation):   Initial Exercise Prescription:  Initial Exercise Prescription - 05/31/22 1000       Date of Initial Exercise RX and Referring Provider   Date 05/31/22    Referring Provider 08/01/22, DO    Expected Discharge Date 07/29/22      Recumbant Bike   Level 2    Watts 25    Minutes 15    METs 2.9  NuStep   Level 3    SPM 85    Minutes 15    METs 2.8      Prescription Details   Frequency (times per week) 3    Duration Progress to 30 minutes of continuous aerobic without signs/symptoms of physical  distress      Intensity   THRR 40-80% of Max Heartrate 63-126    Ratings of Perceived Exertion 11-13    Perceived Dyspnea 0-4      Progression   Progression Continue to progress workloads to maintain intensity without signs/symptoms of physical distress.      Resistance Training   Training Prescription Yes    Weight 5 lbs    Reps 10-15             Perform Capillary Blood Glucose checks as needed.  Exercise Prescription Changes:   Exercise Prescription Changes     Row Name 06/06/22 1029 06/13/22 1030 06/29/22 1049 07/11/22 1029       Response to Exercise   Blood Pressure (Admit) 112/58 108/56 112/60 108/62    Blood Pressure (Exercise) 126/70 116/68 140/78 120/70    Blood Pressure (Exit) 87/52  116/72 recheck after water, asymptomatic 102/56 123/73 110/52    Heart Rate (Admit) 58 bpm 54 bpm 66 bpm 60 bpm    Heart Rate (Exercise) 68 bpm 78 bpm 88 bpm 87 bpm    Heart Rate (Exit) 63 bpm 54 bpm 63 bpm 60 bpm    Rating of Perceived Exertion (Exercise) 12 11.5 12 12     Symptoms None None None None    Comments Off to a good start with exercise. INC WL on RB today. -- --    Duration Continue with 30 min of aerobic exercise without signs/symptoms of physical distress. Continue with 30 min of aerobic exercise without signs/symptoms of physical distress. Continue with 30 min of aerobic exercise without signs/symptoms of physical distress. Continue with 30 min of aerobic exercise without signs/symptoms of physical distress.    Intensity THRR unchanged THRR unchanged THRR unchanged THRR unchanged      Progression   Progression Continue to progress workloads to maintain intensity without signs/symptoms of physical distress. Continue to progress workloads to maintain intensity without signs/symptoms of physical distress. Continue to progress workloads to maintain intensity without signs/symptoms of physical distress. Continue to progress workloads to maintain intensity without  signs/symptoms of physical distress.    Average METs 2.1 2.6 3.2 2.8      Resistance Training   Training Prescription Yes Yes No  Relaxation day, no weights. Yes    Weight 5 lbs 6 lbs -- 7 lbs    Reps 10-15 10-15 -- 10-15    Time 10 Minutes 10 Minutes -- 10 Minutes      Interval Training   Interval Training No No No No      Recumbant Bike   Level 2 3 3 4     Watts 27 -- -- --    Minutes 15 15 15 15     METs 2.2 2.4 3 2.7      NuStep   Level 3 3 4 4     SPM 64 85 85 85    Minutes 15 15 15 15     METs 2 2.8 3.4 2.9      Home Exercise Plan   Plans to continue exercise at -- [employer] (comment)  Walking, stationary bike and weights at gym. [employer] (comment)  Walking, stationary bike and [employer] at gym. [employer] (  comment)  Walking, stationary bike and weights at gym.    Frequency -- Add 4 additional days to program exercise sessions. Add 4 additional days to program exercise sessions. Add 4 additional days to program exercise sessions.    Initial Home Exercises Provided -- 06/13/22 06/13/22 06/13/22             Exercise Comments:   Exercise Comments     Row Name 06/06/22 1130 06/13/22 1115 06/20/22 1043 06/29/22 1049 07/11/22 1050   Exercise Comments Patient tolerated low intensity exercise well without symptoms. Reviewed METs and goals with patient. Discussed current exericse routine. Will review home exercise guidelines in the coming sessions. Reviewed home exercise guidelines and goals. Reviewed METs and goals. Reviewed METs and goals with patient.            Exercise Goals and Review:   Exercise Goals     Row Name 05/31/22 0951             Exercise Goals   Increase Physical Activity Yes       Intervention Provide advice, education, support and counseling about physical activity/exercise needs.;Develop an individualized exercise prescription for aerobic and resistive training based on initial evaluation findings, risk stratification,  comorbidities and participant's personal goals.       Expected Outcomes Short Term: Attend rehab on a regular basis to increase amount of physical activity.;Long Term: Exercising regularly at least 3-5 days a week.;Long Term: Add in home exercise to make exercise part of routine and to increase amount of physical activity.       Increase Strength and Stamina Yes       Intervention Provide advice, education, support and counseling about physical activity/exercise needs.;Develop an individualized exercise prescription for aerobic and resistive training based on initial evaluation findings, risk stratification, comorbidities and participant's personal goals.       Expected Outcomes Short Term: Increase workloads from initial exercise prescription for resistance, speed, and METs.;Short Term: Perform resistance training exercises routinely during rehab and add in resistance training at home;Long Term: Improve cardiorespiratory fitness, muscular endurance and strength as measured by increased METs and functional capacity (6MWT)       Able to understand and use rate of perceived exertion (RPE) scale Yes       Intervention Provide education and explanation on how to use RPE scale       Expected Outcomes Short Term: Able to use RPE daily in rehab to express subjective intensity level;Long Term:  Able to use RPE to guide intensity level when exercising independently       Knowledge and understanding of Target Heart Rate Range (THRR) Yes       Intervention Provide education and explanation of THRR including how the numbers were predicted and where they are located for reference       Expected Outcomes Short Term: Able to state/look up THRR;Long Term: Able to use THRR to govern intensity when exercising independently;Short Term: Able to use daily as guideline for intensity in rehab       Able to check pulse independently Yes       Intervention Provide education and demonstration on how to check pulse in carotid and  radial arteries.;Review the importance of being able to check your own pulse for safety during independent exercise       Expected Outcomes Short Term: Able to explain why pulse checking is important during independent exercise;Long Term: Able to check pulse independently and accurately  Understanding of Exercise Prescription Yes       Intervention Provide education, explanation, and written materials on patient's individual exercise prescription       Expected Outcomes Short Term: Able to explain program exercise prescription;Long Term: Able to explain home exercise prescription to exercise independently                Exercise Goals Re-Evaluation :  Exercise Goals Re-Evaluation     Row Name 06/06/22 1130 06/13/22 1115 06/20/22 1043 06/29/22 1049 07/11/22 1050     Exercise Goal Re-Evaluation   Exercise Goals Review Increase Physical Activity;Able to understand and use rate of perceived exertion (RPE) scale;Increase Strength and Stamina Increase Physical Activity;Able to understand and use rate of perceived exertion (RPE) scale;Increase Strength and Stamina Increase Physical Activity;Able to understand and use rate of perceived exertion (RPE) scale;Increase Strength and Stamina;Understanding of Exercise Prescription;Knowledge and understanding of Target Heart Rate Range (THRR) Increase Physical Activity;Able to understand and use rate of perceived exertion (RPE) scale;Increase Strength and Stamina;Understanding of Exercise Prescription;Knowledge and understanding of Target Heart Rate Range (THRR) Increase Physical Activity;Able to understand and use rate of perceived exertion (RPE) scale;Increase Strength and Stamina;Understanding of Exercise Prescription;Knowledge and understanding of Target Heart Rate Range (THRR)   Comments Off to a good start with exercise. Patient is making good progress with exercise at cardiac rehab. Patient is walking 12-19 minutes daily, riding stationary bike 2-3  miles, 21-28 minutes at fitness center, and weight lifting at home and at the gym. Patient's goal is to increase strength. Patient is currently walking and/or riding stationary bike at local fitness daily as his mode of home exercise. Patient is also lifting weights at home home and at the gym. Patient's goal is to return to work without issues. Patient works in Clinical cyt[employer]ist. Patient is still restricted to 20 lbs for lifting but has been given the OK to mow and do light chores. Reviewed how to manually check pulse. Patient continues to make excellent progress with exericse. Will increase workload on the recumbent bike from 3 to 4 and increase handweights from 6lbs to 7lbs to help increase strength and stamina. Patient feels likr he's somewhat back to being able to do his work activities. Patient is making steady progress with exercise. Patient is tired today after walking 34 minutes this weekend. Patient is walking at least 20 minutes dailly and tolerating well. Patient has been cleared to return to work.   Expected Outcomes Progress workloads as tolerated to help increase cardiorespiratory fitness. Patient will continue current daily exercise routine to help increase strength. Continue daily exercise routine. Progress workloads as tolerated to help increase strength and stamina, so that patient may return to work activities once cleared by physician to do so. Continue to progress workloads to increase strength and stamina. Continue daily exercise routine.            Discharge Exercise Prescription (Final Exercise Prescription Changes):  Exercise Prescription Changes - 07/11/22 1029       Response to Exercise   Blood Pressure (Admit) 108/62    Blood Pressure (Exercise) 120/70    Blood Pressure (Exit) 110/52    Heart Rate (Admit) 60 bpm    Heart Rate (Exercise) 87 bpm    Heart Rate (Exit) 60 bpm    Rating of Perceived Exertion (Exercise) 12    Symptoms None    Duration Continue  with 30 min of aerobic exercise without signs/symptoms of physical distress.    Intensity THRR unchanged  Progression   Progression Continue to progress workloads to maintain intensity without signs/symptoms of physical distress.    Average METs 2.8      Resistance Training   Training Prescription Yes    Weight 7 lbs    Reps 10-15    Time 10 Minutes      Interval Training   Interval Training No      Recumbant Bike   Level 4    Minutes 15    METs 2.7      NuStep   Level 4    SPM 85    Minutes 15    METs 2.9      Home Exercise Plan   Plans to continue exercise at [employer] (comment)   Walking, stationary bike and weights at gym.   Frequency Add 4 additional days to program exercise sessions.    Initial Home Exercises Provided 06/13/22             Nutrition:  Target Goals: Understanding of nutrition guidelines, daily intake of sodium 1500mg , cholesterol 200mg , calories 30% from fat and 7% or less from saturated fats, daily to have 5 or more servings of fruits and vegetables.  Biometrics:  Pre Biometrics - 05/31/22 0943       Pre Biometrics   Waist Circumference 39.25 inches    Hip Circumference 40.75 inches    Waist to Hip Ratio 0.96 %    Triceps Skinfold 17 mm    % Body Fat 27.1 %    Grip Strength 52 kg    Flexibility 0 in   Knees bent   Single Leg Stand 30 seconds              Nutrition Therapy Plan and Nutrition Goals:  Nutrition Therapy & Goals - 07/06/22 1640       Nutrition Therapy   Diet Heart Healthy diet    Drug/Food Interactions Statins/Certain Fruits      Personal Nutrition Goals   Nutrition Goal Patient to choose a daily variety of fruits, vegetables, whole grains, nonfat dairy, lean protein/plant protein as part of heart healthy lifestyle    Personal Goal #2 Patient to limit to 1500mg  of sodium per day.    Personal Goal #3 Patient to identify and limit food souces of saturated fat, trans fat, sodium, and refined  carbohydrates.    Comments Goals in progress. Patient continue Pritikin education classes and continues to monitor sodium intake, reduce saturated fat, and increase high fiber foods.      Intervention Plan   Intervention Prescribe, educate and counsel regarding individualized specific dietary modifications aiming towards targeted core components such as weight, hypertension, lipid management, diabetes, heart failure and other comorbidities.    Expected Outcomes Short Term Goal: Understand basic principles of dietary content, such as calories, fat, sodium, cholesterol and nutrients.;Long Term Goal: Adherence to prescribed nutrition plan.             Nutrition Assessments:  Nutrition Assessments - 06/07/22 0845       Rate Your Plate Scores   Pre Score 53            MEDIFICTS Score Key: ?70 Need to make dietary changes  40-70 Heart Healthy Diet ? 40 Therapeutic Level Cholesterol Diet   Flowsheet Row INTENSIVE CARDIAC REHAB from 06/06/2022 in [facility]  Picture Your Plate Total Score on Admission 53      Picture Your Plate Scores: [id number] Unhealthy dietary pattern with much room for  improvement. 41-50 Dietary pattern unlikely to meet recommendations for good health and room for improvement. 51-60 More healthful dietary pattern, with some room for improvement.  >60 Healthy dietary pattern, although there may be some specific behaviors that could be improved.    Nutrition Goals Re-Evaluation:  Nutrition Goals Re-Evaluation     [place] Name 06/06/22 1548 07/06/22 1640           Goals   Current Weight 181 lb 7 oz (82.3 kg) 188 lb 4.4 oz (85.4 kg)      Comment Low RBC 3.8, low Hemoglobin 10.5, low hematocrit 31.7 No new labs at this time- Lipid panel WNL. Patient is up 6.8# since orientation date; BMI 27.8.      Expected Outcome -- Goals in progress. Patient continue Pritikin education classes and continues to monitor sodium intake, reduce saturated  fat, and increase high fiber foods.               Nutrition Goals Re-Evaluation:  Nutrition Goals Re-Evaluation     [place] Name 06/06/22 1548 07/06/22 1640           Goals   Current Weight 181 lb 7 oz (82.3 kg) 188 lb 4.4 oz (85.4 kg)      Comment Low RBC 3.8, low Hemoglobin 10.5, low hematocrit 31.7 No new labs at this time- Lipid panel WNL. Patient is up 6.8# since orientation date; BMI 27.8.      Expected Outcome -- Goals in progress. Patient continue Pritikin education classes and continues to monitor sodium intake, reduce saturated fat, and increase high fiber foods.               Nutrition Goals Discharge (Final Nutrition Goals Re-Evaluation):  Nutrition Goals Re-Evaluation - 07/06/22 1640       Goals   Current Weight 188 lb 4.4 oz (85.4 kg)    Comment No new labs at this time- Lipid panel WNL. Patient is up 6.8# since orientation date; BMI 27.8.    Expected Outcome Goals in progress. Patient continue Pritikin education classes and continues to monitor sodium intake, reduce saturated fat, and increase high fiber foods.             Psychosocial: Target Goals: Acknowledge presence or absence of significant depression and/or stress, maximize coping skills, provide positive support system. Participant is able to verbalize types and ability to use techniques and skills needed for reducing stress and depression.  Initial Review & Psychosocial Screening:  Initial Psych Review & Screening - 05/31/22 1201       Initial Review   Current issues with None Identified      Family Dynamics   Good Support System? Yes   John Delacruz has his spouse for support     Screening Interventions   Interventions Encouraged to exercise             Quality of Life Scores:  Quality of Life - 05/31/22 1118       Quality of Life   Select Quality of Life      Quality of Life Scores   Health/Function Pre 22.57 %    Socioeconomic Pre 22.29 %    Psych/Spiritual Pre 24.64 %    Family  Pre 26.1 %    GLOBAL Pre 23.46 %            Scores of 19 and below usually indicate a poorer quality of life in these areas.  A difference of  2-3 points is a clinically meaningful difference.  A difference of 2-3 points in the total score of the Quality of Life Index has been associated with significant improvement in overall quality of life, self-image, physical symptoms, and general health in studies assessing change in quality of life.  PHQ-9: Review Flowsheet       05/31/2022  Depression screen PHQ 2/9  Decreased Interest 0  Down, Depressed, Hopeless 0  PHQ - 2 Score 0   Interpretation of Total Score  Total Score Depression Severity:  1-4 = Minimal depression, 5-9 = Mild depression, 10-14 = Moderate depression, 15-19 = Moderately severe depression, 20-27 = Severe depression   Psychosocial Evaluation and Intervention:   Psychosocial Re-Evaluation:  Psychosocial Re-Evaluation     Row Name 06/14/22 0825 07/12/22 1125           Psychosocial Re-Evaluation   Current issues with None Identified None Identified      Interventions Encouraged to attend Cardiac Rehabilitation for the exercise Encouraged to attend Cardiac Rehabilitation for the exercise      Continue Psychosocial Services  No Follow up required No Follow up required               Psychosocial Discharge (Final Psychosocial Re-Evaluation):  Psychosocial Re-Evaluation - 07/12/22 1125       Psychosocial Re-Evaluation   Current issues with None Identified    Interventions Encouraged to attend Cardiac Rehabilitation for the exercise    Continue Psychosocial Services  No Follow up required             Vocational Rehabilitation: Provide vocational rehab assistance to qualifying candidates.   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 Participant will state understanding/return demonstration of topics presented.    Education     Row Name 06/06/22 1300     Education   Cardiac Education Topics Edie   Environmental consultant Exercise   Exercise Workshop Exercise Basics: Building Your Action Plan   Instruction Review Code 1- Verbalizes Understanding   Class Start Time 1144   Class Stop Time 1236   Class Time Calculation (min) 52 min    [place] Name 06/08/22 1140     Education   Cardiac Education Topics Pritikin   Financial trader   Weekly Topic Powerhouse Plant-Based Proteins   Instruction Review Code 1- Verbalizes Understanding   Class Start Time 1140   Class Stop Time 1222   Class Time Calculation (min) 42 min    [place] Name 06/13/22 1300     Education   Cardiac Education Topics Pritikin   Charity fundraiser Exercise Physiologist   Select Nutrition   Nutrition Nutrition Action Plan   Instruction Review Code 1- Verbalizes Understanding   Class Start Time 1149   Class Stop Time 1225   Class Time Calculation (min) 36 min    [place] Name 06/20/22 [place]   [employer]  Workshops   Biomedical scientist Psychosocial   Psychosocial Workshop New Thoughts, New Behaviors   Instruction Review Code 1- Verbalizes Understanding   Class Start Time 1137   Class Stop Time 1225   Class Time Calculation (min) 48 min    Row Name 06/29/22 1300     Education   Cardiac Education Topics Pritikin   Customer service manager   Weekly Topic Fast Evening Meals   Instruction Review Code 1- Verbalizes  Understanding   Class Start Time 1137   Class Stop Time 1223   Class Time Calculation (min) 46 min    Row Name 07/04/22 1600     Education   Cardiac Education Topics Pritikin   Glass blower/designer Nutrition   Nutrition Workshop Fueling a Forensic psychologist   Instruction Review Code 1- Tax inspector   Class Start Time 1150   Class Stop Time 1240   Class Time Calculation (min) 50 min    Row Name 07/08/22 1200     Education   Cardiac Education Topics Pritikin   Engineer, mining Education   General Education Heart Disease Risk Reduction   Instruction Review Code 1- Verbalizes Understanding   Class Start Time 1145   Class Stop Time 1221   Class Time Calculation (min) 36 min    Row Name 07/11/22 1300     Education   Cardiac Education Topics Pritikin   [employer]     Workshops   Educator Exercise Physiologist   [facility]   Psychosocial Workshop Recognizing and Reducing Stress   Instruction Review Code 1- Verbalizes Understanding   Class Start Time 1150   Class Stop Time 1238   Class Time Calculation (min) 48 min            Core Videos: Exercise    Move It!  Clinical staff conducted group or individual video education with verbal and written material and guidebook.  Patient learns the recommended Pritikin exercise program. Exercise with the goal of living a long, healthy life. Some of the health benefits of exercise include controlled diabetes, healthier blood pressure levels, improved cholesterol levels, improved heart and lung capacity, improved sleep, and better body composition. Everyone should speak with their doctor before starting or changing an exercise routine.  Biomechanical Limitations Clinical staff conducted group or individual video education with verbal and written material and guidebook.  Patient learns how biomechanical limitations  can impact exercise and how we can mitigate and possibly overcome limitations to have an impactful and balanced exercise routine.  Body Composition Clinical staff conducted group or individual video education with verbal and written material and guidebook.  Patient learns that body composition (ratio of muscle mass to fat mass) is a key component to assessing overall fitness, rather than body weight alone. Increased fat mass, especially visceral belly fat, can put [place] at increased risk for metabolic syndrome, type 2 diabetes, heart disease, and even death. It is recommended to combine diet and exercise (cardiovascular and resistance training) to improve your body composition. Seek guidance from your physician and exercise physiologist before implementing an exercise routine.  Exercise Action Plan Clinical staff conducted group or individual video education with verbal and written material and guidebook.  Patient learns the recommended strategies to achieve and enjoy long-term exercise  adherence, including variety, self-motivation, self-efficacy, and positive decision making. Benefits of exercise include fitness, good health, weight management, more energy, better sleep, less stress, and overall well-being.  Medical   Heart Disease Risk Reduction Clinical staff conducted group or individual video education with verbal and written material and guidebook.  Patient learns our heart is our most vital organ as it circulates oxygen, nutrients, white blood cells, and hormones throughout the entire body, and carries waste away. Data supports a plant-based eating plan like the Pritikin Program for its effectiveness in slowing progression of and reversing heart disease. The video provides a number of recommendations to address heart disease.   Metabolic Syndrome and Belly Fat  Clinical staff conducted group or individual video education with verbal and written material and guidebook.  Patient learns what  metabolic syndrome is, how it leads to heart disease, and how one can reverse it and keep it from coming back. You have metabolic syndrome if you have 3 of the following 5 criteria: abdominal obesity, high blood pressure, high triglycerides, low HDL cholesterol, and high blood sugar.  Hypertension and Heart Disease Clinical staff conducted group or individual video education with verbal and written material and guidebook.  Patient learns that high blood pressure, or hypertension, is very common in the [place]. Hypertension is largely due to excessive salt intake, but other important risk factors include being overweight, physical inactivity, drinking too much alcohol, smoking, and not eating enough potassium from fruits and vegetables. High blood pressure is a leading risk factor for heart attack, stroke, congestive heart failure, dementia, kidney failure, and premature death. Long-term effects of excessive salt intake include stiffening of the arteries and thickening of heart muscle and organ damage. Recommendations include ways to reduce hypertension and the risk of heart disease.  Diseases of Our Time - Focusing on Diabetes Clinical staff conducted group or individual video education with verbal and written material and guidebook.  Patient learns why the best way to stop diseases of our time is prevention, through food and other lifestyle changes. Medicine (such as prescription pills and surgeries) is often only a Band-Aid on the problem, not a long-term solution. Most common diseases of our time include obesity, type 2 diabetes, hypertension, heart disease, and cancer. The Pritikin Program is recommended and has been proven to help reduce, reverse, and/or prevent the damaging effects of metabolic syndrome.  Nutrition   Overview of the Pritikin Eating Plan  Clinical staff conducted group or individual video education with verbal and written material and guidebook.  Patient learns about the  [place] for disease risk reduction. The [place] emphasizes a wide variety of unrefined, minimally-processed carbohydrates, like fruits, vegetables, whole grains, and legumes. Go, Caution, and Stop food choices are explained. Plant-based and lean animal proteins are emphasized. Rationale provided for low sodium intake for blood pressure control, low added sugars for blood sugar stabilization, and low added fats and oils for coronary artery disease risk reduction and weight management.  Calorie Density  Clinical staff conducted group or individual video education with verbal and written material and guidebook.  Patient learns about calorie density and how it impacts the Pritikin Eating Plan. Knowing the characteristics of the food you choose will help you decide whether those foods will lead to weight gain or weight loss, and whether you want to consume more or less of them. Weight loss is usually a side effect of the Pritikin Eating Plan because of its focus on low calorie-dense foods.  Label Reading  Clinical staff conducted group or individual video education with verbal and written material and guidebook.  Patient learns about the Pritikin recommended label reading guidelines and corresponding recommendations regarding calorie density, added sugars, sodium content, and whole grains.  Dining Out - Part 1  Clinical staff conducted group or individual video education with verbal and written material and guidebook.  Patient learns that restaurant meals can be sabotaging because they can be so high in calories, fat, sodium, and/or sugar. Patient learns recommended strategies on how to positively address this and avoid unhealthy pitfalls.  Facts on Fats  Clinical staff conducted group or individual video education with verbal and written material and guidebook.  Patient learns that lifestyle modifications can be just as effective, if not more so, as many medications for lowering  your risk of heart disease. A Pritikin lifestyle can help to reduce your risk of inflammation and atherosclerosis (cholesterol build-up, or plaque, in the artery walls). Lifestyle interventions such as dietary choices and physical activity address the cause of atherosclerosis. A review of the types of fats and their impact on blood cholesterol levels, along with dietary recommendations to reduce fat intake is also included.  Nutrition Action Plan  Clinical staff conducted group or individual video education with verbal and written material and guidebook.  Patient learns how to incorporate Pritikin recommendations into their lifestyle. Recommendations include planning and keeping personal health goals in mind as an important part of their success.  Healthy Mind-Set    Healthy Minds, Bodies, Hearts  Clinical staff conducted group or individual video education with verbal and written material and guidebook.  Patient learns how to identify when they are stressed. Video will discuss the impact of that stress, as well as the many benefits of stress management. Patient will also be introduced to stress management techniques. The way we think, act, and feel has an impact on our hearts.  How Our Thoughts Can Heal Our Hearts  Clinical staff conducted group or individual video education with verbal and written material and guidebook.  Patient learns that negative thoughts can cause depression and anxiety. This can result in negative lifestyle behavior and serious health problems. Cognitive behavioral therapy is an effective method to help control our thoughts in order to change and improve our emotional outlook.  Additional Videos:  Exercise    Improving Performance  Clinical staff conducted group or individual video education with verbal and written material and guidebook.  Patient learns to use a non-linear approach by alternating intensity levels and lengths of time spent exercising to help burn more  calories and lose more body fat. Cardiovascular exercise helps improve heart health, metabolism, hormonal balance, blood sugar control, and recovery from fatigue. Resistance training improves strength, endurance, balance, coordination, reaction time, metabolism, and muscle mass. Flexibility exercise improves circulation, posture, and balance. Seek guidance from your physician and exercise physiologist before implementing an exercise routine and learn your capabilities and proper form for all exercise.  Introduction to Yoga  Clinical staff conducted group or individual video education with verbal and written material and guidebook.  Patient learns about yoga, a discipline of the coming together of mind, breath, and body. The benefits of yoga include improved flexibility, improved range of motion, better posture and core strength, increased lung function, weight loss, and positive self-image. Yoga's heart health benefits include lowered blood pressure, healthier heart rate, decreased cholesterol and triglyceride levels, improved immune function, and reduced stress. Seek guidance from your physician and exercise physiologist before implementing  an exercise routine and learn your capabilities and proper form for all exercise.  Medical   Aging: Enhancing Your Quality of Life  Clinical staff conducted group or individual video education with verbal and written material and guidebook.  Patient learns key strategies and recommendations to stay in good physical health and enhance quality of life, such as prevention strategies, having an advocate, securing a [place], and keeping a list of medications and system for tracking them. It also discusses how to avoid risk for bone loss.  Biology of Weight Control  Clinical staff conducted group or individual video education with verbal and written material and guidebook.  Patient learns that weight gain occurs because we consume more  calories than we burn (eating more, moving less). Even if your body weight is normal, you may have higher ratios of fat compared to muscle mass. Too much body fat puts you at increased risk for cardiovascular disease, heart attack, stroke, type 2 diabetes, and obesity-related cancers. In addition to exercise, following the [place] can help reduce your risk.  Decoding Lab Results  Clinical staff conducted group or individual video education with verbal and written material and guidebook.  Patient learns that lab test reflects one measurement whose values change over time and are influenced by many factors, including medication, stress, sleep, exercise, food, hydration, pre-existing medical conditions, and more. It is recommended to use the knowledge from this video to become more involved with your lab results and evaluate your numbers to speak with your doctor.   Diseases of Our Time - Overview  Clinical staff conducted group or individual video education with verbal and written material and guidebook.  Patient learns that according to the CDC, 50% to 70% of chronic diseases (such as obesity, type 2 diabetes, elevated lipids, hypertension, and heart disease) are avoidable through lifestyle improvements including healthier food choices, listening to satiety cues, and increased physical activity.  Sleep Disorders Clinical staff conducted group or individual video education with verbal and written material and guidebook.  Patient learns how good quality and duration of sleep are important to overall health and well-being. Patient also learns about sleep disorders and how they impact health along with recommendations to address them, including discussing with a physician.  Nutrition  Dining Out - Part 2 Clinical staff conducted group or individual video education with verbal and written material and guidebook.  Patient learns how to plan ahead and communicate in order to maximize their  dining experience in a healthy and nutritious manner. Included are recommended food choices based on the type of restaurant the patient is visiting.   Fueling a Best boy conducted group or individual video education with verbal and written material and guidebook.  There is a strong connection between our food choices and our health. Diseases like obesity and type 2 diabetes are very prevalent and are in large-part due to lifestyle choices. The Pritikin Eating Plan provides plenty of food and hunger-curbing satisfaction. It is easy to follow, affordable, and helps reduce health risks.  Menu Workshop  Clinical staff conducted group or individual video education with verbal and written material and guidebook.  Patient learns that restaurant meals can sabotage health goals because they are often packed with calories, fat, sodium, and sugar. Recommendations include strategies to plan ahead and to communicate with the manager, chef, or server to help order a healthier meal.  Planning Your Eating Strategy  Clinical staff conducted group or individual  video education with verbal and written material and guidebook.  Patient learns about the Pritikin Eating Plan and its benefit of reducing the risk of disease. The Pritikin Eating Plan does not focus on calories. Instead, it emphasizes high-quality, nutrient-rich foods. By knowing the characteristics of the foods, we choose, we can determine their calorie density and make informed decisions.  Targeting Your Nutrition Priorities  Clinical staff conducted group or individual video education with verbal and written material and guidebook.  Patient learns that lifestyle habits have a tremendous impact on disease risk and progression. This video provides eating and physical activity recommendations based on your personal health goals, such as reducing LDL cholesterol, losing weight, preventing or controlling type 2 diabetes, and reducing high  blood pressure.  Vitamins and Minerals  Clinical staff conducted group or individual video education with verbal and written material and guidebook.  Patient learns different ways to obtain key vitamins and minerals, including through a recommended healthy diet. It is important to discuss all supplements you take with your doctor.   Healthy Mind-Set    Smoking Cessation  Clinical staff conducted group or individual video education with verbal and written material and guidebook.  Patient learns that cigarette smoking and tobacco addiction pose a serious health risk which affects millions of people. Stopping smoking will significantly reduce the risk of heart disease, lung disease, and many forms of cancer. Recommended strategies for quitting are covered, including working with your doctor to develop a successful plan.  Culinary   Becoming a Set designer conducted group or individual video education with verbal and written material and guidebook.  Patient learns that cooking at home can be healthy, cost-effective, quick, and puts them in control. Keys to cooking healthy recipes will include looking at your recipe, assessing your equipment needs, planning ahead, making it simple, choosing cost-effective seasonal ingredients, and limiting the use of added fats, salts, and sugars.  Cooking - Breakfast and Snacks  Clinical staff conducted group or individual video education with verbal and written material and guidebook.  Patient learns how important breakfast is to satiety and nutrition through the entire day. Recommendations include key foods to eat during breakfast to help stabilize blood sugar levels and to prevent overeating at meals later in the day. Planning ahead is also a key component.  Cooking - Educational psychologist conducted group or individual video education with verbal and written material and guidebook.  Patient learns eating strategies to improve overall  health, including an approach to cook more at home. Recommendations include thinking of animal protein as a side on your plate rather than center stage and focusing instead on lower calorie dense options like vegetables, fruits, whole grains, and plant-based proteins, such as beans. Making sauces in large quantities to freeze for later and leaving the skin on your vegetables are also recommended to maximize your experience.  Cooking - Healthy Salads and Dressing Clinical staff conducted group or individual video education with verbal and written material and guidebook.  Patient learns that vegetables, fruits, whole grains, and legumes are the foundations of the Pritikin Eating Plan. Recommendations include how to incorporate each of these in flavorful and healthy salads, and how to create homemade salad dressings. Proper handling of ingredients is also covered. Cooking - Soups and [employer] - Soups and Desserts Clinical staff conducted group or individual video education with verbal and written material and guidebook.  Patient learns that Pritikin soups and desserts make for easy,  nutritious, and delicious snacks and meal components that are low in sodium, fat, sugar, and calorie density, while high in vitamins, minerals, and filling fiber. Recommendations include simple and healthy ideas for soups and desserts.   Overview     The Pritikin Solution Program Overview Clinical staff conducted group or individual video education with verbal and written material and guidebook.  Patient learns that the results of the [place] Program have been documented in more than 100 articles published in peer-reviewed journals, and the benefits include reducing risk factors for (and, in some cases, even reversing) high cholesterol, high blood pressure, type 2 diabetes, obesity, and more! An overview of the three key pillars of the Pritikin Program will be covered: eating well, doing regular exercise, and having a  healthy mind-set.  WORKSHOPS  Exercise: Exercise Basics: Building Your Action Plan Clinical staff led group instruction and group discussion with PowerPoint presentation and patient guidebook. To enhance the learning environment the use of posters, models and videos may be added. At the conclusion of this workshop, patients will comprehend the difference between physical activity and exercise, as well as the benefits of incorporating both, into their routine. Patients will understand the FITT (Frequency, Intensity, Time, and Type) principle and how to use it to build an exercise action plan. In addition, safety concerns and other considerations for exercise and cardiac rehab will be addressed by the presenter. The purpose of this lesson is to promote a comprehensive and effective weekly exercise routine in order to improve patients' overall level of fitness.   Managing Heart Disease: Your Path to a Healthier Heart Clinical staff led group instruction and group discussion with PowerPoint presentation and patient guidebook. To enhance the learning environment the use of posters, models and videos may be added.At the conclusion of this workshop, patients will understand the anatomy and physiology of the heart. Additionally, they will understand how Pritikin's three pillars impact the risk factors, the progression, and the management of heart disease.  The purpose of this lesson is to provide a high-level overview of the heart, heart disease, and how the Pritikin lifestyle positively impacts risk factors.  Exercise Biomechanics Clinical staff led group instruction and group discussion with PowerPoint presentation and patient guidebook. To enhance the learning environment the use of posters, models and videos may be added. Patients will learn how the structural parts of their bodies function and how these functions impact their daily activities, movement, and exercise. Patients will learn how to  promote a neutral spine, learn how to manage pain, and identify ways to improve their physical movement in order to promote healthy living. The purpose of this lesson is to expose patients to common physical limitations that impact physical activity. Participants will learn practical ways to adapt and manage aches and pains, and to minimize their effect on regular exercise. Patients will learn how to maintain good posture while sitting, walking, and lifting.  Balance Training and Fall Prevention  Clinical staff led group instruction and group discussion with PowerPoint presentation and patient guidebook. To enhance the learning environment the use of posters, models and videos may be added. At the conclusion of this workshop, patients will understand the importance of their sensorimotor skills (vision, proprioception, and the vestibular system) in maintaining their ability to balance as they age. Patients will apply a variety of balancing exercises that are appropriate for their current level of function. Patients will understand the common causes for poor balance, possible solutions to these problems, and ways to modify their  physical environment in order to minimize their fall risk. The purpose of this lesson is to teach patients about the importance of maintaining balance as they age and ways to minimize their risk of falling.  WORKSHOPS   Nutrition:  Fueling a Scientist, research (physical sciences) led group instruction and group discussion with PowerPoint presentation and patient guidebook. To enhance the learning environment the use of posters, models and videos may be added. Patients will review the foundational principles of the [place] and understand what constitutes a serving size in each of the food groups. Patients will also learn Pritikin-friendly foods that are better choices when away from home and review make-ahead meal and snack options. Calorie density will be reviewed and  applied to three nutrition priorities: weight maintenance, weight loss, and weight gain. The purpose of this lesson is to reinforce (in a group setting) the key concepts around what patients are recommended to eat and how to apply these guidelines when away from home by planning and selecting Pritikin-friendly options. Patients will understand how calorie density may be adjusted for different weight management goals.  Mindful Eating  Clinical staff led group instruction and group discussion with PowerPoint presentation and patient guidebook. To enhance the learning environment the use of posters, models and videos may be added. Patients will briefly review the concepts of the [place] and the importance of low-calorie dense foods. The concept of mindful eating will be introduced as well as the importance of paying attention to internal hunger signals. Triggers for non-hunger eating and techniques for dealing with triggers will be explored. The purpose of this lesson is to provide patients with the opportunity to review the basic principles of the [place], discuss the value of eating mindfully and how to measure internal cues of hunger and fullness using the Hunger Scale. Patients will also discuss reasons for non-hunger eating and learn strategies to use for controlling emotional eating.  Targeting Your Nutrition Priorities Clinical staff led group instruction and group discussion with PowerPoint presentation and patient guidebook. To enhance the learning environment the use of posters, models and videos may be added. Patients will learn how to determine their genetic susceptibility to disease by reviewing their family history. Patients will gain insight into the importance of diet as part of an overall healthy lifestyle in mitigating the impact of genetics and other environmental insults. The purpose of this lesson is to provide patients with the opportunity to assess their personal  nutrition priorities by looking at their family history, their own health history and current risk factors. Patients will also be able to discuss ways of prioritizing and modifying the [place] for their highest risk areas  Menu  Clinical staff led group instruction and group discussion with PowerPoint presentation and patient guidebook. To enhance the learning environment the use of posters, models and videos may be added. Using menus brought in from [employer], or printed from [employer], patients will apply the [place] dining out guidelines that were presented in the [employer] video. Patients will also be able to practice these guidelines in a variety of provided scenarios. The purpose of this lesson is to provide patients with the opportunity to practice hands-on learning of the [place] with actual menus and practice scenarios.  Label Reading Clinical staff led group instruction and group discussion with PowerPoint presentation and patient guidebook. To enhance the learning environment the use of posters, models and videos may be added.  Patients will review and discuss the [facility] label reading guidelines presented in [facility]'s Label Reading Educational series video. Using fool labels brought in from local grocery stores and markets, patients will apply the label reading guidelines and determine if the packaged food meet the [facility] guidelines. The purpose of this lesson is to provide patients with the opportunity to review, discuss, and practice hands-on learning of the [facility] Label Reading guidelines with actual packaged food labels. [place] Workshops are designed to teach patients ways to prepare quick, simple, and affordable recipes at home. The importance of nutrition's role in chronic disease risk reduction is reflected in its emphasis in the overall [facility] program. By learning how to prepare  essential core [facility] Eating Plan recipes, patients will increase control over what they eat; be able to customize the flavor of foods without the use of added salt, sugar, or fat; and improve the quality of the food they consume. By learning a set of core recipes which are easily assembled, quickly prepared, and affordable, patients are more likely to prepare more healthy foods at home. These workshops focus on convenient breakfasts, simple entres, side dishes, and desserts which can be prepared with minimal effort and are consistent with nutrition recommendations for cardiovascular risk reduction. Cooking [employer] are taught by a Engineer, materials (RD) who has been trained by the [employer]. The chef or RD has a clear understanding of the importance of minimizing - if not completely eliminating - added fat, sugar, and sodium in recipes. Throughout the series of [place] Workshop sessions, patients will learn about healthy ingredients and efficient methods of cooking to build confidence in their capability to prepare    Cooking School weekly topics:  Adding Flavor- Sodium-Free  Fast and Healthy Breakfasts  Powerhouse Plant-Based Proteins  Satisfying Salads and Dressings  Simple Sides and Sauces  International Cuisine-Spotlight on the [place] Zones  Delicious Desserts  Savory Soups  Efficiency Cooking - Meals in a Snap  Tasty Appetizers and Snacks  Comforting Weekend Breakfasts  One-Pot Wonders   Fast Evening Meals  Easy [place] (Psychosocial): New Thoughts, New Behaviors Clinical staff led group instruction and group discussion with PowerPoint presentation and patient guidebook. To enhance the learning environment the use of posters, models and videos may be added. Patients will learn and practice techniques for developing effective health and lifestyle goals. Patients will be able  to effectively apply the goal setting process learned to develop at least one new personal goal.  The purpose of this lesson is to expose patients to a new skill set of behavior modification techniques such as techniques setting SMART goals, overcoming barriers, and achieving new thoughts and new behaviors.  Managing Moods and Relationships Clinical staff led group instruction and group discussion with PowerPoint presentation and patient guidebook. To enhance the learning environment the use of posters, models and videos may be added. Patients will learn how emotional and chronic stress factors can impact their health and relationships. They will learn healthy ways to manage their moods and utilize positive coping mechanisms. In addition, ICR patients will learn ways to improve communication skills. The purpose of this lesson is to expose patients to ways of understanding how one's mood and health are intimately connected. Developing a healthy outlook can help build positive relationships and connections with others. Patients will understand the importance of utilizing effective communication skills that include actively listening  and being heard. They will learn and understand the importance of the "4 Cs" and especially Connections in fostering of a Healthy Mind-Set.  Healthy Sleep for a Healthy Heart Clinical staff led group instruction and group discussion with PowerPoint presentation and patient guidebook. To enhance the learning environment the use of posters, models and videos may be added. At the conclusion of this workshop, patients will be able to demonstrate knowledge of the importance of sleep to overall health, well-being, and quality of life. They will understand the symptoms of, and treatments for, common sleep disorders. Patients will also be able to identify daytime and nighttime behaviors which impact sleep, and they will be able to apply these tools to help manage sleep-related challenges.  The purpose of this lesson is to provide patients with a general overview of sleep and outline the importance of quality sleep. Patients will learn about a few of the most common sleep disorders. Patients will also be introduced to the concept of "sleep hygiene," and discover ways to self-manage certain sleeping problems through simple daily behavior changes. Finally, the workshop will motivate patients by clarifying the links between quality sleep and their goals of heart-healthy living.   Recognizing and Reducing Stress Clinical staff led group instruction and group discussion with PowerPoint presentation and patient guidebook. To enhance the learning environment the use of posters, models and videos may be added. At the conclusion of this workshop, patients will be able to understand the types of stress reactions, differentiate between acute and chronic stress, and recognize the impact that chronic stress has on their health. They will also be able to apply different coping mechanisms, such as reframing negative self-talk. Patients will have the opportunity to practice a variety of stress management techniques, such as deep abdominal breathing, progressive muscle relaxation, and/or guided imagery.  The purpose of this lesson is to educate patients on the role of stress in their lives and to provide healthy techniques for coping with it.  Learning Barriers/Preferences:  Learning Barriers/Preferences - 05/31/22 1203       Learning Barriers/Preferences   Learning Barriers Sight   Wears glasses   Learning Preferences Audio;Skilled Demonstration;Verbal Instruction;Computer/Internet;Group Instruction;Video;Individual Instruction;Written Material;Pictoral             Education Topics:  Knowledge Questionnaire Score:  Knowledge Questionnaire Score - 05/31/22 1119       Knowledge Questionnaire Score   Pre Score 21/28             Core Components/Risk Factors/Patient Goals at Admission:   Personal Goals and Risk Factors at Admission - 05/31/22 0951       Core Components/Risk Factors/Patient Goals on Admission   Tobacco Cessation Yes   Quit on 03/27/2022   Number of packs per day 1    Intervention Assist the participant in steps to quit. Provide individualized education and counseling about committing to Tobacco Cessation, relapse prevention, and pharmacological support that can be provided by physician.    Expected Outcomes Long Term: Complete abstinence from all tobacco products for at least 12 months from quit date.;Short Term: Will quit all tobacco product use, adhering to prevention of relapse plan.    Hypertension Yes    Intervention Provide education on lifestyle modifcations including regular physical activity/exercise, weight management, moderate sodium restriction and increased consumption of fresh fruit, vegetables, and low fat dairy, alcohol moderation, and smoking cessation.;Monitor prescription use compliance.    Expected Outcomes Long Term: Maintenance of blood pressure at goal levels.    Lipids Yes  Intervention Provide education and support for participant on nutrition & aerobic/resistive exercise along with prescribed medications to achieve LDL 70mg , HDL >40mg .    Expected Outcomes Short Term: Participant states understanding of desired cholesterol values and is compliant with medications prescribed. Participant is following exercise prescription and nutrition guidelines.;Long Term: Cholesterol controlled with medications as prescribed, with individualized exercise RX and with personalized nutrition plan. Value goals: LDL < 70mg , HDL > 40 mg.             Core Components/Risk Factors/Patient Goals Review:   Goals and Risk Factor Review     Row Name 06/14/22 0826 07/12/22 1125           Core Components/Risk Factors/Patient Goals Review   Personal Goals Review Tobacco Cessation;Hypertension;Lipids Tobacco Cessation;Hypertension;Lipids      Review John Delacruz has  good attendance and participation in phase intensive  cardiac rehab. John Delacruz continues to abstain from smoking John Delacruz has good attendance and participation in phase intensive  cardiac rehab. John Delacruz continues to abstain from smoking. John Delacruz has gained weight since starting the program which is goal for him      Expected Outcomes John Delacruz will have good attendance and participation in intensive cardiac rehab. John Delacruz will continue to participate in intensive cardiac rehab for exercise, nutrition and lifesstyle modifications               Core Components/Risk Factors/Patient Goals at Discharge (Final Review):   Goals and Risk Factor Review - 07/12/22 1125       Core Components/Risk Factors/Patient Goals Review   Personal Goals Review Tobacco Cessation;Hypertension;Lipids    Review John Delacruz has good attendance and participation in phase intensive  cardiac rehab. John Delacruz continues to abstain from smoking. John Delacruz has gained weight since starting the program which is goal for him    Expected Outcomes John Delacruz will continue to participate in intensive cardiac rehab for exercise, nutrition and lifesstyle modifications             ITP Comments:  ITP Comments     Row Name 05/31/22 0943 06/14/22 0823 07/12/22 1123       ITP Comments Medical Director- [name], MD. Introduction to [facility] Education/ Intensvie Cardiac Rehab. Initial [facility] Orientation Packet Reviewed with the patient 30 Day ITP Review. John Delacruz is off to a good start to exercise at [place] 30 Day ITP Review. John Delacruz has good attendance and participation in intensvie cardiac rehab              Comments: See ITP Comments

## 2022-07-13 ENCOUNTER — Encounter (HOSPITAL_COMMUNITY)
Admission: RE | Admit: 2022-07-13 | Discharge: 2022-07-13 | Disposition: A | Discharge status: Home / Self Care | Payer: 59 | Source: Ambulatory Visit | Attending: Cardiology | Admitting: Cardiology

## 2022-07-13 DIAGNOSIS — Z951 Presence of aortocoronary bypass graft: Secondary | ICD-10-CM

## 2022-07-15 ENCOUNTER — Encounter (HOSPITAL_COMMUNITY)
Admission: RE | Admit: 2022-07-15 | Discharge: 2022-07-15 | Disposition: A | Discharge status: Home / Self Care | Payer: 59 | Source: Ambulatory Visit | Attending: Cardiology | Admitting: Cardiology

## 2022-07-15 DIAGNOSIS — Z951 Presence of aortocoronary bypass graft: Secondary | ICD-10-CM

## 2022-07-18 ENCOUNTER — Encounter (HOSPITAL_COMMUNITY)
Admission: RE | Admit: 2022-07-18 | Discharge: 2022-07-18 | Disposition: A | Discharge status: Home / Self Care | Payer: 59 | Source: Ambulatory Visit | Attending: Cardiology | Admitting: Cardiology

## 2022-07-18 VITALS — BP 98/68 | HR 62 | Wt 191.4 lb

## 2022-07-18 DIAGNOSIS — Z951 Presence of aortocoronary bypass graft: Secondary | ICD-10-CM | POA: Diagnosis not present

## 2022-07-20 ENCOUNTER — Encounter (HOSPITAL_COMMUNITY): Payer: 59

## 2022-07-22 ENCOUNTER — Encounter (HOSPITAL_COMMUNITY)
Admission: RE | Admit: 2022-07-22 | Discharge: 2022-07-22 | Disposition: A | Discharge status: Home / Self Care | Payer: 59 | Source: Ambulatory Visit | Attending: Cardiology | Admitting: Cardiology

## 2022-07-22 DIAGNOSIS — Z951 Presence of aortocoronary bypass graft: Secondary | ICD-10-CM | POA: Insufficient documentation

## 2022-07-22 NOTE — Progress Notes (Signed)
Discharge Progress Report  Patient Details  Name: John Delacruz MRN: [id number] Date of Birth: 09-05-1961 Referring Provider:   Flowsheet Row INTENSIVE CARDIAC REHAB ORIENT from 05/31/2022 in [place]  Referring Provider Berniece Salines, DO        Number of Visits: 17  Reason for Discharge:  Patient reached a stable level of exercise. Patient independent in their exercise. Patient has met program and personal goals.  Smoking History:  Social History   Tobacco Use  Smoking Status Former   Packs/day: 1.00   Years: 35.00   Total pack years: 35.00   Types: Cigarettes   Quit date: 03/27/2022   Years since quitting: 0.3  Smokeless Tobacco Never  Tobacco Comments   Quit on 03/27/22 and has not resumed smoking    Diagnosis:  03/28/22 CABG x 4  ADL [facility]:   Initial Exercise Prescription:  Initial Exercise Prescription - 05/31/22 1000       Date of Initial Exercise RX and Referring Provider   Date 05/31/22    Referring Provider Berniece Salines, DO    Expected Discharge Date 07/29/22      Recumbant Bike   Level 2    Watts 25    Minutes 15    METs 2.9      NuStep   Level 3    SPM 85    Minutes 15    METs 2.8      Prescription Details   Frequency (times per week) 3    Duration Progress to 30 minutes of continuous aerobic without signs/symptoms of physical distress      Intensity   THRR 40-80% of Max Heartrate 63-126    Ratings of Perceived Exertion 11-13    Perceived Dyspnea 0-4      Progression   Progression Continue to progress workloads to maintain intensity without signs/symptoms of physical distress.      Resistance Training   Training Prescription Yes    Weight 5 lbs    Reps 10-15             Discharge Exercise Prescription (Final Exercise Prescription Changes):  Exercise Prescription Changes - 07/18/22 1028       Response to Exercise   Blood Pressure (Admit) 98/68    Blood Pressure (Exercise) 130/64    Blood  Pressure (Exit) 98/64    Heart Rate (Admit) 62 bpm    Heart Rate (Exercise) 97 bpm    Heart Rate (Exit) 61 bpm    Rating of Perceived Exertion (Exercise) 12    Symptoms None    Duration Continue with 30 min of aerobic exercise without signs/symptoms of physical distress.    Intensity THRR unchanged      Progression   Progression Continue to progress workloads to maintain intensity without signs/symptoms of physical distress.    Average METs 3.2      Resistance Training   Training Prescription Yes    Weight 8 lbs    Reps 10-15    Time 10 Minutes      Interval Training   Interval Training No      Recumbant Bike   Level 4    Minutes 15    METs 2.7      NuStep   Level 4    SPM 85    Minutes 15    METs 3.7      Home Exercise Plan   Plans to continue exercise at [employer] (comment)   Walking, stationary bike and  weights at gym.   Frequency Add 4 additional days to program exercise sessions.    Initial Home Exercises Provided 06/13/22             Functional Capacity:  6 Minute Walk     Row Name 05/31/22 1011         6 Minute Walk   Phase Initial     Distance 1416 feet     Walk Time 6 minutes     # of Rest Breaks 0     MPH 2.68     METS 3.33     RPE 7     Perceived Dyspnea  0     VO2 Peak 11.66     Symptoms No     Resting HR 66 bpm     Resting BP 101/68     Resting Oxygen Saturation  98 %     Exercise Oxygen Saturation  during 6 min walk 98 %     Max Ex. HR 74 bpm     Max Ex. BP 125/77     2 Minute Post BP 109/70              Psychological, QOL, Others - Outcomes: PHQ 2/9:    07/22/2022   10:36 AM 05/31/2022   12:05 PM  Depression screen PHQ 2/9  Decreased Interest 0 0  Down, Depressed, Hopeless 0 0  PHQ - 2 Score 0 0    Quality of Life:  Quality of Life - 07/18/22 1635       Quality of Life   Select Quality of Life      Quality of Life Scores   Health/Function Pre 22.57 %    Health/Function Post 26.4 %    Health/Function  % Change 16.97 %    Socioeconomic Pre 22.29 %    Socioeconomic Post 23.44 %    Socioeconomic % Change  5.16 %    Psych/Spiritual Pre 24.64 %    Psych/Spiritual Post 24.86 %    Psych/Spiritual % Change 0.89 %    Family Pre 26.1 %    Family Post 26.4 %    Family % Change 1.15 %    GLOBAL Pre 23.46 %    GLOBAL Post 25.41 %    GLOBAL % Change 8.31 %             Personal Goals: Goals established at orientation with interventions provided to work toward goal.  Personal Goals and Risk Factors at Admission - 05/31/22 0951       Core Components/Risk Factors/Patient Goals on Admission   Tobacco Cessation Yes   Quit on 03/27/2022   Number of packs per day 1    Intervention Assist the participant in steps to quit. Provide individualized education and counseling about committing to Tobacco Cessation, relapse prevention, and pharmacological support that can be provided by physician.    Expected Outcomes Long Term: Complete abstinence from all tobacco products for at least 12 months from quit date.;Short Term: Will quit all tobacco product use, adhering to prevention of relapse plan.    Hypertension Yes    Intervention Provide education on lifestyle modifcations including regular physical activity/exercise, weight management, moderate sodium restriction and increased consumption of fresh fruit, vegetables, and low fat dairy, alcohol moderation, and smoking cessation.;Monitor prescription use compliance.    Expected Outcomes Long Term: Maintenance of blood pressure at goal levels.    Lipids Yes    Intervention Provide education and support for participant on nutrition &  aerobic/resistive exercise along with prescribed medications to achieve LDL <86m, HDL >463m    Expected Outcomes Short Term: Participant states understanding of desired cholesterol values and is compliant with medications prescribed. Participant is following exercise prescription and nutrition guidelines.;Long Term: Cholesterol  controlled with medications as prescribed, with individualized exercise RX and with personalized nutrition plan. Value goals: LDL < 7024mHDL > 40 mg.              Personal Goals Discharge:  Goals and Risk Factor Review     Row Name 06/14/22 0826 07/12/22 1125 07/29/22 1447         Core Components/Risk Factors/Patient Goals Review   Personal Goals Review Tobacco Cessation;Hypertension;Lipids Tobacco Cessation;Hypertension;Lipids Tobacco Cessation;Hypertension;Lipids     Review TonNicole Kindreds good attendance and participation in phase intensive  cardiac rehab. TonNicole Kindredntinues to abstain from smoking TonNicole Kindreds good attendance and participation in phase intensive  cardiac rehab. TonNicole Kindredntinues to abstain from smoking. TonNicole Kindreds gained weight since starting the program which is goal for him TonNicole Kindreds good attendance and participation in phase intensive  cardiac rehab. TonNicole Kindredntinues to abstain from smoking. TonNicole Kindreds gained weight since starting the program which is goal for him. TonNicole Kindreds not able to complete intensive cardiac rehab as he changed insurance plans.     Expected Outcomes TonNicole Kindredll have good attendance and participation in intensive cardiac rehab. TonNicole Kindredll continue to participate in intensive cardiac rehab for exercise, nutrition and lifesstyle modifications TonNicole Kindredll continue to exercise,  follow nutrition and lifesstyle modifications after cardiac rehab.              Exercise Goals and Review:  Exercise Goals     Row Name 05/31/22 0951             Exercise Goals   Increase Physical Activity Yes       Intervention Provide advice, education, support and counseling about physical activity/exercise needs.;Develop an individualized exercise prescription for aerobic and resistive training based on initial evaluation findings, risk stratification, comorbidities and participant's personal goals.       Expected Outcomes Short Term: Attend rehab on a regular basis to increase amount of  physical activity.;Long Term: Exercising regularly at least 3-5 days a week.;Long Term: Add in home exercise to make exercise part of routine and to increase amount of physical activity.       Increase Strength and Stamina Yes       Intervention Provide advice, education, support and counseling about physical activity/exercise needs.;Develop an individualized exercise prescription for aerobic and resistive training based on initial evaluation findings, risk stratification, comorbidities and participant's personal goals.       Expected Outcomes Short Term: Increase workloads from initial exercise prescription for resistance, speed, and METs.;Short Term: Perform resistance training exercises routinely during rehab and add in resistance training at home;Long Term: Improve cardiorespiratory fitness, muscular endurance and strength as measured by increased METs and functional capacity (6MWT)       Able to understand and use rate of perceived exertion (RPE) scale Yes       Intervention Provide education and explanation on how to use RPE scale       Expected Outcomes Short Term: Able to use RPE daily in rehab to express subjective intensity level;Long Term:  Able to use RPE to guide intensity level when exercising independently       Knowledge and understanding of Target Heart Rate Range (THRR) Yes       Intervention  Provide education and explanation of THRR including how the numbers were predicted and where they are located for reference       Expected Outcomes Short Term: Able to state/look up THRR;Long Term: Able to use THRR to govern intensity when exercising independently;Short Term: Able to use daily as guideline for intensity in rehab       Able to check pulse independently Yes       Intervention Provide education and demonstration on how to check pulse in carotid and radial arteries.;Review the importance of being able to check your own pulse for safety during independent exercise       Expected  Outcomes Short Term: Able to explain why pulse checking is important during independent exercise;Long Term: Able to check pulse independently and accurately       Understanding of Exercise Prescription Yes       Intervention Provide education, explanation, and written materials on patient's individual exercise prescription       Expected Outcomes Short Term: Able to explain program exercise prescription;Long Term: Able to explain home exercise prescription to exercise independently                Exercise Goals Re-Evaluation:  Exercise Goals Re-Evaluation     Row Name 06/06/22 1130 06/13/22 1115 06/20/22 1043 06/29/22 1049 07/11/22 1050     Exercise Goal Re-Evaluation   Exercise Goals Review Increase Physical Activity;Able to understand and use rate of perceived exertion (RPE) scale;Increase Strength and Stamina Increase Physical Activity;Able to understand and use rate of perceived exertion (RPE) scale;Increase Strength and Stamina Increase Physical Activity;Able to understand and use rate of perceived exertion (RPE) scale;Increase Strength and Stamina;Understanding of Exercise Prescription;Knowledge and understanding of Target Heart Rate Range (THRR) Increase Physical Activity;Able to understand and use rate of perceived exertion (RPE) scale;Increase Strength and Stamina;Understanding of Exercise Prescription;Knowledge and understanding of Target Heart Rate Range (THRR) Increase Physical Activity;Able to understand and use rate of perceived exertion (RPE) scale;Increase Strength and Stamina;Understanding of Exercise Prescription;Knowledge and understanding of Target Heart Rate Range (THRR)   Comments Off to a good start with exercise. Patient is making good progress with exercise at cardiac rehab. Patient is walking 12-19 minutes daily, riding stationary bike 2-3 miles, 21-28 minutes at fitness center, and weight lifting at home and at the gym. Patient's goal is to increase strength. Patient is  currently walking and/or riding stationary bike at local fitness daily as his mode of home exercise. Patient is also lifting weights at home home and at the gym. Patient's goal is to return to work without issues. Patient works in [employer]. Patient is still restricted to 20 lbs for lifting but has been given the OK to mow and do light chores. Reviewed how to manually check pulse. Patient continues to make excellent progress with exericse. Will increase workload on the recumbent bike from 3 to 4 and increase handweights from 6lbs to 7lbs to help increase strength and stamina. Patient feels like he's somewhat back to being able to do his work activities. Patient is making steady progress with exercise. Patient is tired today after walking 34 minutes this weekend. Patient is walking at least 20 minutes dailly and tolerating well. Patient has been cleared to return to work.   Expected Outcomes Progress workloads as tolerated to help increase cardiorespiratory fitness. Patient will continue current daily exercise routine to help increase strength. Continue daily exercise routine. Progress workloads as tolerated to help increase strength and stamina, so that patient may  return to work activities once cleared by physician to do so. Continue to progress workloads to increase strength and stamina. Continue daily exercise routine.    [place] Name 07/26/22 0738             Exercise Goal Re-Evaluation   Exercise Goals Review Increase Physical Activity;Able to understand and use rate of perceived exertion (RPE) scale;Increase Strength and Stamina;Understanding of Exercise Prescription;Knowledge and understanding of Target Heart Rate Range (THRR)       Comments Patient decided to complete the program ealry due to change in insurance coverage. Will continue exercise independently.       Expected Outcomes Patient will continue daily exercise routine to help maintain health and fitness gains.                 Nutrition & Weight - Outcomes:  Pre Biometrics - 05/31/22 0943       Pre Biometrics   Waist Circumference 39.25 inches    Hip Circumference 40.75 inches    Waist to Hip Ratio 0.96 %    Triceps Skinfold 17 mm    % Body Fat 27.1 %    Grip Strength 52 kg    Flexibility 0 in   Knees bent   Single Leg Stand 30 seconds              Nutrition:  Nutrition Therapy & Goals - 07/06/22 1640       Nutrition Therapy   Diet Heart Healthy diet    Drug/Food Interactions Statins/Certain Fruits      Personal Nutrition Goals   Nutrition Goal Patient to choose a daily variety of fruits, vegetables, whole grains, nonfat dairy, lean protein/plant protein as part of heart healthy lifestyle    Personal Goal #2 Patient to limit to <1529m of sodium per day.    Personal Goal #3 Patient to identify and limit food souces of saturated fat, trans fat, sodium, and refined carbohydrates.    Comments Goals in progress. Patient continue Pritikin education classes and continues to monitor sodium intake, reduce saturated fat, and increase high fiber foods.      Intervention Plan   Intervention Prescribe, educate and counsel regarding individualized specific dietary modifications aiming towards targeted core components such as weight, hypertension, lipid management, diabetes, heart failure and other comorbidities.    Expected Outcomes Short Term Goal: Understand basic principles of dietary content, such as calories, fat, sodium, cholesterol and nutrients.;Long Term Goal: Adherence to prescribed nutrition plan.             Nutrition Discharge:  Nutrition Assessments - 07/26/22 0931       Rate Your Plate Scores   Post Score 53             Education Questionnaire Score:  Knowledge Questionnaire Score - 07/18/22 1637       Knowledge Questionnaire Score   Pre Score 21/28    Post Score 25/28             Goals reviewed with patient; copy given to patient.Pt graduates from   Intensive/Traditional cardiac rehab program today with completion of    ** exercise and education sessions. Pt maintained good attendance and progressed nicely during their participation in rehab as evidenced by increased MET level.   Medication list reconciled. Repeat  PHQ score-  .  Pt has made significant lifestyle changes and should be commended for their success.  **** achieved their goals during cardiac rehab.   Pt plans to continue exercise at *****.

## 2022-07-27 ENCOUNTER — Encounter (HOSPITAL_COMMUNITY): Payer: 59

## 2022-07-29 ENCOUNTER — Encounter (HOSPITAL_COMMUNITY): Payer: 59

## 2022-09-29 ENCOUNTER — Other Ambulatory Visit: Payer: Self-pay

## 2022-09-29 ENCOUNTER — Encounter: Payer: Self-pay | Admitting: Emergency Medicine

## 2022-09-29 ENCOUNTER — Ambulatory Visit
Admission: EM | Admit: 2022-09-29 | Discharge: 2022-09-29 | Disposition: A | Discharge status: Home / Self Care | Payer: Commercial Managed Care - HMO | Attending: Internal Medicine | Admitting: Internal Medicine

## 2022-09-29 DIAGNOSIS — R300 Dysuria: Secondary | ICD-10-CM | POA: Diagnosis not present

## 2022-09-29 DIAGNOSIS — M545 Low back pain, unspecified: Secondary | ICD-10-CM | POA: Diagnosis not present

## 2022-09-29 DIAGNOSIS — R350 Frequency of micturition: Secondary | ICD-10-CM | POA: Diagnosis not present

## 2022-09-29 LAB — POCT URINALYSIS DIP (MANUAL ENTRY)
Bilirubin, UA: NEGATIVE
Blood, UA: NEGATIVE
Glucose, UA: NEGATIVE mg/dL
Ketones, POC UA: NEGATIVE mg/dL
Leukocytes, UA: NEGATIVE
Nitrite, UA: NEGATIVE
Protein Ur, POC: NEGATIVE mg/dL
Spec Grav, UA: >=1.03 — AB (ref 1.010–1.025)
Urobilinogen, UA: 0.2 E.U./dL
pH, UA: 5.5 (ref 5.0–8.0)

## 2022-09-29 NOTE — Discharge Instructions (Signed)
Your urine did not show any signs of infection.  Your blood work is pending.  We will call if it is abnormal.  Please call [facility] today to schedule an appointment as soon as possible for further evaluation and management.

## 2022-09-29 NOTE — ED Provider Notes (Signed)
[name]    CSN: [id number] Arrival date & time: 09/29/22  0831      History   Chief Complaint Chief Complaint  Patient presents with   Dysuria    HPI John Delacruz is a 61 y.o. male.   Patient presents with urinary burning, urinary frequency, back pain that started about a week ago.  Patient denies hematuria, penile discharge, testicular pain, urinary hesitancy, fever.  Patient reports that he has had a urinary tract infection in the past and this "feels similar".  He denies any prior history of prostate issues or any feelings of swelling or feeling like he is "sitting on a ball".  Patient denies any concern or exposure to STD as well.  Back pain is present in the lower to left-sided back.  Denies any injury to this area.  Patient does not report taking medications for symptoms.  Pain does not radiate.  Denies any numbness or tingling.  Denies urinary or bowel incontinence or saddle anesthesia.   Dysuria   Past Medical History:  Diagnosis Date   Anginal pain (HCC) 02/2022   Coronary artery disease    Hypertension     Patient Active Problem List   Diagnosis Date Noted   S/P CABG x 4 03/28/2022   CAD in native artery 03/17/2022   Angina pectoris (HCC)    Nonspecific abnormal electrocardiogram (ECG) (EKG) 02/28/2022   Overweight (BMI 25.0-29.9) 02/28/2022   Precordial pain 02/28/2022   Diverticulitis 12/18/2013   HTN (hypertension) 01/01/2013    Past Surgical History:  Procedure Laterality Date   CARDIAC CATHETERIZATION     CORONARY ARTERY BYPASS GRAFT N/A 03/28/2022   Procedure: CORONARY ARTERY BYPASS GRAFTING X4, USING LEFT INTERNAL MAMMARY ARTERY, LEFT AND RIGHT GREATER SAPHENOUS VEIN HARVESTED ENDOSCOPICALLY;  Surgeon: Alleen Borne, MD;  Location: [facility] OR;  Service: Open Heart Surgery;  Laterality: N/A;   FRACTURE SURGERY     HERNIA REPAIR     LEFT HEART CATH AND CORONARY ANGIOGRAPHY N/A 03/17/2022   Procedure: LEFT HEART CATH AND CORONARY  ANGIOGRAPHY;  Surgeon: Lyn Records, MD;  Location: [facility] INVASIVE CV LAB;  Service: Cardiovascular;  Laterality: N/A;   TEE WITHOUT CARDIOVERSION N/A 03/28/2022   Procedure: TRANSESOPHAGEAL ECHOCARDIOGRAM (TEE);  Surgeon: Alleen Borne, MD;  Location: [facility] OR;  Service: Open Heart Surgery;  Laterality: N/A;       Home Medications    Prior to Admission medications   Medication Sig Start Date End Date Taking? Authorizing Provider  acetaminophen (TYLENOL) 500 MG tablet Take 1-2 tablets (500-1,000 mg total) by mouth every 6 (six) hours as needed. 04/01/22   Barrett, Erin R, PA-C  amLODipine (NORVASC) 10 MG tablet Take 10 mg by mouth daily.    [provider]  aspirin EC 81 MG tablet Take 1 tablet (81 mg total) by mouth daily. Swallow whole. 03/17/22 03/17/23  Lyn Records, MD  atorvastatin (LIPITOR) 20 MG tablet Take 1 tablet (20 mg total) by mouth daily. 06/29/22   Tobb, Kardie, DO  bisacodyl (DULCOLAX) 5 MG EC tablet Take 5 mg by mouth daily as needed for moderate constipation.    [provider]  cyclobenzaprine (FLEXERIL) 10 MG tablet Take 1 tablet (10 mg total) by mouth 2 (two) times daily as needed for muscle spasms. 01/21/22   Tomi Bamberger, PA-C  hydrochlorothiazide (MICROZIDE) 12.5 MG capsule Take 1 capsule (12.5 mg total) by mouth daily. 06/13/22   Tobb, Kardie, DO  metoprolol succinate (TOPROL XL) 25  MG 24 hr tablet Take 0.5 tablets (12.5 mg total) by mouth daily. 06/13/22   Tobb, Kardie, DO  oxymetazoline (AFRIN) 0.05 % nasal spray Place 1 spray into both nostrils 2 (two) times daily as needed for congestion.    [provider]  traMADol (ULTRAM) 50 MG tablet Take 1-2 tablets (50-100 mg total) by mouth every 4 (four) hours as needed for moderate pain. Patient not taking: Reported on 05/25/2022 04/01/22   Barrett, Rae Roam, PA-C  traZODone (DESYREL) 100 MG tablet Take 50-100 mg by mouth at bedtime as needed for sleep. Patient not taking: Reported on 06/13/2022 04/15/22    [provider]  valsartan (DIOVAN) 40 MG tablet Take 1 tablet (40 mg total) by mouth daily. 06/13/22   Tobb, Kardie, DO  VITAMIN D PO Take 1 capsule by mouth daily.    [provider]    Family History Family History  Problem Relation Age of Onset   Hypertension Mother    Kidney disease Mother    Lupus Sister     Social History Social History   Tobacco Use   Smoking status: Former    Packs/day: 1.00    Years: 35.00    Total pack years: 35.00    Types: Cigarettes    Quit date: 03/27/2022    Years since quitting: 0.5   Smokeless tobacco: Never   Tobacco comments:    Quit on 03/27/22 and has not resumed smoking  Vaping Use   Vaping Use: Never used  Substance Use Topics   Alcohol use: Not Currently    Alcohol/week: 3.0 - 4.0 standard drinks of alcohol    Types: 3 - 4 Cans of beer per week    Comment: not drinking currently   Drug use: No     Allergies   Lisinopril   Review of Systems Review of Systems Per HPI  Physical Exam Triage Vital Signs ED Triage Vitals  Enc Vitals Group     BP 09/29/22 0900 129/78     Pulse Rate 09/29/22 0900 67     Resp 09/29/22 0900 18     Temp 09/29/22 0900 97.9 F (36.6 C)     Temp Source 09/29/22 0900 Oral     SpO2 09/29/22 0900 98 %     Weight --      Height --      Head Circumference --      Peak Flow --      Pain Score 09/29/22 0901 4     Pain Loc --      Pain Edu? --      Excl. in GC? --    No data found.  Updated Vital Signs BP 129/78 (BP Location: Left Arm)   Pulse 67   Temp 97.9 F (36.6 C) (Oral)   Resp 18   SpO2 98%   Visual Acuity Right Eye Distance:   Left Eye Distance:   Bilateral Distance:    Right Eye Near:   Left Eye Near:    Bilateral Near:     Physical Exam Constitutional:      General: He is not in acute distress.    Appearance: Normal appearance. He is not toxic-appearing or diaphoretic.  HENT:     Head: Normocephalic and atraumatic.  Eyes:     Extraocular Movements:  Extraocular movements intact.     Conjunctiva/sclera: Conjunctivae normal.  Cardiovascular:     Rate and Rhythm: Normal rate and regular rhythm.     Pulses: Normal pulses.  Heart sounds: Normal heart sounds.  Pulmonary:     Effort: Pulmonary effort is normal. No respiratory distress.     Breath sounds: Normal breath sounds.  Musculoskeletal:     Comments: No tenderness to palpation to any part of the back. No crepitus or step-off noted.  No obvious swelling or discoloration noted.  Neurological:     General: No focal deficit present.     Mental Status: He is alert and oriented to person, place, and time. Mental status is at baseline.  Psychiatric:        Mood and Affect: Mood normal.        Behavior: Behavior normal.        Thought Content: Thought content normal.        Judgment: Judgment normal.      UC Treatments / Results  Labs (all labs ordered are listed, but only abnormal results are displayed) Labs Reviewed  POCT URINALYSIS DIP (MANUAL ENTRY) - Abnormal; Notable for the following components:      Result Value   Spec Grav, UA >=1.030 (*)    All other components within normal limits  CBC  COMPREHENSIVE METABOLIC PANEL    EKG   Radiology No results found.  Procedures Procedures (including critical care time)  Medications Ordered in UC Medications - No data to display  Initial Impression / Assessment and Plan / UC Course  I have reviewed the triage vital signs and the nursing notes.  Pertinent labs & imaging results that were available during my care of the patient were reviewed by me and considered in my medical decision making (see chart for details).     Urinalysis unremarkable for any type of infection.  Will defer urine culture given this.  Patient does not have any concern for STDs so STD testing was also deferred.  There is low concern for kidney stone given no blood noted microscopically on UA and no gross hematuria.  Low concern for prostate  issues as well given patient's description of symptoms and no prior history of prostate problems.  Therefore, will obtain CMP and CBC and patient was advised to follow-up with urology for further evaluation and management as soon as possible.  Patient reports that he has an established relationship with [facility] already.  Patient was also given strict return and ER precautions.  Patient verbalized understanding and was agreeable with plan. Final Clinical Impressions(s) / UC Diagnoses   Final diagnoses:  Dysuria  Urinary frequency  Acute low back pain without sciatica, unspecified back pain laterality     Discharge Instructions      Your urine did not show any signs of infection.  Your blood work is pending.  We will call if it is abnormal.  Please call [facility] today to schedule an appointment as soon as possible for further evaluation and management.    ED Prescriptions   None    PDMP not reviewed this encounter.   Gustavus Bryant, [place] 09/29/22 1039

## 2022-09-29 NOTE — ED Triage Notes (Signed)
Pt here for dysuria and some frequency; pt sts back and side pain

## 2022-09-30 LAB — COMPREHENSIVE METABOLIC PANEL
ALT: 17 IU/L (ref 0–44)
AST: 15 IU/L (ref 0–40)
Albumin/Globulin Ratio: 1.8 (ref 1.2–2.2)
Albumin: 4.2 g/dL (ref 3.9–4.9)
Alkaline Phosphatase: 81 IU/L (ref 44–121)
BUN/Creatinine Ratio: 13 (ref 10–24)
BUN: 20 mg/dL (ref 8–27)
Bilirubin Total: 0.3 mg/dL (ref 0.0–1.2)
CO2: 24 mmol/L (ref 20–29)
Calcium: 9.9 mg/dL (ref 8.6–10.2)
Chloride: 101 mmol/L (ref 96–106)
Creatinine, Ser: 1.6 mg/dL — ABNORMAL HIGH (ref 0.76–1.27)
Globulin, Total: 2.3 g/dL (ref 1.5–4.5)
Glucose: 108 mg/dL — ABNORMAL HIGH (ref 70–99)
Potassium: 4.5 mmol/L (ref 3.5–5.2)
Sodium: 138 mmol/L (ref 134–144)
Total Protein: 6.5 g/dL (ref 6.0–8.5)
eGFR: 48 mL/min/{1.73_m2} — ABNORMAL LOW (ref >59)

## 2022-09-30 LAB — CBC
Hematocrit: 38.6 % (ref 37.5–51.0)
Hemoglobin: 12.4 g/dL — ABNORMAL LOW (ref 13.0–17.7)
MCH: 25.7 pg — ABNORMAL LOW (ref 26.6–33.0)
MCHC: 32.1 g/dL (ref 31.5–35.7)
MCV: 80 fL (ref 79–97)
Platelets: 291 10*3/uL (ref 150–450)
RBC: 4.83 x10E6/uL (ref 4.14–5.80)
RDW: 14.3 % (ref 11.6–15.4)
WBC: 7.1 10*3/uL (ref 3.4–10.8)

## 2022-11-02 ENCOUNTER — Encounter: Payer: Self-pay | Admitting: Cardiology

## 2022-11-02 ENCOUNTER — Other Ambulatory Visit: Payer: Self-pay | Admitting: Physician Assistant

## 2022-11-02 ENCOUNTER — Telehealth: Payer: Self-pay | Admitting: Physician Assistant

## 2022-11-02 MED ORDER — METHYLPREDNISOLONE 4 MG PO TBPK: ORAL_TABLET | ORAL | 0 refills | Status: DC

## 2022-11-02 NOTE — Telephone Encounter (Signed)
Pt called in stating he would like to speak with PA [facility] about his last visit he had with her.... Pt stated that its concerning his back... Pt wouldn't give anymore information... Last visit pt had with PA Maryanne was 04/19/22... Pt requesting callback.Marland KitchenMarland KitchenMarland Kitchen

## 2022-11-04 ENCOUNTER — Telehealth: Payer: Self-pay | Admitting: Physician Assistant

## 2022-11-04 ENCOUNTER — Other Ambulatory Visit: Payer: Self-pay | Admitting: Physician Assistant

## 2022-11-04 NOTE — Telephone Encounter (Signed)
Pt called in requesting for PA MaryAnne to give him a call so he can discuss the medication that she prescribe to him.... Pt requesting callback.Marland KitchenMarland KitchenMarland Kitchen

## 2022-12-01 ENCOUNTER — Encounter: Payer: Self-pay | Admitting: Cardiology

## 2022-12-01 ENCOUNTER — Ambulatory Visit: Payer: Commercial Managed Care - HMO | Attending: Cardiology | Admitting: Cardiology

## 2022-12-01 VITALS — BP 120/64 | HR 68 | Ht 69.5 in | Wt 209.4 lb

## 2022-12-01 DIAGNOSIS — I2510 Atherosclerotic heart disease of native coronary artery without angina pectoris: Secondary | ICD-10-CM

## 2022-12-01 DIAGNOSIS — Z79899 Other long term (current) drug therapy: Secondary | ICD-10-CM | POA: Diagnosis not present

## 2022-12-01 DIAGNOSIS — I10 Essential (primary) hypertension: Secondary | ICD-10-CM | POA: Diagnosis not present

## 2022-12-01 LAB — BASIC METABOLIC PANEL
BUN/Creatinine Ratio: 12 (ref 10–24)
BUN: 19 mg/dL (ref 8–27)
CO2: 25 mmol/L (ref 20–29)
Calcium: 10 mg/dL (ref 8.6–10.2)
Chloride: 103 mmol/L (ref 96–106)
Creatinine, Ser: 1.59 mg/dL — ABNORMAL HIGH (ref 0.76–1.27)
Glucose: 130 mg/dL — ABNORMAL HIGH (ref 70–99)
Potassium: 4.3 mmol/L (ref 3.5–5.2)
Sodium: 140 mmol/L (ref 134–144)
eGFR: 48 mL/min/{1.73_m2} — ABNORMAL LOW (ref >59)

## 2022-12-01 LAB — MAGNESIUM: Magnesium: 1.9 mg/dL (ref 1.6–2.3)

## 2022-12-01 MED ORDER — AMLODIPINE BESYLATE 5 MG PO TABS
5.0000 mg | ORAL_TABLET | Freq: Every day | ORAL | 3 refills | Status: DC
Start: 2022-12-01 — End: 2023-03-31

## 2022-12-01 NOTE — Progress Notes (Signed)
Cardiology Office Note:    Date:  12/01/2022   ID:  Dina Rich, DOB October 15, 1961, MRN [id number]  PCP:  Mila Palmer, MD  Cardiologist:  Thomasene Ripple, DO  Electrophysiologist:  None   Referring MD: Mila Palmer, MD   " I am doing fine"   History of Present Illness:    John Delacruz is a 62 y.o. male with a hx of coronary artery disease status post CABG x4 with LIMA to LAD, SVG to OM1, SVG to OM 2, SVG to [facility] on Mar 28, 2022, his postop surgical stay was uncomplicated, hypertension, tobacco use, here today for follow-up visit.  At his visit in July 2023 he was with his wife.  He had been status post CABG.  He was doing well.  Back on his Diovan at that time as well as his metoprolol due to the low blood pressure.  I kept him on his hydrochlorothiazide and his amlodipine.  Reports today he is experiencing leg swelling.  No other complaints at this time.  Past Medical History:  Diagnosis Date   Anginal pain (HCC) 02/2022   Coronary artery disease    Hypertension     Past Surgical History:  Procedure Laterality Date   CARDIAC CATHETERIZATION     CORONARY ARTERY BYPASS GRAFT N/A 03/28/2022   Procedure: CORONARY ARTERY BYPASS GRAFTING X4, USING LEFT INTERNAL MAMMARY ARTERY, LEFT AND RIGHT GREATER SAPHENOUS VEIN HARVESTED ENDOSCOPICALLY;  Surgeon: Alleen Borne, MD;  Location: [facility] OR;  Service: Open Heart Surgery;  Laterality: N/A;   FRACTURE SURGERY     HERNIA REPAIR     LEFT HEART CATH AND CORONARY ANGIOGRAPHY N/A 03/17/2022   Procedure: LEFT HEART CATH AND CORONARY ANGIOGRAPHY;  Surgeon: Lyn Records, MD;  Location: [facility] INVASIVE CV LAB;  Service: Cardiovascular;  Laterality: N/A;   TEE WITHOUT CARDIOVERSION N/A 03/28/2022   Procedure: TRANSESOPHAGEAL ECHOCARDIOGRAM (TEE);  Surgeon: Alleen Borne, MD;  Location: [facility] OR;  Service: Open Heart Surgery;  Laterality: N/A;    Current Medications: Current Meds  Medication Sig   acetaminophen (TYLENOL) 500 MG tablet Take 1-2  tablets (500-1,000 mg total) by mouth every 6 (six) hours as needed.   amLODipine (NORVASC) 5 MG tablet Take 1 tablet (5 mg total) by mouth daily.   aspirin EC 81 MG tablet Take 1 tablet (81 mg total) by mouth daily. Swallow whole.   atorvastatin (LIPITOR) 20 MG tablet Take 1 tablet (20 mg total) by mouth daily.   bisacodyl (DULCOLAX) 5 MG EC tablet Take 5 mg by mouth daily as needed for moderate constipation.   methylPREDNISolone (MEDROL DOSEPAK) 4 MG TBPK tablet Take as directed with food   metoprolol succinate (TOPROL XL) 25 MG 24 hr tablet Take 0.5 tablets (12.5 mg total) by mouth daily.   oxymetazoline (AFRIN) 0.05 % nasal spray Place 1 spray into both nostrils 2 (two) times daily as needed for congestion.   valsartan (DIOVAN) 40 MG tablet Take 1 tablet (40 mg total) by mouth daily.   VITAMIN D PO Take 1 capsule by mouth daily.   [DISCONTINUED] amLODipine (NORVASC) 10 MG tablet Take 10 mg by mouth daily.   Current Facility-Administered Medications for the 12/01/22 encounter (Office Visit) with Thomasene Ripple, DO  Medication   nitroGLYCERIN (NITROSTAT) SL tablet 0.4 mg     Allergies:   Lisinopril   Social History   Socioeconomic History   Marital status: Married    Spouse name: Not on file   Number of children: Not  on file   Years of education: 16   Highest education level: Bachelor's degree (e.g., BA, AB, BS)  Occupational History   Not on file  Tobacco Use   Smoking status: Former    Packs/day: 1.00    Years: 35.00    Total pack years: 35.00    Types: Cigarettes    Quit date: 03/27/2022    Years since quitting: 0.6   Smokeless tobacco: Never   Tobacco comments:    Quit on 03/27/22 and has not resumed smoking  Vaping Use   Vaping Use: Never used  Substance and Sexual Activity   Alcohol use: Not Currently    Alcohol/week: 3.0 - 4.0 standard drinks of alcohol    Types: 3 - 4 Cans of beer per week    Comment: not drinking currently   Drug use: No   Sexual activity: Yes   Other Topics Concern   Not on file  Social History Narrative   Not on file   Social Determinants of Health   Financial Resource Strain: Not on file  Food Insecurity: Not on file  Transportation Needs: Not on file  Physical Activity: Not on file  Stress: Not on file  Social Connections: Not on file     Family History: The patient's family history includes Hypertension in his mother; Kidney disease in his mother; Lupus in his sister.  ROS:   Review of Systems  Constitution: Negative for decreased appetite, fever and weight gain.  HENT: Negative for congestion, ear discharge, hoarse voice and sore throat.   Eyes: Negative for discharge, redness, vision loss in right eye and visual halos.  Cardiovascular: Negative for chest pain, dyspnea on exertion, leg swelling, orthopnea and palpitations.  Respiratory: Negative for cough, hemoptysis, shortness of breath and snoring.   Endocrine: Negative for heat intolerance and polyphagia.  Hematologic/Lymphatic: Negative for bleeding problem. Does not bruise/bleed easily.  Skin: Negative for flushing, nail changes, rash and suspicious lesions.  Musculoskeletal: Negative for arthritis, joint pain, muscle cramps, myalgias, neck pain and stiffness.  Gastrointestinal: Negative for abdominal pain, bowel incontinence, diarrhea and excessive appetite.  Genitourinary: Negative for decreased libido, genital sores and incomplete emptying.  Neurological: Negative for brief paralysis, focal weakness, headaches and loss of balance.  Psychiatric/Behavioral: Negative for altered mental status, depression and suicidal ideas.  Allergic/Immunologic: Negative for HIV exposure and persistent infections.    EKGs/Labs/Other Studies Reviewed:    The following studies were reviewed today:   EKG:  None today  Recent Labs: 03/29/2022: Magnesium 2.4 09/29/2022: ALT 17; BUN 20; Creatinine, Ser 1.60; Hemoglobin 12.4; Platelets 291; Potassium 4.5; Sodium 138  Recent  Lipid Panel    Component Value Date/Time   CHOL 139 05/26/2022 0924   TRIG 110 05/26/2022 0924   HDL 42 05/26/2022 0924   CHOLHDL 3.3 05/26/2022 0924   LDLCALC 77 05/26/2022 0924    Physical Exam:    VS:  BP 120/64   Pulse 68   Ht 5' 9.5" (1.765 m)   Wt 95 kg   SpO2 100%   BMI 30.48 kg/m     Wt Readings from Last 3 Encounters:  12/01/22 95 kg  07/18/22 86.8 kg  06/13/22 84.5 kg     GEN: Well nourished, well developed in no acute distress HEENT: Normal NECK: No JVD; No carotid bruits LYMPHATICS: No lymphadenopathy CARDIAC: S1S2 noted,RRR, no murmurs, rubs, gallops RESPIRATORY:  Clear to auscultation without rales, wheezing or rhonchi  ABDOMEN: Soft, non-tender, non-distended, +bowel sounds, no guarding. EXTREMITIES: No  edema, No cyanosis, no clubbing MUSCULOSKELETAL:  No deformity  SKIN: Warm and dry NEUROLOGIC:  Alert and oriented x 3, non-focal PSYCHIATRIC:  Normal affect, good insight  ASSESSMENT:    1. Hypertension, unspecified type   2. CAD in native artery   3. Medication management     PLAN:    We discussed his medications today.  He does have a leg swelling which can be caused by high-dose amlodipine.  Will cut back to 5 mg on his Amlodipine.  Continue hydrochlorothiazide 12.5 mg daily.  No angina symptoms.   Hyperlipidemia-continue his current statin dose.   He quit smoking since his event.  The patient is in agreement with the above plan. The patient left the office in stable condition.  The patient will follow up in 6 months or sooner if needed.   Medication Adjustments/Labs and Tests Ordered: Current medicines are reviewed at length with the patient today.  Concerns regarding medicines are outlined above.  Orders Placed This Encounter  Procedures   Basic Metabolic Panel (BMET)   Magnesium   EKG 12-Lead   Meds ordered this encounter  Medications   amLODipine (NORVASC) 5 MG tablet    Sig: Take 1 tablet (5 mg total) by mouth daily.     Dispense:  90 tablet    Refill:  3    Patient Instructions  Medication Instructions:  Your physician has recommended you make the following change in your medication:  DECREASE: Amlodipine 5 mg  START: Hydrochlorothiazide 12.5 mg once daily  *If you need a refill on your cardiac medications before your next appointment, please call your pharmacy*   Lab Work: TODAY: BMET, Mag If you have labs (blood work) drawn today and your tests are completely normal, you will receive your results only by: [place] (if you have MyChart) OR A paper copy in the mail If you have any lab test that is abnormal or we need to change your treatment, we will call you to review the results.   Testing/Procedures: None   Follow-Up: At [facility], you and your health needs are our priority.  As part of our continuing mission to provide you with exceptional heart care, we have created designated Provider Care Teams.  These Care Teams include your primary Cardiologist (physician) and Advanced Practice Providers (APPs -  Physician Assistants and Nurse Practitioners) who all work together to provide you with the care you need, when you need it.  We recommend signing up for the patient portal called "MyChart".  Sign up information is provided on this After Visit Summary.  MyChart is used to connect with patients for Virtual Visits (Telemedicine).  Patients are able to view lab/test results, encounter notes, upcoming appointments, etc.  Non-urgent messages can be sent to your provider as well.   To learn more about what you can do with MyChart, go to [email or web address].    Your next appointment:   6 month(s)  Provider:   Berniece Salines, DO     Other Instructions     Adopting a Healthy Lifestyle.  Know what a healthy weight is for you (roughly BMI <25) and aim to maintain this   Aim for 7+ servings of fruits and vegetables daily   65-80+ fluid ounces of water or unsweet tea for healthy  kidneys   Limit to max 1 drink of alcohol per day; avoid smoking/tobacco   Limit animal fats in diet for cholesterol and heart health - choose grass fed whenever available  Avoid highly processed foods, and foods high in saturated/trans fats   Aim for low stress - take time to unwind and care for your mental health   Aim for 150 min of moderate intensity exercise weekly for heart health, and weights twice weekly for bone health   Aim for 7-9 hours of sleep daily   When it comes to diets, agreement about the perfect plan isnt easy to find, even among the experts. Experts at the [employer] developed an idea known as the Healthy Eating Plate. Just imagine a plate divided into logical, healthy portions.   The emphasis is on diet quality:   Load up on vegetables and fruits - one-half of your plate: Aim for color and variety, and remember that potatoes dont count.   Go for whole grains - one-quarter of your plate: Whole wheat, barley, wheat berries, quinoa, oats, brown rice, and foods made with them. If you want pasta, go with whole wheat pasta.   Protein power - one-quarter of your plate: Fish, chicken, beans, and nuts are all healthy, versatile protein sources. Limit red meat.   The diet, however, does go beyond the plate, offering a few other suggestions.   Use healthy plant oils, such as olive, canola, soy, corn, sunflower and peanut. Check the labels, and avoid partially hydrogenated oil, which have unhealthy trans fats.   If youre thirsty, drink water. Coffee and tea are good in moderation, but skip sugary drinks and limit milk and dairy products to one or two daily servings.   The type of carbohydrate in the diet is more important than the amount. Some sources of carbohydrates, such as vegetables, fruits, whole grains, and beans-are healthier than others.   Finally, stay active  Signed, Berniece Salines, DO  12/01/2022 10:44 AM    [place]

## 2022-12-01 NOTE — Patient Instructions (Addendum)
Medication Instructions:  Your physician has recommended you make the following change in your medication:  DECREASE: Amlodipine 5 mg  START: Hydrochlorothiazide 12.5 mg once daily  *If you need a refill on your cardiac medications before your next appointment, please call your pharmacy*   Lab Work: TODAY: BMET, Mag If you have labs (blood work) drawn today and your tests are completely normal, you will receive your results only by: [place] (if you have MyChart) OR A paper copy in the mail If you have any lab test that is abnormal or we need to change your treatment, we will call you to review the results.   Testing/Procedures: None   Follow-Up: At [facility], you and your health needs are our priority.  As part of our continuing mission to provide you with exceptional heart care, we have created designated Provider Care Teams.  These Care Teams include your primary Cardiologist (physician) and Advanced Practice Providers (APPs -  Physician Assistants and Nurse Practitioners) who all work together to provide you with the care you need, when you need it.  We recommend signing up for the patient portal called "MyChart".  Sign up information is provided on this After Visit Summary.  MyChart is used to connect with patients for Virtual Visits (Telemedicine).  Patients are able to view lab/test results, encounter notes, upcoming appointments, etc.  Non-urgent messages can be sent to your provider as well.   To learn more about what you can do with MyChart, go to [email or web address].    Your next appointment:   6 month(s)  Provider:   Berniece Salines, DO     Other Instructions

## 2022-12-05 ENCOUNTER — Encounter: Payer: Self-pay | Admitting: Cardiology

## 2022-12-26 DIAGNOSIS — N1831 Chronic kidney disease, stage 3a: Secondary | ICD-10-CM | POA: Diagnosis not present

## 2022-12-26 DIAGNOSIS — E785 Hyperlipidemia, unspecified: Secondary | ICD-10-CM | POA: Diagnosis not present

## 2022-12-26 DIAGNOSIS — I10 Essential (primary) hypertension: Secondary | ICD-10-CM | POA: Diagnosis not present

## 2022-12-26 DIAGNOSIS — R7301 Impaired fasting glucose: Secondary | ICD-10-CM | POA: Diagnosis not present

## 2022-12-26 DIAGNOSIS — I25118 Atherosclerotic heart disease of native coronary artery with other forms of angina pectoris: Secondary | ICD-10-CM | POA: Diagnosis not present

## 2023-03-02 ENCOUNTER — Encounter: Payer: Self-pay | Admitting: Cardiology

## 2023-03-02 NOTE — Telephone Encounter (Signed)
Called pt. He has an appt 03/31/23. No need to do anything else at this time.

## 2023-03-31 ENCOUNTER — Encounter: Payer: Self-pay | Admitting: Cardiology

## 2023-03-31 ENCOUNTER — Ambulatory Visit: Payer: 59 | Attending: Cardiology | Admitting: Cardiology

## 2023-03-31 VITALS — BP 145/80 | HR 60 | Ht 69.0 in | Wt 204.0 lb

## 2023-03-31 DIAGNOSIS — Z951 Presence of aortocoronary bypass graft: Secondary | ICD-10-CM

## 2023-03-31 DIAGNOSIS — E785 Hyperlipidemia, unspecified: Secondary | ICD-10-CM | POA: Diagnosis not present

## 2023-03-31 DIAGNOSIS — I10 Essential (primary) hypertension: Secondary | ICD-10-CM | POA: Diagnosis not present

## 2023-03-31 DIAGNOSIS — I2510 Atherosclerotic heart disease of native coronary artery without angina pectoris: Secondary | ICD-10-CM | POA: Diagnosis not present

## 2023-03-31 DIAGNOSIS — E663 Overweight: Secondary | ICD-10-CM | POA: Diagnosis not present

## 2023-03-31 MED ORDER — CARVEDILOL 12.5 MG PO TABS: 12.5000 mg | ORAL_TABLET | Freq: Two times a day (BID) | ORAL | 3 refills | Status: DC

## 2023-03-31 NOTE — Patient Instructions (Addendum)
Medication Instructions:  Your physician has recommended you make the following change in your medication:  STOP: Valsartan  STOP: Metoprolol  START:  Carvedilol (Coreg) 12.5 mg twice daily *If you need a refill on your cardiac medications before your next appointment, please call your pharmacy*   Lab Work: None   Testing/Procedures: None   Follow-Up: At [facility], you and your health needs are our priority.  As part of our continuing mission to provide you with exceptional heart care, we have created designated Provider Care Teams.  These Care Teams include your primary Cardiologist (physician) and Advanced Practice Providers (APPs -  Physician Assistants and Nurse Practitioners) who all work together to provide you with the care you need, when you need it.   Your next appointment:   6 month(s)  Provider:   Thomasene Ripple, DO

## 2023-03-31 NOTE — Progress Notes (Signed)
Cardiology Office Note:    Date:  03/31/2023   ID:  John Delacruz, DOB 1961/09/20, MRN [id number]  PCP:  Mila Palmer, MD  Cardiologist:  Thomasene Ripple, DO  Electrophysiologist:  None   Referring MD: Mila Palmer, MD   " I am doing fine"   History of Present Illness:    John Delacruz is a 62 y.o. male with a hx of coronary artery disease status post CABG x4 with LIMA to LAD, SVG to OM1, SVG to OM 2, SVG to [facility] on Mar 28, 2022, his postop surgical stay was uncomplicated, hypertension, tobacco use, here today for follow-up visit.  At his visit in July 2023 he was with his wife.  He had been status post CABG.  He was doing well.  Back on his Diovan at that time as well as his metoprolol due to the low blood pressure.  I kept him on his hydrochlorothiazide and his amlodipine. Since his last visit he has been doing well.  He is back to work.  He tells me that he is experiencing some back pain with the valsartan.  Past Medical History:  Diagnosis Date   Anginal pain (HCC) 02/2022   Coronary artery disease    Hypertension     Past Surgical History:  Procedure Laterality Date   CARDIAC CATHETERIZATION     CORONARY ARTERY BYPASS GRAFT N/A 03/28/2022   Procedure: CORONARY ARTERY BYPASS GRAFTING X4, USING LEFT INTERNAL MAMMARY ARTERY, LEFT AND RIGHT GREATER SAPHENOUS VEIN HARVESTED ENDOSCOPICALLY;  Surgeon: Alleen Borne, MD;  Location: [facility] OR;  Service: Open Heart Surgery;  Laterality: N/A;   FRACTURE SURGERY     HERNIA REPAIR     LEFT HEART CATH AND CORONARY ANGIOGRAPHY N/A 03/17/2022   Procedure: LEFT HEART CATH AND CORONARY ANGIOGRAPHY;  Surgeon: Lyn Records, MD;  Location: [facility] INVASIVE CV LAB;  Service: Cardiovascular;  Laterality: N/A;   TEE WITHOUT CARDIOVERSION N/A 03/28/2022   Procedure: TRANSESOPHAGEAL ECHOCARDIOGRAM (TEE);  Surgeon: Alleen Borne, MD;  [facility];  Service: Open Heart Surgery;  Laterality: N/A;    Current Medications: Current Meds  Medication  Sig   acetaminophen (TYLENOL) 500 MG tablet Take 1-2 tablets (500-1,000 mg total) by mouth every 6 (six) hours as needed.   atorvastatin (LIPITOR) 20 MG tablet Take 1 tablet (20 mg total) by mouth daily.   bisacodyl (DULCOLAX) 5 MG EC tablet Take 5 mg by mouth daily as needed for moderate constipation.   carvedilol (COREG) 12.5 MG tablet Take 1 tablet (12.5 mg total) by mouth 2 (two) times daily.   hydrochlorothiazide (MICROZIDE) 12.5 MG capsule Take 1 capsule (12.5 mg total) by mouth daily.   oxymetazoline (AFRIN) 0.05 % nasal spray Place 1 spray into both nostrils 2 (two) times daily as needed for congestion.   VITAMIN D PO Take 1 capsule by mouth daily.   [DISCONTINUED] metoprolol succinate (TOPROL XL) 25 MG 24 hr tablet Take 0.5 tablets (12.5 mg total) by mouth daily.   [DISCONTINUED] valsartan (DIOVAN) 40 MG tablet Take 1 tablet (40 mg total) by mouth daily.   Current Facility-Administered Medications for the 03/31/23 encounter (Office Visit) with Thomasene Ripple, DO  Medication   nitroGLYCERIN (NITROSTAT) SL tablet 0.4 mg     Allergies:   Lisinopril   Social History   Socioeconomic History   Marital status: Married    Spouse name: Not on file   Number of children: Not on file   Years of education: 16   Highest  education level: Bachelor's degree (e.g., BA, AB, BS)  Occupational History   Not on file  Tobacco Use   Smoking status: Former    Packs/day: 1.00    Years: 35.00    Additional pack years: 0.00    Total pack years: 35.00    Types: Cigarettes    Quit date: 03/27/2022    Years since quitting: 1.0   Smokeless tobacco: Never   Tobacco comments:    Quit on 03/27/22 and has not resumed smoking  Vaping Use   Vaping Use: Never used  Substance and Sexual Activity   Alcohol use: Not Currently    Alcohol/week: 3.0 - 4.0 standard drinks of alcohol    Types: 3 - 4 Cans of beer per week    Comment: not drinking currently   Drug use: No   Sexual activity: Yes  Other Topics  Concern   Not on file  Social History Narrative   Not on file   Social Determinants of Health   Financial Resource Strain: Not on file  Food Insecurity: Not on file  Transportation Needs: Not on file  Physical Activity: Not on file  Stress: Not on file  Social Connections: Not on file     Family History: The patient's family history includes Hypertension in his mother; Kidney disease in his mother; Lupus in his sister.  ROS:   Review of Systems  Constitution: Negative for decreased appetite, fever and weight gain.  HENT: Negative for congestion, ear discharge, hoarse voice and sore throat.   Eyes: Negative for discharge, redness, vision loss in right eye and visual halos.  Cardiovascular: Negative for chest pain, dyspnea on exertion, leg swelling, orthopnea and palpitations.  Respiratory: Negative for cough, hemoptysis, shortness of breath and snoring.   Endocrine: Negative for heat intolerance and polyphagia.  Hematologic/Lymphatic: Negative for bleeding problem. Does not bruise/bleed easily.  Skin: Negative for flushing, nail changes, rash and suspicious lesions.  Musculoskeletal: Negative for arthritis, joint pain, muscle cramps, myalgias, neck pain and stiffness.  Gastrointestinal: Negative for abdominal pain, bowel incontinence, diarrhea and excessive appetite.  Genitourinary: Negative for decreased libido, genital sores and incomplete emptying.  Neurological: Negative for brief paralysis, focal weakness, headaches and loss of balance.  Psychiatric/Behavioral: Negative for altered mental status, depression and suicidal ideas.  Allergic/Immunologic: Negative for HIV exposure and persistent infections.    EKGs/Labs/Other Studies Reviewed:    The following studies were reviewed today:   EKG:  None today  Recent Labs: 09/29/2022: ALT 17; Hemoglobin 12.4; Platelets 291 12/01/2022: BUN 19; Creatinine, Ser 1.59; Magnesium 1.9; Potassium 4.3; Sodium 140  Recent Lipid Panel     Component Value Date/Time   CHOL 139 05/26/2022 0924   TRIG 110 05/26/2022 0924   HDL 42 05/26/2022 0924   CHOLHDL 3.3 05/26/2022 0924   LDLCALC 77 05/26/2022 0924    Physical Exam:    VS:  BP (!) 145/80   Pulse 60   Ht 5\' 9"  (1.753 m)   Wt 204 lb (92.5 kg)   SpO2 100%   BMI 30.13 kg/m     Wt Readings from Last 3 Encounters:  03/31/23 204 lb (92.5 kg)  12/01/22 209 lb 6.4 oz (95 kg)  07/18/22 191 lb 5.8 oz (86.8 kg)     GEN: Well nourished, well developed in no acute distress HEENT: Normal NECK: No JVD; No carotid bruits LYMPHATICS: No lymphadenopathy CARDIAC: S1S2 noted,RRR, no murmurs, rubs, gallops RESPIRATORY:  Clear to auscultation without rales, wheezing or rhonchi  ABDOMEN:  Soft, non-tender, non-distended, +bowel sounds, no guarding. EXTREMITIES: No edema, No cyanosis, no clubbing MUSCULOSKELETAL:  No deformity  SKIN: Warm and dry NEUROLOGIC:  Alert and oriented x 3, non-focal PSYCHIATRIC:  Normal affect, good insight  ASSESSMENT:    1. Primary hypertension   2. CAD in native artery   3. Overweight (BMI 25.0-29.9)   4. S/P CABG x 4   5. Hyperlipidemia LDL goal <70      PLAN:    His blood pressure stable , he reports that he has had some back pain with the valsartan I like to stop the valsartan.  With this I will have to switch his metoprolol to Coreg 12.5 mg daily hoping to keep his blood pressure under control.  No angina symptoms.   Hyperlipidemia-continue his current statin dose.   He quit smoking since his event.  The patient is in agreement with the above plan. The patient left the office in stable condition.  The patient will follow up in 6 months or sooner if needed.   Medication Adjustments/Labs and Tests Ordered: Current medicines are reviewed at length with the patient today.  Concerns regarding medicines are outlined above.  No orders of the defined types were placed in this encounter.  Meds ordered this encounter  Medications    carvedilol (COREG) 12.5 MG tablet    Sig: Take 1 tablet (12.5 mg total) by mouth 2 (two) times daily.    Dispense:  180 tablet    Refill:  3    Patient Instructions  Medication Instructions:  Your physician has recommended you make the following change in your medication:  STOP: Valsartan  STOP: Metoprolol  START:  Carvedilol (Coreg) 12.5 mg twice daily *If you need a refill on your cardiac medications before your next appointment, please call your pharmacy*   Lab Work: None   Testing/Procedures: None   Follow-Up: At [facility], you and your health needs are our priority.  As part of our continuing mission to provide you with exceptional heart care, we have created designated Provider Care Teams.  These Care Teams include your primary Cardiologist (physician) and Advanced Practice Providers (APPs -  Physician Assistants and Nurse Practitioners) who all work together to provide you with the care you need, when you need it.   Your next appointment:   6 month(s)  Provider:   Thomasene Ripple, DO      Adopting a Healthy Lifestyle.  Know what a healthy weight is for you (roughly BMI <25) and aim to maintain this   Aim for 7+ servings of fruits and vegetables daily   65-80+ fluid ounces of water or unsweet tea for healthy kidneys   Limit to max 1 drink of alcohol per day; avoid smoking/tobacco   Limit animal fats in diet for cholesterol and heart health - choose grass fed whenever available   Avoid highly processed foods, and foods high in saturated/trans fats   Aim for low stress - take time to unwind and care for your mental health   Aim for 150 min of moderate intensity exercise weekly for heart health, and weights twice weekly for bone health   Aim for 7-9 hours of sleep daily   When it comes to diets, agreement about the perfect plan isnt easy to find, even among the experts. Experts at the [facility] of [employer] developed an idea known as the  Healthy Eating Plate. Just imagine a plate divided into logical, healthy portions.   The emphasis is  on diet quality:   Load up on vegetables and fruits - one-half of your plate: Aim for color and variety, and remember that potatoes dont count.   Go for whole grains - one-quarter of your plate: Whole wheat, barley, wheat berries, quinoa, oats, brown rice, and foods made with them. If you want pasta, go with whole wheat pasta.   Protein power - one-quarter of your plate: Fish, chicken, beans, and nuts are all healthy, versatile protein sources. Limit red meat.   The diet, however, does go beyond the plate, offering a few other suggestions.   Use healthy plant oils, such as olive, canola, soy, corn, sunflower and peanut. Check the labels, and avoid partially hydrogenated oil, which have unhealthy trans fats.   If youre thirsty, drink water. Coffee and tea are good in moderation, but skip sugary drinks and limit milk and dairy products to one or two daily servings.   The type of carbohydrate in the diet is more important than the amount. Some sources of carbohydrates, such as vegetables, fruits, whole grains, and beans-are healthier than others.   Finally, stay active  Signed, Thomasene Ripple, DO  03/31/2023 7:59 PM    [facility]

## 2023-04-05 ENCOUNTER — Telehealth: Payer: Self-pay | Admitting: *Deleted

## 2023-04-05 NOTE — Telephone Encounter (Signed)
Patient contacted the office via MyChart stating a suture is poking out near midline incision. Patient denies redness, pain, or drainage. Patient is s/p CABG 03/28/2022. Photo sent via MyChart and reviewed. Advised patient area of concern does not look like a suture. Patient denies appearance of suture to be silver or stainless steel. Advised patient that all sutures placed internally would have dissolved with the exception of the sternal wires. Advised patient that if area becomes worse to contact PCP. Patient verbalized understanding.

## 2023-05-14 IMAGING — DX DG CHEST 2V
2 series · 2 of 2 positions shown · non-contrast
Comparison: None Available.

CLINICAL DATA: Preoperative evaluation.

EXAM:
CHEST - 2 VIEW

[w chest pa]
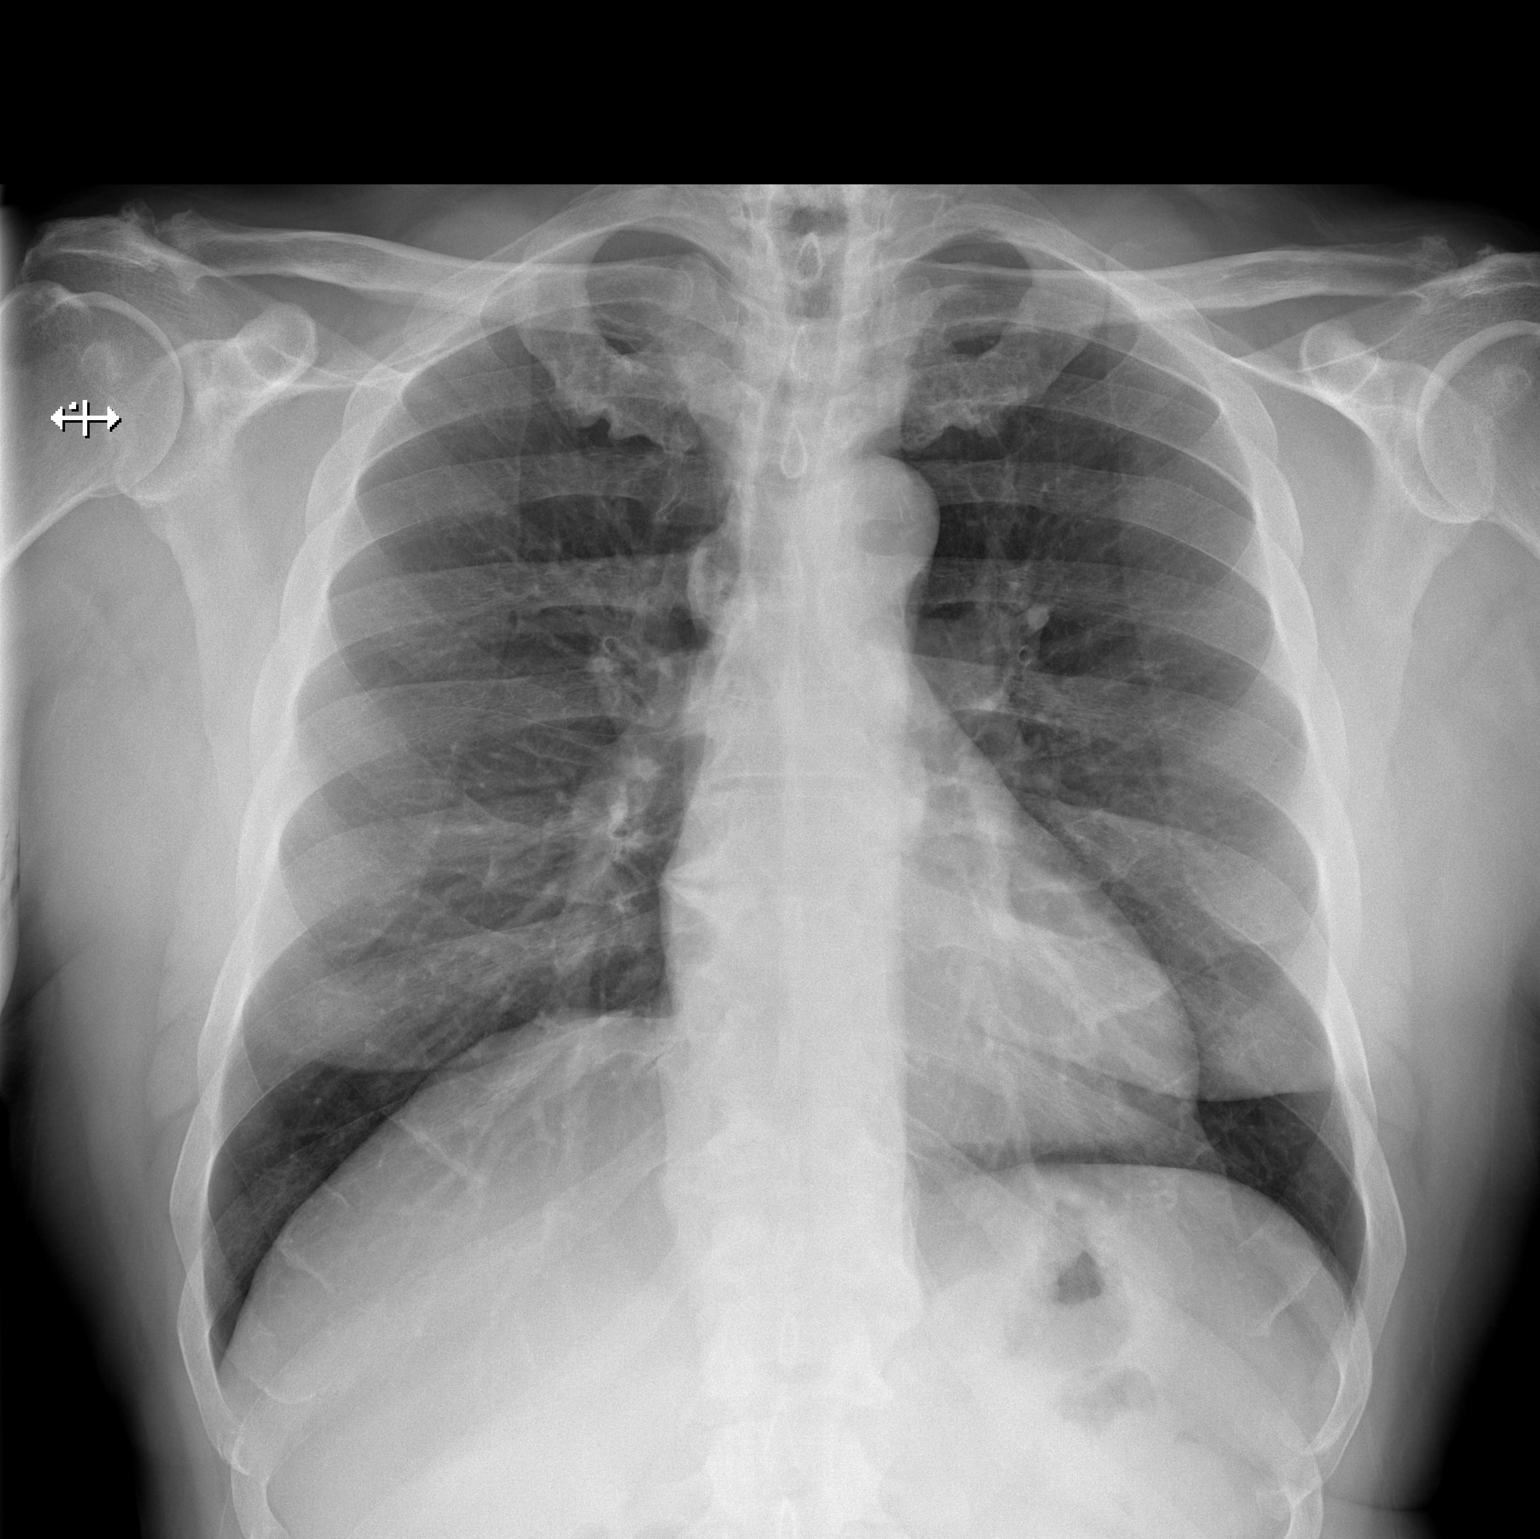

[w chest lat]
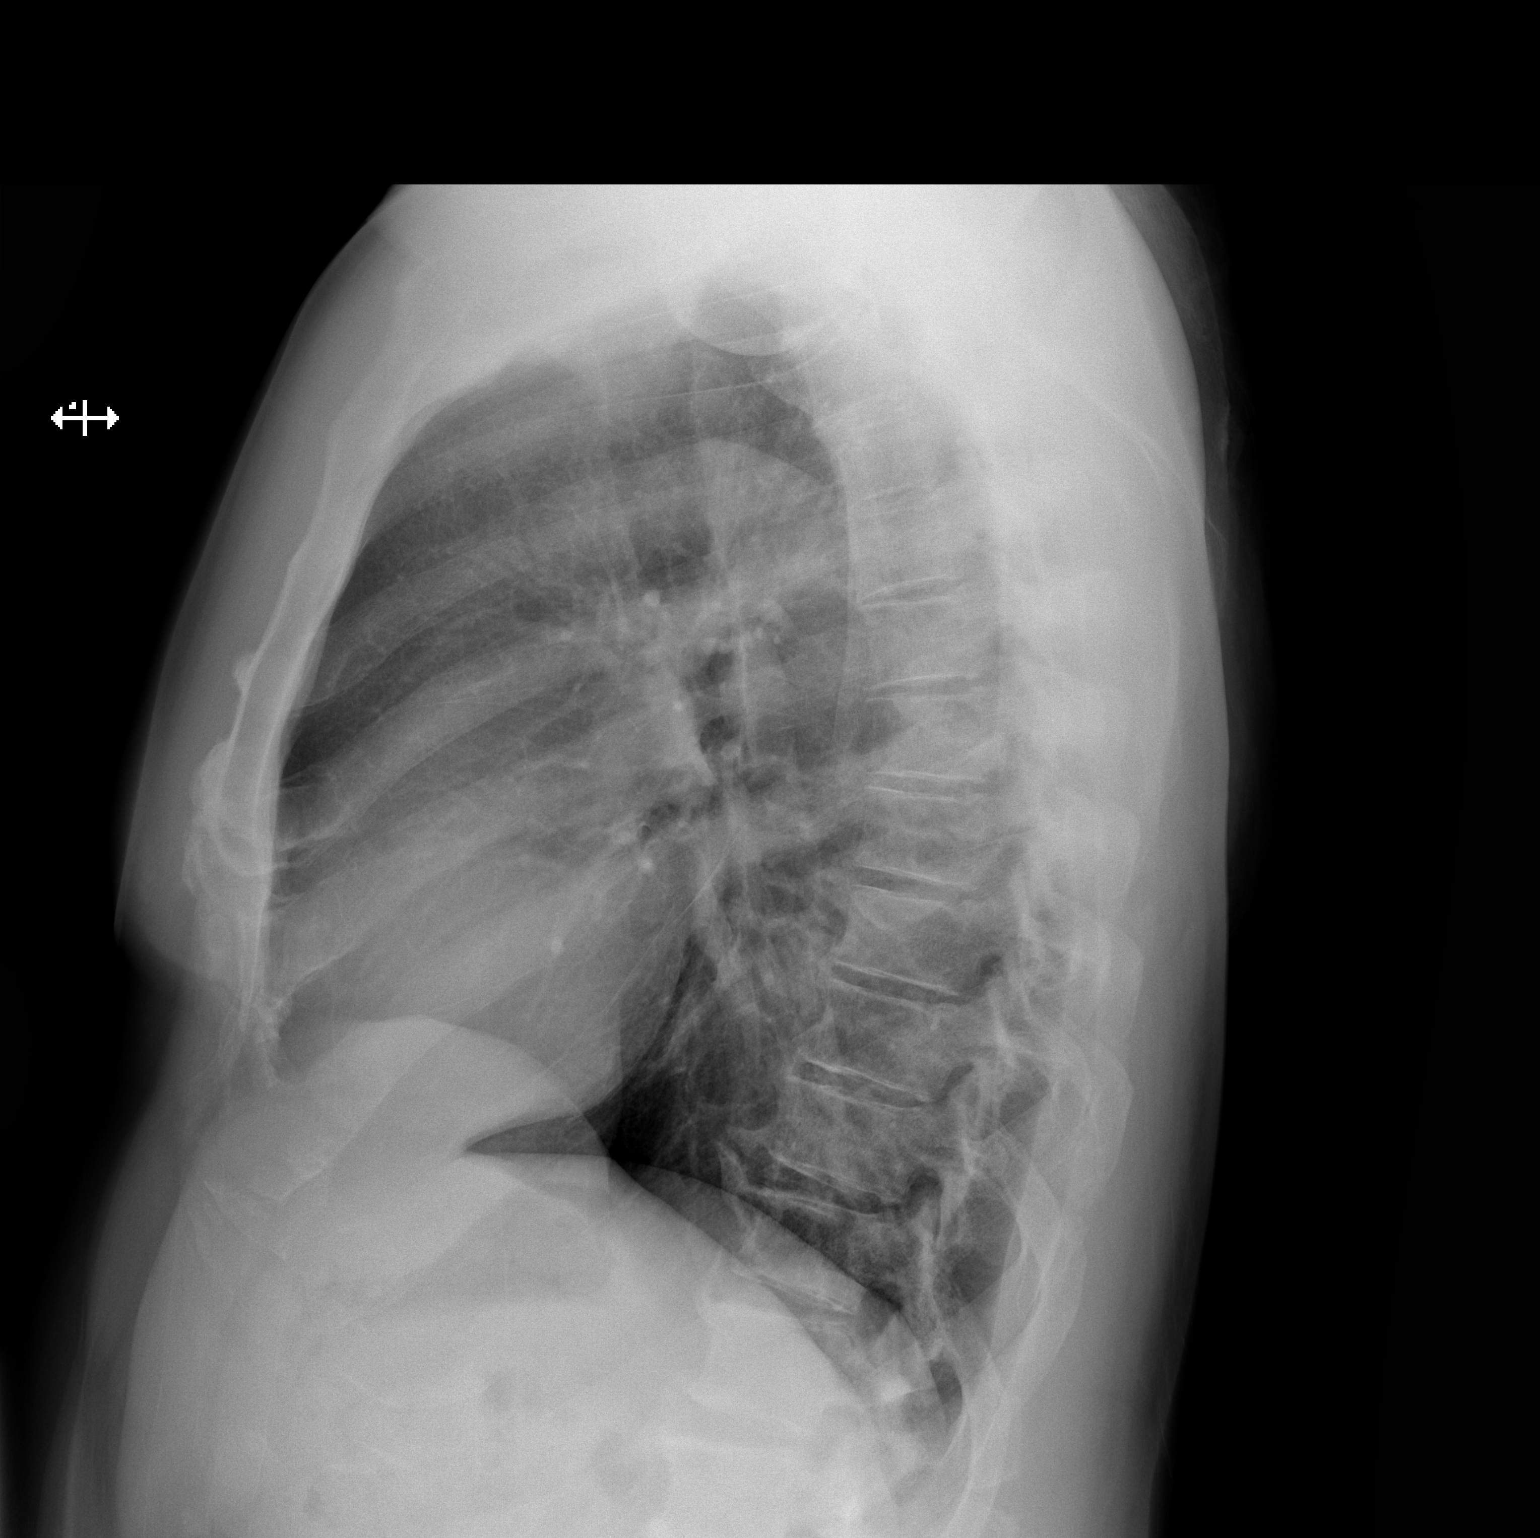

[2 of 2 positions shown; findings below may reference images not displayed]

FINDINGS: The heart size and mediastinal contours are within normal limits.
Both lungs are clear. The visualized skeletal structures are
unremarkable.
IMPRESSION: No active cardiopulmonary disease.

## 2023-05-18 ENCOUNTER — Telehealth: Payer: Self-pay | Admitting: Cardiovascular Disease

## 2023-05-18 ENCOUNTER — Telehealth: Payer: Self-pay | Admitting: Cardiology

## 2023-05-18 DIAGNOSIS — E785 Hyperlipidemia, unspecified: Secondary | ICD-10-CM

## 2023-05-18 DIAGNOSIS — I2510 Atherosclerotic heart disease of native coronary artery without angina pectoris: Secondary | ICD-10-CM

## 2023-05-18 DIAGNOSIS — I10 Essential (primary) hypertension: Secondary | ICD-10-CM

## 2023-05-18 NOTE — Telephone Encounter (Signed)
Patient wants to know if he will need to do any lab work/tests prior to his visit on 12/11.

## 2023-05-18 NOTE — Telephone Encounter (Signed)
Please advise labs prior to appt.  He has not had any with [place], most labs from 2023

## 2023-05-18 NOTE — Telephone Encounter (Signed)
Patient wants provider switch from Dr. Servando Salina to Dr. Royann Shivers.  Please confirm.

## 2023-05-18 NOTE — Telephone Encounter (Signed)
Please have him get a fasting lipid profile and CMET. Thank you

## 2023-05-18 NOTE — Telephone Encounter (Signed)
Spoke with patient, advised labs ordered and fasting, NPO after midnight with exception of water.  Advised no appointment needed for our office. Will have them done 7-10 days prior to appt.

## 2023-05-18 NOTE — Telephone Encounter (Signed)
No objections

## 2023-06-17 ENCOUNTER — Ambulatory Visit
Admission: EM | Admit: 2023-06-17 | Discharge: 2023-06-17 | Disposition: A | Discharge status: Home / Self Care | Payer: 59 | Attending: Internal Medicine | Admitting: Internal Medicine

## 2023-06-17 DIAGNOSIS — U071 COVID-19: Secondary | ICD-10-CM | POA: Insufficient documentation

## 2023-06-17 DIAGNOSIS — J029 Acute pharyngitis, unspecified: Secondary | ICD-10-CM | POA: Diagnosis not present

## 2023-06-17 DIAGNOSIS — J069 Acute upper respiratory infection, unspecified: Secondary | ICD-10-CM | POA: Diagnosis not present

## 2023-06-17 LAB — POCT INFLUENZA A/B
Influenza A, POC: NEGATIVE
Influenza B, POC: NEGATIVE

## 2023-06-17 LAB — POCT RAPID STREP A (OFFICE): Rapid Strep A Screen: NEGATIVE

## 2023-06-17 MED ORDER — CHLORASEPTIC 1.4 % MT LIQD: 1.0000 | OROMUCOSAL | 0 refills | Status: DC | PRN

## 2023-06-17 MED ORDER — BENZONATATE 100 MG PO CAPS: 100.0000 mg | ORAL_CAPSULE | Freq: Three times a day (TID) | ORAL | 0 refills | Status: DC | PRN

## 2023-06-17 NOTE — ED Triage Notes (Signed)
Pt states cough and congestion for the past 2 days.  States he has been taking Mucinex at home.

## 2023-06-17 NOTE — Discharge Instructions (Addendum)
Rapid strep and rapid flu are negative.  Throat culture and COVID test are pending.  It appears to have a viral illness that will run its course.  I prescribed you 2 medications to help alleviate symptoms.  Follow-up if any symptoms persist or worsen.

## 2023-06-17 NOTE — ED Provider Notes (Signed)
[name]    CSN: [id number] Arrival date & time: 06/17/23  1040      History   Chief Complaint Chief Complaint  Patient presents with   Nasal Congestion    HPI John Delacruz is a 62 y.o. male.   Patient presents with cough, congestion, chills, sore throat that started 2 days ago.  Denies any known sick contacts.  Tmax at home has been 100.  Denies chest pain, shortness of breath.  Denies history of asthma or COPD and patient does not smoke cigarettes.  Patient has taken Mucinex and aspirin for symptoms.     Past Medical History:  Diagnosis Date   Anginal pain (HCC) 02/2022   Coronary artery disease    Hypertension     Patient Active Problem List   Diagnosis Date Noted   S/P CABG x 4 03/28/2022   CAD in native artery 03/17/2022   Angina pectoris (HCC)    Nonspecific abnormal electrocardiogram (ECG) (EKG) 02/28/2022   Overweight (BMI 25.0-29.9) 02/28/2022   Precordial pain 02/28/2022   Diverticulitis 12/18/2013   HTN (hypertension) 01/01/2013    Past Surgical History:  Procedure Laterality Date   CARDIAC CATHETERIZATION     CORONARY ARTERY BYPASS GRAFT N/A 03/28/2022   Procedure: CORONARY ARTERY BYPASS GRAFTING X4, USING LEFT INTERNAL MAMMARY ARTERY, LEFT AND RIGHT GREATER SAPHENOUS VEIN HARVESTED ENDOSCOPICALLY;  Surgeon: Alleen Borne, MD;  Location: [facility] OR;  Service: Open Heart Surgery;  Laterality: N/A;   FRACTURE SURGERY     HERNIA REPAIR     LEFT HEART CATH AND CORONARY ANGIOGRAPHY N/A 03/17/2022   Procedure: LEFT HEART CATH AND CORONARY ANGIOGRAPHY;  Surgeon: Lyn Records, MD;  Location: [facility] INVASIVE CV LAB;  Service: Cardiovascular;  Laterality: N/A;   TEE WITHOUT CARDIOVERSION N/A 03/28/2022   Procedure: TRANSESOPHAGEAL ECHOCARDIOGRAM (TEE);  Surgeon: Alleen Borne, MD;  Location: [facility] OR;  Service: Open Heart Surgery;  Laterality: N/A;       Home Medications    Prior to Admission medications   Medication Sig Start Date End Date  Taking? Authorizing Provider  benzonatate (TESSALON) 100 MG capsule Take 1 capsule (100 mg total) by mouth every 8 (eight) hours as needed for cough. 06/17/23  Yes Felecia Stanfill, Rolly Salter E, FNP  phenol (CHLORASEPTIC) 1.4 % LIQD Use as directed 1 spray in the mouth or throat as needed for throat irritation / pain. 06/17/23  Yes Avalie Oconnor, Rolly Salter E, FNP  acetaminophen (TYLENOL) 500 MG tablet Take 1-2 tablets (500-1,000 mg total) by mouth every 6 (six) hours as needed. 04/01/22   Barrett, Erin R, PA-C  atorvastatin (LIPITOR) 20 MG tablet Take 1 tablet (20 mg total) by mouth daily. 06/29/22   Tobb, Kardie, DO  bisacodyl (DULCOLAX) 5 MG EC tablet Take 5 mg by mouth daily as needed for moderate constipation.    [provider]  carvedilol (COREG) 12.5 MG tablet Take 1 tablet (12.5 mg total) by mouth 2 (two) times daily. 03/31/23 06/29/23  Tobb, Kardie, DO  hydrochlorothiazide (MICROZIDE) 12.5 MG capsule Take 1 capsule (12.5 mg total) by mouth daily. 06/13/22   Tobb, Kardie, DO  oxymetazoline (AFRIN) 0.05 % nasal spray Place 1 spray into both nostrils 2 (two) times daily as needed for congestion.    [provider]  VITAMIN D PO Take 1 capsule by mouth daily.    [provider]    Family History Family History  Problem Relation Age of Onset   Hypertension Mother    Kidney disease  Mother    Lupus Sister     Social History Social History   Tobacco Use   Smoking status: Former    Current packs/day: 0.00    Average packs/day: 1 pack/day for 35.0 years (35.0 ttl pk-yrs)    Types: Cigarettes    Start date: 03/28/1987    Quit date: 03/27/2022    Years since quitting: 1.2   Smokeless tobacco: Never   Tobacco comments:    Quit on 03/27/22 and has not resumed smoking  Vaping Use   Vaping status: Never Used  Substance Use Topics   Alcohol use: Not Currently    Alcohol/week: 3.0 - 4.0 standard drinks of alcohol    Types: 3 - 4 Cans of beer per week    Comment: not drinking currently   Drug use: No      Allergies   Lisinopril   Review of Systems Review of Systems Per HPI  Physical Exam Triage Vital Signs ED Triage Vitals  Encounter Vitals Group     BP 06/17/23 1056 120/73     Systolic BP Percentile --      Diastolic BP Percentile --      Pulse Rate 06/17/23 1056 63     Resp 06/17/23 1056 16     Temp 06/17/23 1056 100.1 F (37.8 C)     Temp Source 06/17/23 1056 Oral     SpO2 06/17/23 1056 97 %     Weight --      Height --      Head Circumference --      Peak Flow --      Pain Score 06/17/23 1058 0     Pain Loc --      Pain Education --      Exclude from Growth Chart --    No data found.  Updated Vital Signs BP 120/73 (BP Location: Left Arm)   Pulse 63   Temp 100.1 F (37.8 C) (Oral)   Resp 16   SpO2 97%   Visual Acuity Right Eye Distance:   Left Eye Distance:   Bilateral Distance:    Right Eye Near:   Left Eye Near:    Bilateral Near:     Physical Exam Constitutional:      General: He is not in acute distress.    Appearance: Normal appearance. He is not toxic-appearing or diaphoretic.  HENT:     Head: Normocephalic and atraumatic.     Right Ear: Tympanic membrane and ear canal normal.     Left Ear: Tympanic membrane and ear canal normal.     Nose: Congestion present.     Mouth/Throat:     Mouth: Mucous membranes are moist.     Pharynx: Posterior oropharyngeal erythema present.  Eyes:     Extraocular Movements: Extraocular movements intact.     Conjunctiva/sclera: Conjunctivae normal.     Pupils: Pupils are equal, round, and reactive to light.  Cardiovascular:     Rate and Rhythm: Normal rate and regular rhythm.     Pulses: Normal pulses.     Heart sounds: Normal heart sounds.  Pulmonary:     Effort: Pulmonary effort is normal. No respiratory distress.     Breath sounds: Normal breath sounds. No stridor. No wheezing, rhonchi or rales.  Abdominal:     General: Abdomen is flat. Bowel sounds are normal.     Palpations: Abdomen is soft.   Musculoskeletal:        General: Normal range of motion.  Cervical back: Normal range of motion.  Skin:    General: Skin is warm and dry.  Neurological:     General: No focal deficit present.     Mental Status: He is alert and oriented to person, place, and time. Mental status is at baseline.  Psychiatric:        Mood and Affect: Mood normal.        Behavior: Behavior normal.      UC Treatments / Results  Labs (all labs ordered are listed, but only abnormal results are displayed) Labs Reviewed  CULTURE, GROUP A STREP ([facility])  SARS CORONAVIRUS 2 (TAT 6-24 HRS)  POCT RAPID STREP A (OFFICE)  POCT INFLUENZA A/B    EKG   Radiology No results found.  Procedures Procedures (including critical care time)  Medications Ordered in UC Medications - No data to display  Initial Impression / Assessment and Plan / UC Course  I have reviewed the triage vital signs and the nursing notes.  Pertinent labs & imaging results that were available during my care of the patient were reviewed by me and considered in my medical decision making (see chart for details).     Patient presents with symptoms likely from a viral upper respiratory infection.  Do not suspect underlying cardiopulmonary process. Symptoms seem unlikely related to ACS, CHF or COPD exacerbations, pneumonia, pneumothorax. Patient is nontoxic appearing and not in need of emergent medical intervention.  Rapid flu and rapid strep are negative.  Throat culture and COVID test pending.  Recommended symptom control with medications and supportive care.  Patient was sent prescriptions for cough and sore throat.  Return if symptoms fail to improve in 1-2 weeks or you develop shortness of breath, chest pain, severe headache. Patient states understanding and is agreeable.  Discharged with PCP followup.  Final Clinical Impressions(s) / UC Diagnoses   Final diagnoses:  Viral upper respiratory tract infection with cough  Sore  throat     Discharge Instructions      Rapid strep and rapid flu are negative.  Throat culture and COVID test are pending.  It appears to have a viral illness that will run its course.  I prescribed you 2 medications to help alleviate symptoms.  Follow-up if any symptoms persist or worsen.     ED Prescriptions     Medication Sig Dispense Auth. Provider   benzonatate (TESSALON) 100 MG capsule Take 1 capsule (100 mg total) by mouth every 8 (eight) hours as needed for cough. 21 capsule Gully, [place] E, [place]   phenol (CHLORASEPTIC) 1.4 % LIQD Use as directed 1 spray in the mouth or throat as needed for throat irritation / pain. 118 mL Gustavus Bryant, [place]      PDMP not reviewed this encounter.   Gustavus Bryant, [place] 06/17/23 1157

## 2023-06-18 LAB — SARS CORONAVIRUS 2 (TAT 6-24 HRS): SARS Coronavirus 2: POSITIVE — AB

## 2023-06-20 LAB — CULTURE, GROUP A STREP (THRC)

## 2023-06-28 DIAGNOSIS — Z1211 Encounter for screening for malignant neoplasm of colon: Secondary | ICD-10-CM | POA: Diagnosis not present

## 2023-06-28 DIAGNOSIS — Z0000 Encounter for general adult medical examination without abnormal findings: Secondary | ICD-10-CM | POA: Diagnosis not present

## 2023-06-28 DIAGNOSIS — I10 Essential (primary) hypertension: Secondary | ICD-10-CM | POA: Diagnosis not present

## 2023-06-28 DIAGNOSIS — R739 Hyperglycemia, unspecified: Secondary | ICD-10-CM | POA: Diagnosis not present

## 2023-06-28 DIAGNOSIS — N1831 Chronic kidney disease, stage 3a: Secondary | ICD-10-CM | POA: Diagnosis not present

## 2023-06-28 DIAGNOSIS — E785 Hyperlipidemia, unspecified: Secondary | ICD-10-CM | POA: Diagnosis not present

## 2023-07-07 ENCOUNTER — Ambulatory Visit: Payer: Self-pay | Admitting: Cardiology

## 2023-07-07 DIAGNOSIS — E785 Hyperlipidemia, unspecified: Secondary | ICD-10-CM | POA: Diagnosis not present

## 2023-07-07 DIAGNOSIS — N1831 Chronic kidney disease, stage 3a: Secondary | ICD-10-CM | POA: Diagnosis not present

## 2023-07-07 DIAGNOSIS — I129 Hypertensive chronic kidney disease with stage 1 through stage 4 chronic kidney disease, or unspecified chronic kidney disease: Secondary | ICD-10-CM | POA: Diagnosis not present

## 2023-07-07 DIAGNOSIS — Z8249 Family history of ischemic heart disease and other diseases of the circulatory system: Secondary | ICD-10-CM | POA: Diagnosis not present

## 2023-07-07 DIAGNOSIS — Z87891 Personal history of nicotine dependence: Secondary | ICD-10-CM | POA: Diagnosis not present

## 2023-07-07 DIAGNOSIS — Z973 Presence of spectacles and contact lenses: Secondary | ICD-10-CM | POA: Diagnosis not present

## 2023-07-07 DIAGNOSIS — I2510 Atherosclerotic heart disease of native coronary artery without angina pectoris: Secondary | ICD-10-CM | POA: Diagnosis not present

## 2023-07-07 DIAGNOSIS — Z7982 Long term (current) use of aspirin: Secondary | ICD-10-CM | POA: Diagnosis not present

## 2023-07-13 ENCOUNTER — Other Ambulatory Visit: Payer: Self-pay | Admitting: Cardiology

## 2023-08-13 ENCOUNTER — Encounter: Payer: Self-pay | Admitting: *Deleted

## 2023-08-13 ENCOUNTER — Other Ambulatory Visit: Payer: Self-pay

## 2023-08-13 ENCOUNTER — Ambulatory Visit
Admission: EM | Admit: 2023-08-13 | Discharge: 2023-08-13 | Disposition: A | Discharge status: Home / Self Care | Payer: 59 | Attending: Internal Medicine | Admitting: Internal Medicine

## 2023-08-13 DIAGNOSIS — Z23 Encounter for immunization: Secondary | ICD-10-CM

## 2023-08-13 DIAGNOSIS — S90851A Superficial foreign body, right foot, initial encounter: Secondary | ICD-10-CM

## 2023-08-13 MED ORDER — TETANUS-DIPHTH-ACELL PERTUSSIS 5-2.5-18.5 LF-MCG/0.5 IM SUSY
0.5000 mL | PREFILLED_SYRINGE | Freq: Once | INTRAMUSCULAR | Status: AC
  Administered 2023-08-13: 0.5 mL via INTRAMUSCULAR

## 2023-08-13 NOTE — ED Triage Notes (Signed)
States he was barefoot in his house and stepped on something. He was unable to remove it. Small ?splinter noted in bottom of right foot. Pt ambulatory. No wound noted

## 2023-08-13 NOTE — ED Provider Notes (Signed)
[name]    CSN: [id number] Arrival date & time: 08/13/23  1147      History   Chief Complaint Chief Complaint  Patient presents with   Foreign Body in Skin    HPI John Delacruz is a 62 y.o. male.   Patient presents for concern of foreign body in the bottom of his right foot that occurred approximately 2 hours prior to arrival to urgent care.  Patient reports that he was walking barefoot in his house when he stepped on an unknown object.  It is minimally painful.  He is not sure of his last tetanus vaccination.  Denies history of diabetes.  He is not sure how large the foreign body is but states that he thinks it was small.     Past Medical History:  Diagnosis Date   Anginal pain (HCC) 02/2022   Coronary artery disease    Hypertension     Patient Active Problem List   Diagnosis Date Noted   S/P CABG x 4 03/28/2022   CAD in native artery 03/17/2022   Angina pectoris (HCC)    Nonspecific abnormal electrocardiogram (ECG) (EKG) 02/28/2022   Overweight (BMI 25.0-29.9) 02/28/2022   Precordial pain 02/28/2022   Diverticulitis 12/18/2013   HTN (hypertension) 01/01/2013    Past Surgical History:  Procedure Laterality Date   CARDIAC CATHETERIZATION     CORONARY ARTERY BYPASS GRAFT N/A 03/28/2022   Procedure: CORONARY ARTERY BYPASS GRAFTING X4, USING LEFT INTERNAL MAMMARY ARTERY, LEFT AND RIGHT GREATER SAPHENOUS VEIN HARVESTED ENDOSCOPICALLY;  Surgeon: Alleen Borne, MD;  Location: [facility] OR;  Service: Open Heart Surgery;  Laterality: N/A;   FRACTURE SURGERY     HERNIA REPAIR     LEFT HEART CATH AND CORONARY ANGIOGRAPHY N/A 03/17/2022   Procedure: LEFT HEART CATH AND CORONARY ANGIOGRAPHY;  Surgeon: Lyn Records, MD;  Location: [facility] INVASIVE CV LAB;  Service: Cardiovascular;  Laterality: N/A;   TEE WITHOUT CARDIOVERSION N/A 03/28/2022   Procedure: TRANSESOPHAGEAL ECHOCARDIOGRAM (TEE);  Surgeon: Alleen Borne, MD;  Location: [facility] OR;  Service: Open Heart Surgery;   Laterality: N/A;       Home Medications    Prior to Admission medications   Medication Sig Start Date End Date Taking? Authorizing Provider  acetaminophen (TYLENOL) 500 MG tablet Take 1-2 tablets (500-1,000 mg total) by mouth every 6 (six) hours as needed. 04/01/22   Barrett, Erin R, PA-C  atorvastatin (LIPITOR) 20 MG tablet TAKE 1 TABLET BY MOUTH EVERY DAY 07/13/23   Tobb, Kardie, DO  benzonatate (TESSALON) 100 MG capsule Take 1 capsule (100 mg total) by mouth every 8 (eight) hours as needed for cough. 06/17/23   Gustavus Bryant, FNP  bisacodyl (DULCOLAX) 5 MG EC tablet Take 5 mg by mouth daily as needed for moderate constipation.    [provider]  carvedilol (COREG) 12.5 MG tablet Take 1 tablet (12.5 mg total) by mouth 2 (two) times daily. 03/31/23 06/29/23  Tobb, Kardie, DO  hydrochlorothiazide (MICROZIDE) 12.5 MG capsule TAKE 1 CAPSULE BY MOUTH EVERY DAY 07/13/23   Tobb, Kardie, DO  oxymetazoline (AFRIN) 0.05 % nasal spray Place 1 spray into both nostrils 2 (two) times daily as needed for congestion.    [provider]  phenol (CHLORASEPTIC) 1.4 % LIQD Use as directed 1 spray in the mouth or throat as needed for throat irritation / pain. 06/17/23   Gustavus Bryant, FNP  VITAMIN D PO Take 1 capsule by mouth daily.  [provider]    Family History Family History  Problem Relation Age of Onset   Hypertension Mother    Kidney disease Mother    Lupus Sister     Social History Social History   Tobacco Use   Smoking status: Former    Current packs/day: 0.00    Average packs/day: 1 pack/day for 35.0 years (35.0 ttl pk-yrs)    Types: Cigarettes    Start date: 03/28/1987    Quit date: 03/27/2022    Years since quitting: 1.3   Smokeless tobacco: Never   Tobacco comments:    Quit on 03/27/22 and has not resumed smoking  Vaping Use   Vaping status: Never Used  Substance Use Topics   Alcohol use: Not Currently    Alcohol/week: 3.0 - 4.0 standard drinks of  alcohol    Types: 3 - 4 Cans of beer per week    Comment: not drinking currently   Drug use: No     Allergies   Lisinopril   Review of Systems Review of Systems Per HPI  Physical Exam Triage Vital Signs ED Triage Vitals [08/13/23 1223]  Encounter Vitals Group     BP 136/77     Systolic BP Percentile      Diastolic BP Percentile      Pulse Rate (!) 54     Resp 16     Temp 98.5 F (36.9 C)     Temp Source Oral     SpO2 99 %     Weight      Height      Head Circumference      Peak Flow      Pain Score 1     Pain Loc      Pain Education      Exclude from Growth Chart    No data found.  Updated Vital Signs BP 136/77 (BP Location: Right Arm)   Pulse (!) 54   Temp 98.5 F (36.9 C) (Oral)   Resp 16   SpO2 99%   Visual Acuity Right Eye Distance:   Left Eye Distance:   Bilateral Distance:    Right Eye Near:   Left Eye Near:    Bilateral Near:     Physical Exam Constitutional:      General: He is not in acute distress.    Appearance: Normal appearance. He is not toxic-appearing or diaphoretic.  HENT:     Head: Normocephalic and atraumatic.  Eyes:     Extraocular Movements: Extraocular movements intact.     Conjunctiva/sclera: Conjunctivae normal.  Pulmonary:     Effort: Pulmonary effort is normal.  Musculoskeletal:       Feet:  Feet:     Comments: Patient has a black-colored unknown foreign body that appears to be pinpoint to the plantar surface of the right foot directly beneath the first great toe.  Patient can wiggle toes and is able to bear weight.  Appears to be neurovascular intact.  No bleeding noted. No swelling noted.  Neurological:     General: No focal deficit present.     Mental Status: He is alert and oriented to person, place, and time. Mental status is at baseline.  Psychiatric:        Mood and Affect: Mood normal.        Behavior: Behavior normal.        Thought Content: Thought content normal.        Judgment: Judgment normal.  UC Treatments / Results  Labs (all labs ordered are listed, but only abnormal results are displayed) Labs Reviewed - No data to display  EKG   Radiology No results found.  Procedures Procedures (including critical care time)  Medications Ordered in UC Medications  Tdap (BOOSTRIX) injection 0.5 mL (has no administration in time range)    Initial Impression / Assessment and Plan / UC Course  I have reviewed the triage vital signs and the nursing notes.  Pertinent labs & imaging results that were available during my care of the patient were reviewed by me and considered in my medical decision making (see chart for details).     Attempted to remove foreign body with forceps but was unsuccessful.  Appears to be too deep to remove safely at this time.  It appears very small so imaging was deferred at it is most likely not radiopaque.  Will have patient use warm Epsom salt soaks to see if this will help move foreign body to the surface. Advised patient to follow up with urgent care or podiatry if he is unable to remove it himself.  Tetanus vaccine updated today.  Advised to monitor for signs of infection as well.  Patient verbalized understanding and was agreeable with plan. Final Clinical Impressions(s) / UC Diagnoses   Final diagnoses:  Foreign body in right foot, initial encounter     Discharge Instructions      Please use warm Epsom salt soaks to see if this will bring the foreign body to the surface to be removed.  You may follow-up with podiatry as well.    ED Prescriptions   None    PDMP not reviewed this encounter.   Gustavus Bryant, [place] 08/13/23 [phone]

## 2023-08-13 NOTE — Discharge Instructions (Signed)
Please use warm Epsom salt soaks to see if this will bring the foreign body to the surface to be removed.  You may follow-up with podiatry as well.

## 2023-08-18 ENCOUNTER — Ambulatory Visit: Payer: 59 | Admitting: Podiatry

## 2023-08-18 ENCOUNTER — Ambulatory Visit (INDEPENDENT_AMBULATORY_CARE_PROVIDER_SITE_OTHER): Payer: 59

## 2023-08-18 ENCOUNTER — Encounter: Payer: Self-pay | Admitting: Podiatry

## 2023-08-18 VITALS — BP 161/82 | HR 54

## 2023-08-18 DIAGNOSIS — M795 Residual foreign body in soft tissue: Secondary | ICD-10-CM

## 2023-08-18 NOTE — Progress Notes (Signed)
Subjective:   Patient ID: John Delacruz, male   DOB: 62 y.o.   MRN: [id number]   HPI Patient states he has something stuck in the bottom of his right foot and it has been 5 days and it is sore.  He did go to urgent care who was not able to do anything for him and referred him over to [place].  Patient is not currently smoking likes to be active   Review of Systems  All other systems reviewed and are negative.       Objective:  Physical Exam Vitals and nursing note reviewed.  Constitutional:      Appearance: He is well-developed.  Pulmonary:     Effort: Pulmonary effort is normal.  Musculoskeletal:        General: Normal range of motion.  Skin:    General: Skin is warm.  Neurological:     Mental Status: He is alert.     Neurovascular status intact muscle strength adequate range of motion within normal limits with patient found to have an area underneath the right first metatarsal head just proximal to this with some irritative type tissue localized to this area.  No proximal edema erythema or drainage noted     Assessment:  For foreign body of the plantar left     Plan:  H&P x-ray reviewed sterile prep done anesthetic administered and using sterile instrumentation I open the area up and was able to take out a small amount of foreign body flushed and applied padding to the area instructed on soaks.  If symptoms were to recur or pathology occurs he is to reappoint if not this should heal uneventfully over the next week  X-rays indicate no signs of metallic foreign body plantar right moderate flatfoot deformity

## 2023-09-05 ENCOUNTER — Other Ambulatory Visit: Payer: Self-pay | Admitting: Physician Assistant

## 2023-09-05 ENCOUNTER — Telehealth: Payer: Self-pay | Admitting: Physician Assistant

## 2023-09-05 MED ORDER — METHYLPREDNISOLONE 4 MG PO TBPK: ORAL_TABLET | ORAL | 0 refills | Status: DC

## 2023-09-05 NOTE — Telephone Encounter (Signed)
Patient called asked if he could get a call back concerning lower back pain he is experiencing. The number to contact patient is [phone]

## 2023-10-28 ENCOUNTER — Encounter: Payer: Self-pay | Admitting: Cardiovascular Disease

## 2023-11-01 ENCOUNTER — Ambulatory Visit: Payer: 59 | Attending: Cardiovascular Disease | Admitting: Cardiovascular Disease

## 2023-11-01 ENCOUNTER — Encounter: Payer: Self-pay | Admitting: Cardiovascular Disease

## 2023-11-01 VITALS — BP 132/80 | HR 49 | Ht 69.0 in | Wt 209.0 lb

## 2023-11-01 DIAGNOSIS — E785 Hyperlipidemia, unspecified: Secondary | ICD-10-CM

## 2023-11-01 DIAGNOSIS — I2510 Atherosclerotic heart disease of native coronary artery without angina pectoris: Secondary | ICD-10-CM

## 2023-11-01 DIAGNOSIS — I10 Essential (primary) hypertension: Secondary | ICD-10-CM | POA: Diagnosis not present

## 2023-11-01 MED ORDER — ATORVASTATIN CALCIUM 40 MG PO TABS: 40.0000 mg | ORAL_TABLET | Freq: Every day | ORAL | 3 refills | Status: DC

## 2023-11-01 MED ORDER — CARVEDILOL 6.25 MG PO TABS: 6.2500 mg | ORAL_TABLET | Freq: Two times a day (BID) | ORAL | 3 refills | Status: DC

## 2023-11-01 NOTE — Progress Notes (Signed)
Cardiology Office Note:  .   Date:  11/01/2023  ID:  Dina Rich, DOB 1961/02/01, MRN [id number] PCP: Mila Palmer, MD  [facility] Providers Cardiologist:  Thurmon Fair, MD    History of Present Illness: .   John Delacruz is a 62 y.o. male with CAD (CABG x 4, LIMA-LAD, SVG-OM1, SVG-OM2, SVG-distal RCA, [place] 03/28/2022), dyslipidemia, HTN, preserved left ventricular systolic function, returning in follow-up.  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  He has been working hard at staying fit and exercises 5 or 6 days a week including biking about 25 miles a week and lifting weights.  On the other hand he has been less careful with his diet and has gained weight.  He has occasional chest soreness at the end of the day when he is lifting weights and has occasional twinges going through the front of his chest when he changes position, but he has not had any of the exertional retrosternal discomfort that he had prior to bypass surgery.  His most recent lipid profile from August shows HDL 39 and LDL 83, normal triglycerides as well as a hemoglobin A1c of 6.7%.  He is not on any medications for diabetes.  His creatinine is stable at 1.59 (GFR around 50).  ROS: The patient specifically denies any chest pain with exertion, dyspnea at rest or with exertion, orthopnea, paroxysmal nocturnal dyspnea, syncope, palpitations, focal neurological deficits, intermittent claudication, lower extremity edema, unexplained weight gain, cough, hemoptysis or wheezing.   Studies Reviewed: Marland Kitchen   EKG Interpretation Date/Time:  Wednesday November 01 2023 08:20:36 EST Ventricular Rate:  49 PR Interval:  182 QRS Duration:  78 QT Interval:  434 QTC Calculation: 392 R Axis:   -11  Text Interpretation: Sinus bradycardia Septal infarct (cited on or before 10-Apr-2022) When compared with ECG of 10-Apr-2022 23:04, Vent. rate has decreased BY  33 BPM Questionable change in QRS axis Nonspecific T wave  abnormality no longer evident in Inferior leads QT has shortened Confirmed by Chayla Shands [phone]) on 11/01/2023 8:27:30 AM    Echocardiogram Risk Assessment/Calculations:             Physical Exam:   VS:  BP 132/80 (BP Location: Left Arm, Patient Position: Sitting, Cuff Size: Large)   Pulse (!) 49   Ht 5\' 9"  (1.753 m)   Wt 209 lb (94.8 kg)   SpO2 98%   BMI 30.86 kg/m    Wt Readings from Last 3 Encounters:  11/01/23 209 lb (94.8 kg)  03/31/23 204 lb (92.5 kg)  12/01/22 209 lb 6.4 oz (95 kg)    GEN: Well nourished, well developed in no acute distress he is mildly obese, but also appears quite muscular.  He is wearing heavy work boots.  This may exaggerate his weight. NECK: No JVD; No carotid bruits CARDIAC: RRR, no murmurs, rubs, gallops RESPIRATORY:  Clear to auscultation without rales, wheezing or rhonchi  ABDOMEN: Soft, non-tender, non-distended EXTREMITIES:  No edema; No deformity   ASSESSMENT AND PLAN: .   CAD s/p CABG: He has some chest discomfort that sounds clearly musculoskeletal, not anginal.  The focus is on long-term risk modification.  Continue physical activity.  Try to lose some weight.  Continue lipid-lowering medications and healthy diet.  Continue the beta-blocker, but decrease the dose due to bradycardia. HLP: LDL cholesterol is a little high.  Target LDL less than 70.  Recommend increasing the atorvastatin to 40 mg daily.  Try to lose some weight, which may  help boost his HDL.  Recheck in 3 months. HTN: Well-controlled..  Suspect that will be the case even when he cuts back on the beta-blocker.       Dispo:  Patient Instructions  Medication Instructions:  Decrease Carvedilol to 6.25 mg twice a day Increase Atorvastatin to 40 mg daily *If you need a refill on your cardiac medications before your next appointment, please call your pharmacy*   Lab Work: Fasting Lipid panel- Please return for Blood Work in 3 months. No appointment needed, lab here at the  office is open Monday-Friday from 8AM to 4PM and closed daily for lunch from 12:45-1:45.   If you have labs (blood work) drawn today and your tests are completely normal, you will receive your results only by: MyChart Message (if you have MyChart) OR A paper copy in the mail If you have any lab test that is abnormal or we need to change your treatment, we will call you to review the results.   Follow-Up: At [facility], you and your health needs are our priority.  As part of our continuing mission to provide you with exceptional heart care, we have created designated Provider Care Teams.  These Care Teams include your primary Cardiologist (physician) and Advanced Practice Providers (APPs -  Physician Assistants and Nurse Practitioners) who all work together to provide you with the care you need, when you need it.  We recommend signing up for the patient portal called "MyChart".  Sign up information is provided on this After Visit Summary.  MyChart is used to connect with patients for Virtual Visits (Telemedicine).  Patients are able to view lab/test results, encounter notes, upcoming appointments, etc.  Non-urgent messages can be sent to your provider as well.   To learn more about what you can do with MyChart, go to [email or web address].    Your next appointment:   6 month(s)  Provider:   Dr Royann Shivers     Signed, Thurmon Fair, MD

## 2023-11-01 NOTE — Patient Instructions (Signed)
Medication Instructions:  Decrease Carvedilol to 6.25 mg twice a day Increase Atorvastatin to 40 mg daily *If you need a refill on your cardiac medications before your next appointment, please call your pharmacy*   Lab Work: Fasting Lipid panel- Please return for Blood Work in 3 months. No appointment needed, lab here at the office is open Monday-Friday from 8AM to 4PM and closed daily for lunch from 12:45-1:45.   If you have labs (blood work) drawn today and your tests are completely normal, you will receive your results only by: MyChart Message (if you have MyChart) OR A paper copy in the mail If you have any lab test that is abnormal or we need to change your treatment, we will call you to review the results.   Follow-Up: At [facility], you and your health needs are our priority.  As part of our continuing mission to provide you with exceptional heart care, we have created designated Provider Care Teams.  These Care Teams include your primary Cardiologist (physician) and Advanced Practice Providers (APPs -  Physician Assistants and Nurse Practitioners) who all work together to provide you with the care you need, when you need it.  We recommend signing up for the patient portal called "MyChart".  Sign up information is provided on this After Visit Summary.  MyChart is used to connect with patients for Virtual Visits (Telemedicine).  Patients are able to view lab/test results, encounter notes, upcoming appointments, etc.  Non-urgent messages can be sent to your provider as well.   To learn more about what you can do with MyChart, go to [email or web address].    Your next appointment:   6 month(s)  Provider:   Dr Royann Shivers

## 2023-11-03 ENCOUNTER — Encounter: Payer: Self-pay | Admitting: Cardiovascular Disease

## 2023-11-08 ENCOUNTER — Encounter: Payer: Self-pay | Admitting: Cardiovascular Disease

## 2024-01-16 ENCOUNTER — Other Ambulatory Visit (INDEPENDENT_AMBULATORY_CARE_PROVIDER_SITE_OTHER): Payer: Self-pay

## 2024-01-16 ENCOUNTER — Ambulatory Visit: Payer: 59 | Admitting: Physician Assistant

## 2024-01-16 DIAGNOSIS — M25552 Pain in left hip: Secondary | ICD-10-CM

## 2024-01-16 DIAGNOSIS — M545 Low back pain, unspecified: Secondary | ICD-10-CM | POA: Diagnosis not present

## 2024-01-16 MED ORDER — METHYLPREDNISOLONE 4 MG PO TBPK: ORAL_TABLET | ORAL | 0 refills | Status: DC

## 2024-01-16 NOTE — Progress Notes (Signed)
 Office Visit Note   Patient: John Delacruz           Date of Birth: 09/16/1961           MRN: [id number] Visit Date: 01/16/2024              Requested by: Mila Palmer, MD [address] [place],  [place] [address] PCP: Mila Palmer, MD   Assessment & Plan: Visit Diagnoses:  1. Pain of left hip   2. Acute midline low back pain without sciatica     Plan: Pleasant 63 year old gentleman with known history of low back degenerative joint disease also comes in today with a history of low back pain in the left posterior buttock as well as some groin pain.  He does try to stay active and he regularly rides a recumbent bike.  He thinks he started up his back when he was working under his house.  It actually is feeling a little bit better today.  He cannot take anti-inflammatories because he has stage III kidney disease.  He does very rarely take a Medrol Dosepak will give him that today no this cannot be a regular medication.  Will follow-up with me as needed should return if symptoms increase  Follow-Up Instructions: Low back pain  Orders:  Orders Placed This Encounter  Procedures   XR HIP UNILAT W OR W/O PELVIS 2-3 VIEWS LEFT   XR Lumbar Spine 2-3 Views   Meds ordered this encounter  Medications   methylPREDNISolone (MEDROL DOSEPAK) 4 MG TBPK tablet    Sig: Take as directed with food    Dispense:  21 tablet    Refill:  0      Procedures: No procedures performed   Clinical Data: No additional findings.   Subjective: Chief Complaint  Patient presents with   Left Hip - Pain    HPI pleasant 63 year old gentleman with a known history of low back degenerative disc disease as well as bilateral hip arthritis.  He is controlled this occasionally with a Medrol Dosepak.  Has not had one since early December.  Says he was working under his house the other day and it seemed to aggravate his back and his hip.  Denies any loss of bowel or bladder control or any  weakness  Review of Systems  All other systems reviewed and are negative.    Objective: Vital Signs: There were no vitals taken for this visit.  Physical Exam Constitutional:      Appearance: Normal appearance.  Skin:    General: Skin is warm and dry.  Neurological:     General: No focal deficit present.     Mental Status: He is alert and oriented to person, place, and time.  Psychiatric:        Mood and Affect: Mood normal.        Behavior: Behavior normal.     Ortho Exam Examination of his low back no radicular findings he has good strength with dorsiflexion plantarflexion extension flexion of his legs a little tight and little pain reproduced with external rotation and internal rotation of his left hip.  No step-offs noted over his low back no redness no erythema Specialty Comments:  No specialty comments available.  Imaging: XR Lumbar Spine 2-3 Views Result Date: 01/16/2024 He is a lumbar spine multilevel degenerative changes  XR HIP UNILAT W OR W/O PELVIS 2-3 VIEWS LEFT Result Date: 01/16/2024 AP pelvis left hip demonstrate moderate sclerotic changes degenerative changes of the  left hip and right hip.  No acute findings    PMFS History: Patient Active Problem List   Diagnosis Date Noted   Pain in left hip 01/16/2024   S/P CABG x 4 03/28/2022   CAD in native artery 03/17/2022   Angina pectoris (HCC)    Nonspecific abnormal electrocardiogram (ECG) (EKG) 02/28/2022   Overweight (BMI 25.0-29.9) 02/28/2022   Precordial pain 02/28/2022   Diverticulitis 12/18/2013   HTN (hypertension) 01/01/2013   Past Medical History:  Diagnosis Date   Anginal pain (HCC) 02/2022   Coronary artery disease    Hypertension     Family History  Problem Relation Age of Onset   Hypertension Mother    Kidney disease Mother    Lupus Sister     Past Surgical History:  Procedure Laterality Date   CARDIAC CATHETERIZATION     CORONARY ARTERY BYPASS GRAFT N/A 03/28/2022    Procedure: CORONARY ARTERY BYPASS GRAFTING X4, USING LEFT INTERNAL MAMMARY ARTERY, LEFT AND RIGHT GREATER SAPHENOUS VEIN HARVESTED ENDOSCOPICALLY;  Surgeon: Alleen Borne, MD;  Location: [facility] OR;  Service: Open Heart Surgery;  Laterality: N/A;   FRACTURE SURGERY     HERNIA REPAIR     LEFT HEART CATH AND CORONARY ANGIOGRAPHY N/A 03/17/2022   Procedure: LEFT HEART CATH AND CORONARY ANGIOGRAPHY;  Surgeon: Lyn Records, MD;  Location: [facility] INVASIVE CV LAB;  Service: Cardiovascular;  Laterality: N/A;   TEE WITHOUT CARDIOVERSION N/A 03/28/2022   Procedure: TRANSESOPHAGEAL ECHOCARDIOGRAM (TEE);  Surgeon: Alleen Borne, MD;  [facility];  Service: Open Heart Surgery;  Laterality: N/A;   Social History   Occupational History   Not on file  Tobacco Use   Smoking status: Former    Current packs/day: 0.00    Average packs/day: 1 pack/day for 35.0 years (35.0 ttl pk-yrs)    Types: Cigarettes    Start date: 03/28/1987    Quit date: 03/27/2022    Years since quitting: 1.8   Smokeless tobacco: Never   Tobacco comments:    Quit on 03/27/22 and has not resumed smoking  Vaping Use   Vaping status: Never Used  Substance and Sexual Activity   Alcohol use: Not Currently    Alcohol/week: 3.0 - 4.0 standard drinks of alcohol    Types: 3 - 4 Cans of beer per week    Comment: not drinking currently   Drug use: No   Sexual activity: Yes

## 2024-01-17 ENCOUNTER — Ambulatory Visit: Payer: 59 | Admitting: Physician Assistant

## 2024-01-26 ENCOUNTER — Telehealth: Payer: Self-pay | Admitting: Physician Assistant

## 2024-01-26 NOTE — Telephone Encounter (Signed)
 Patient states the medication (Medrol Dosepak 4mg ) is not working and would like something different

## 2024-01-29 ENCOUNTER — Ambulatory Visit: Admitting: Physician Assistant

## 2024-01-29 DIAGNOSIS — Z7982 Long term (current) use of aspirin: Secondary | ICD-10-CM | POA: Diagnosis not present

## 2024-01-29 DIAGNOSIS — I2510 Atherosclerotic heart disease of native coronary artery without angina pectoris: Secondary | ICD-10-CM | POA: Diagnosis not present

## 2024-01-29 DIAGNOSIS — M25552 Pain in left hip: Secondary | ICD-10-CM

## 2024-01-29 DIAGNOSIS — M1990 Unspecified osteoarthritis, unspecified site: Secondary | ICD-10-CM | POA: Diagnosis not present

## 2024-01-29 DIAGNOSIS — E785 Hyperlipidemia, unspecified: Secondary | ICD-10-CM | POA: Diagnosis not present

## 2024-01-29 DIAGNOSIS — N400 Enlarged prostate without lower urinary tract symptoms: Secondary | ICD-10-CM | POA: Diagnosis not present

## 2024-01-29 DIAGNOSIS — I10 Essential (primary) hypertension: Secondary | ICD-10-CM | POA: Diagnosis not present

## 2024-01-29 DIAGNOSIS — Z8249 Family history of ischemic heart disease and other diseases of the circulatory system: Secondary | ICD-10-CM | POA: Diagnosis not present

## 2024-01-29 DIAGNOSIS — Z888 Allergy status to other drugs, medicaments and biological substances status: Secondary | ICD-10-CM | POA: Diagnosis not present

## 2024-01-29 DIAGNOSIS — R609 Edema, unspecified: Secondary | ICD-10-CM | POA: Diagnosis not present

## 2024-01-29 NOTE — Telephone Encounter (Signed)
 done

## 2024-01-30 ENCOUNTER — Encounter: Payer: Self-pay | Admitting: Physician Assistant

## 2024-01-30 DIAGNOSIS — M25552 Pain in left hip: Secondary | ICD-10-CM

## 2024-01-30 MED ORDER — METHYLPREDNISOLONE ACETATE 40 MG/ML IJ SUSP
40.0000 mg | INTRAMUSCULAR | Status: AC | PRN
Start: 2024-01-30 — End: 2024-01-30
  Administered 2024-01-30: 40 mg via INTRA_ARTICULAR

## 2024-01-30 MED ORDER — LIDOCAINE HCL 1 % IJ SOLN
5.0000 mL | INTRAMUSCULAR | Status: AC | PRN
Start: 2024-01-30 — End: 2024-01-30
  Administered 2024-01-30: 5 mL

## 2024-01-30 NOTE — Progress Notes (Signed)
 Office Visit Note   Patient: John Delacruz           Date of Birth: 02/04/1961           MRN: [id number] Visit Date: 01/29/2024              Requested by: Mila Palmer, MD [address] [place],  [place] [address] PCP: Mila Palmer, MD  No chief complaint on file.     HPI: Patient is a pleasant 63 year old gentleman who I have treated for back and hip issues.  He called me earlier today saying he did not get long-lasting relief from the steroid.  Complains of most of his pain and focally over the lateral side of his hip  Assessment & Plan: Visit Diagnoses: Trochanteric bursitis  Plan: Exam today is very focal over the greater trochanteric bursa.  Will try an injection.  He does not have any radicular findings today and no pain in his groin or with manipulation of his hip he will let me know how he does  Follow-Up Instructions: No follow-ups on file.   Ortho Exam  Patient is alert, oriented, no adenopathy, well-dressed, normal affect, normal respiratory effort. Right hip no pain with manipulation of the hip no radicular findings in his back his strength is intact he does point to some tenderness where he gets over the trochanteric bursa no tenderness along the IT band  Imaging: No results found. No images are attached to the encounter.  Labs: Lab Results  Component Value Date   HGBA1C 5.9 (H) 03/24/2022   REPTSTATUS 06/20/2023 FINAL 06/17/2023   GRAMSTAIN  07/13/2016    ABUNDANT WBC PRESENT, PREDOMINANTLY PMN ABUNDANT GRAM POSITIVE COCCI IN PAIRS IN CLUSTERS RARE GRAM POSITIVE RODS    CULT  06/17/2023    NO GROUP A STREP (S.PYOGENES) ISOLATED Performed at [facility], [address], [place], [place] [address]      Lab Results  Component Value Date   ALBUMIN 4.2 09/29/2022   ALBUMIN 4.3 05/26/2022   ALBUMIN 4.1 03/24/2022    Lab Results  Component Value Date   MG 1.9 12/01/2022   MG 2.4 03/29/2022   MG 2.2 03/29/2022   No  results found for: "VD25OH"  No results found for: "PREALBUMIN"    Latest Ref Rng & Units 09/29/2022    9:33 AM 04/19/2022    9:19 AM 03/31/2022   12:57 AM  CBC EXTENDED  WBC 3.4 - 10.8 x10E3/uL 7.1  6.3  10.4   RBC 4.14 - 5.80 x10E6/uL 4.83  3.81  3.14   Hemoglobin 13.0 - 17.7 g/dL 82.9  56.2  9.1   HCT 13.0 - 51.0 % 38.6  31.7  27.5   Platelets 150 - 450 x10E3/uL 291  392  185      There is no height or weight on file to calculate BMI.  Orders:  No orders of the defined types were placed in this encounter.  No orders of the defined types were placed in this encounter.    Procedures: Large Joint Inj: L greater trochanter on 01/30/2024 8:06 AM Indications: pain and diagnostic evaluation Details: 25 G 1.5 in needle, lateral approach  Arthrogram: No  Medications: 5 mL lidocaine 1 %; 40 mg methylPREDNISolone acetate 40 MG/ML Outcome: tolerated well, no immediate complications Procedure, treatment alternatives, risks and benefits explained, specific risks discussed. Consent was given by the patient.     Clinical Data: No additional findings.  ROS:  All  other systems negative, except as noted in the HPI. Review of Systems  Objective: Vital Signs: There were no vitals taken for this visit.  Specialty Comments:  No specialty comments available.  PMFS History: Patient Active Problem List   Diagnosis Date Noted  . Pain in left hip 01/16/2024  . S/P CABG x 4 03/28/2022  . CAD in native artery 03/17/2022  . Angina pectoris (HCC)   . Nonspecific abnormal electrocardiogram (ECG) (EKG) 02/28/2022  . Overweight (BMI 25.0-29.9) 02/28/2022  . Precordial pain 02/28/2022  . Diverticulitis 12/18/2013  . HTN (hypertension) 01/01/2013   Past Medical History:  Diagnosis Date  . Anginal pain (HCC) 02/2022  . Coronary artery disease   . Hypertension     Family History  Problem Relation Age of Onset  . Hypertension Mother   . Kidney disease Mother   . Lupus Sister      Past Surgical History:  Procedure Laterality Date  . CARDIAC CATHETERIZATION    . CORONARY ARTERY BYPASS GRAFT N/A 03/28/2022   Procedure: CORONARY ARTERY BYPASS GRAFTING X4, USING LEFT INTERNAL MAMMARY ARTERY, LEFT AND RIGHT GREATER SAPHENOUS VEIN HARVESTED ENDOSCOPICALLY;  Surgeon: Alleen Borne, MD;  Location: [facility] OR;  Service: Open Heart Surgery;  Laterality: N/A;  . FRACTURE SURGERY    . HERNIA REPAIR    . LEFT HEART CATH AND CORONARY ANGIOGRAPHY N/A 03/17/2022   Procedure: LEFT HEART CATH AND CORONARY ANGIOGRAPHY;  Surgeon: Lyn Records, MD;  Location: [facility] INVASIVE CV LAB;  Service: Cardiovascular;  Laterality: N/A;  . TEE WITHOUT CARDIOVERSION N/A 03/28/2022   Procedure: TRANSESOPHAGEAL ECHOCARDIOGRAM (TEE);  Surgeon: Alleen Borne, MD;  Location: [facility];  Service: Open Heart Surgery;  Laterality: N/A;   Social History   Occupational History  . Not on file  Tobacco Use  . Smoking status: Former    Current packs/day: 0.00    Average packs/day: 1 pack/day for 35.0 years (35.0 ttl pk-yrs)    Types: Cigarettes    Start date: 03/28/1987    Quit date: 03/27/2022    Years since quitting: 1.8  . Smokeless tobacco: Never  . Tobacco comments:    Quit on 03/27/22 and has not resumed smoking  Vaping Use  . Vaping status: Never Used  Substance and Sexual Activity  . Alcohol use: Not Currently    Alcohol/week: 3.0 - 4.0 standard drinks of alcohol    Types: 3 - 4 Cans of beer per week    Comment: not drinking currently  . Drug use: No  . Sexual activity: Yes

## 2024-02-23 ENCOUNTER — Encounter: Payer: Self-pay | Admitting: Emergency Medicine

## 2024-04-26 ENCOUNTER — Encounter: Payer: Self-pay | Admitting: Cardiovascular Disease

## 2024-04-26 DIAGNOSIS — E785 Hyperlipidemia, unspecified: Secondary | ICD-10-CM

## 2024-05-01 ENCOUNTER — Telehealth: Payer: Self-pay | Admitting: Cardiovascular Disease

## 2024-05-01 NOTE — Telephone Encounter (Signed)
 Pt asking about instructions before having his blood work done. Please advise

## 2024-05-01 NOTE — Telephone Encounter (Signed)
 Left message for patient to call back

## 2024-05-02 DIAGNOSIS — E785 Hyperlipidemia, unspecified: Secondary | ICD-10-CM | POA: Diagnosis not present

## 2024-05-02 DIAGNOSIS — I2510 Atherosclerotic heart disease of native coronary artery without angina pectoris: Secondary | ICD-10-CM | POA: Diagnosis not present

## 2024-05-02 NOTE — Telephone Encounter (Signed)
Spoke with pt, all questions answered.

## 2024-05-02 NOTE — Telephone Encounter (Signed)
 Left message for pt to call.

## 2024-05-03 ENCOUNTER — Ambulatory Visit: Payer: Self-pay | Admitting: Cardiology

## 2024-05-05 NOTE — Progress Notes (Signed)
 " Cardiology Office Note:   Date:  05/06/2024  ID:  John Delacruz, DOB [birth year], MRN [id number] PCP: Verena Mems, MD  [facility] Providers Cardiologist:  Jerel Balding, MD    History of Present Illness:     At last visit, Atorvastatin  increased to 40mg  due to LDL>70. Updated panel on 6/12 shows LDL 68.   Discussed the use of AI scribe software for clinical note transcription with the patient, who gave verbal consent to proceed.  History of Present Illness John Delacruz is a 63 y.o. male with history of CAD (CABG x 4, LIMA-LAD, SVG-OM1, SVG-OM2, SVG-distal RCA, Bartle 03/28/2022), dyslipidemia, HTN, preserved left ventricular systolic function. Patient presents with chest discomfort during physical activity.  He experiences discomfort in the middle of his chest, described as feeling like indigestion/pressure. This began approximately eight days ago and occurs during physical activities such as working out or riding a bike. The discomfort lasts for about twenty minutes into his workout and then eases even when exertion is continued. No shortness of breath with this discomfort/with physical activity. It does not occur at night or wake him from sleep. He rates the discomfort as mild, around four to five out of ten, and it subsides without needing to rest.  No associated shortness of breath, dizziness, lightheadedness, sweating, or cold sweats during these episodes. He has not experienced similar chest pain prior to his bypass surgery, only shortness of breath. He has been drinking more coffee in the evenings.  He is currently taking carvedilol  once daily, although it was prescribed to be taken twice daily. He has not been taking nitroglycerin , says he was told previously he did not need it. He continues to take 81 mg of aspirin  daily. His blood pressure at home is typically around 140/68, but it was higher during the visit, possibly due to stress about his symptoms.  His cholesterol  medication was recently increased, and his LDL has improved from 77 to 68. He is also concerned about his A1c, but it was not checked in the recent blood work.  Studies Reviewed:    EKG:   EKG Interpretation Date/Time:  Monday May 06 2024 10:35:07 EDT Ventricular Rate:  52 PR Interval:  180 QRS Duration:  82 QT Interval:  444 QTC Calculation: 412 R Axis:   -25  Text Interpretation: Sinus bradycardia Septal infarct (cited on or before 10-Apr-2022) When compared with ECG of 01-Nov-2023 08:20, No significant change was found Confirmed by [name] [id number]) on 05/06/2024 10:49:07 AM      Risk Assessment/Calculations:     HYPERTENSION CONTROL Vitals:   05/06/24 1026 05/06/24 1126  BP: (!) 150/78 (!) 160/80    The patient's blood pressure is elevated above target today.  In order to address the patient's elevated BP: Blood pressure will be monitored at home to determine if medication changes need to be made.           Physical Exam:   VS:  BP (!) 160/80   Pulse (!) 56   Ht 5' 9.5 (1.765 m)   Wt 200 lb 12.8 oz (91.1 kg)   BMI 29.23 kg/m    Wt Readings from Last 3 Encounters:  05/06/24 200 lb 12.8 oz (91.1 kg)  11/01/23 209 lb (94.8 kg)  03/31/23 204 lb (92.5 kg)     Physical Exam Vitals reviewed.  Constitutional:      Appearance: Normal appearance.  HENT:     Head: Normocephalic.   Eyes:  Pupils: Pupils are equal, round, and reactive to light.    Cardiovascular:     Rate and Rhythm: Normal rate and regular rhythm.     Pulses: Normal pulses.     Heart sounds: Normal heart sounds.  Pulmonary:     Effort: Pulmonary effort is normal.     Breath sounds: Normal breath sounds.  Abdominal:     General: Abdomen is flat.     Palpations: Abdomen is soft.   Musculoskeletal:     Right lower leg: No edema.     Left lower leg: No edema.   Skin:    General: Skin is warm and dry.     Capillary Refill: Capillary refill takes less than 2 seconds.    Neurological:     General: No focal deficit present.     Mental Status: He is alert and oriented to person, place, and time.   Psychiatric:        Mood and Affect: Mood normal.        Behavior: Behavior normal.        Thought Content: Thought content normal.        Judgment: Judgment normal.      ASSESSMENT AND PLAN:    Assessment & Plan CAD with coronary artery bypass graft Chest discomfort CABG x 4, LIMA-LAD, SVG-OM1, SVG-OM2, SVG-distal RCA, Bartle 03/28/2022. Intermittent exertional chest discomfort, mild (4-5/10), centrally located. Symptoms present for ~10 days. Differential includes GERD and cardiac ischemia. No acute ischemia on ECG today, but further evaluation warranted due to exertional nature and coronary artery disease history. Exercise treadmill stress test recommended for cardiac function and blood flow evaluation. Protonix  prescribed for reflux-related symptoms. Nitroglycerin  advised for emergency use due to potential cardiac-related discomfort. - Order treadmill stress test (given exertional symptoms) to evaluate myocardial perfusion - Prescribe Protonix  (pantoprazole ) once daily for one month for empiric treatment of reflux-related symptoms. - Provide nitroglycerin  as needed for potential cardiac-related discomfort. - Continue ASA 81mg  daily - Ensure Coreg  is taken twice daily - Instruct to monitor symptoms and seek immediate medical attention if symptoms worsen.   Hypertension Blood pressure averages in the 140s, higher than recommended. Current regimen includes carvedilol , but inconsistent adherence. Blood pressure may contribute to exertional chest discomfort. Emphasis on consistent medication adherence before considering additional antihypertensive therapy. - Instruct to take carvedilol  6.25mg  twice daily as prescribed (has been taking once a day recently). - Continue hydrochlorothiazide  12.5mg  daily - Monitor blood pressure at home and report readings next  week. - Consider starting amlodipine  if blood pressure remains elevated after consistent carvedilol  use.  Hyperlipidemia LDL cholesterol improved to 68 mg/dL after increasing atorvastatin . Current level within guidelines but may benefit from reduction to <55 mg/dL with CABG history. Further statin increase deferred by patient at this time.  Follow-up Follow-up plan discussed for timely evaluation and management of symptoms. Concern about stress test timing addressed, reassured about timeline appropriateness given symptoms. - Call next week to check on blood pressure readings and carvedilol  adherence. - Advise to contact healthcare provider if symptoms worsen or concerns about treatment plan arise.             Signed, Artist Pouch, PA-C   "

## 2024-05-06 ENCOUNTER — Ambulatory Visit: Attending: Cardiology | Admitting: Cardiology

## 2024-05-06 VITALS — BP 160/80 | HR 56 | Ht 69.5 in | Wt 200.8 lb

## 2024-05-06 DIAGNOSIS — I10 Essential (primary) hypertension: Secondary | ICD-10-CM

## 2024-05-06 DIAGNOSIS — E785 Hyperlipidemia, unspecified: Secondary | ICD-10-CM

## 2024-05-06 DIAGNOSIS — R072 Precordial pain: Secondary | ICD-10-CM | POA: Diagnosis not present

## 2024-05-06 DIAGNOSIS — I2510 Atherosclerotic heart disease of native coronary artery without angina pectoris: Secondary | ICD-10-CM | POA: Diagnosis not present

## 2024-05-06 MED ORDER — NITROGLYCERIN 0.4 MG SL SUBL: 0.4000 mg | SUBLINGUAL_TABLET | SUBLINGUAL | 0 refills | Status: AC | PRN

## 2024-05-06 MED ORDER — PANTOPRAZOLE SODIUM 20 MG PO TBEC: 20.0000 mg | DELAYED_RELEASE_TABLET | Freq: Every day | ORAL | 0 refills | Status: AC

## 2024-05-06 NOTE — Patient Instructions (Signed)
 Medication Instructions:  START Protonix  20 mg once daily   AS NEEDED Nitroglycerin  0.4 mg SL The proper use and anticipated side effects of nitroglycerine has been carefully explained.  If a single episode of chest pain is not relieved by one tablet, the patient will try another within 5 minutes; and if this doesn't relieve the pain, the patient is instructed to call 911 for transportation to an emergency department.  **REMINDER: Make sure to take your Carvedilol  (Coreg ) 6.25 mg twice daily**  *If you need a refill on your cardiac medications before your next appointment, please call your pharmacy*  Lab Work: None ordered today. If you have labs (blood work) drawn today and your tests are completely normal, you will receive your results only by: MyChart Message (if you have MyChart) OR A paper copy in the mail If you have any lab test that is abnormal or we need to change your treatment, we will call you to review the results.  Testing/Procedures: Your physician has requested that you have en exercise stress myoview. For further information please visit [email or web address] Please follow instruction sheet, as given.  Follow-Up: At [facility], you and your health needs are our priority.  As part of our continuing mission to provide you with exceptional heart care, our providers are all part of one team.  This team includes your primary Cardiologist (physician) and Advanced Practice Providers or APPs (Physician Assistants and Nurse Practitioners) who all work together to provide you with the care you need, when you need it.  Your next appointment:   1 month(s)  Provider:   Leala Prince, PA-C   We recommend signing up for the patient portal called MyChart.  Sign up information is provided on this After Visit Summary.  MyChart is used to connect with patients for Virtual Visits (Telemedicine).  Patients are able to view lab/test results, encounter notes, upcoming appointments,  etc.  Non-urgent messages can be sent to your provider as well.   To learn more about what you can do with MyChart, go to [email or web address].   Other Instructions Exercise Myoview (Stress Test) Instructions  Please arrive 15 minutes prior to your appointment time for registration and insurance purposes.   The test will take approximately 3 to 4 hours to complete; you may bring reading material.  If someone comes with you to your appointment, they will need to remain in the main lobby due to limited space in the testing area. **If you are pregnant or breastfeeding, please notify the nuclear lab prior to your appointment**   How to prepare for your Myocardial Perfusion Test: Do not eat or drink 3 hours prior to your test, except you may have water. Do not consume products containing caffeine (regular or decaffeinated) 12 hours prior to your test. (ex: coffee, chocolate, sodas, tea). Do bring a list of your current medications with you.  If not listed below, you may take your medications as normal. HOLD Carvedilol  (Coreg ) morning dose the morning of your stress test. Resume with the evening dose that day. Do wear comfortable clothes (no dresses or overalls) and walking shoes, tennis shoes preferred (No heels or open toe shoes are allowed). Do NOT wear cologne, perfume, aftershave, or lotions (deodorant is allowed). If these instructions are not followed, your test will have to be rescheduled.   Please report to [address], [place], [place] [address] for your test.  If you have questions or concerns about your appointment, you can call the Nuclear Lab at [phone].  If you cannot keep your appointment, please provide 24 hours notification to the Nuclear Lab, to avoid a possible $50 charge to your account.

## 2024-05-09 ENCOUNTER — Telehealth (HOSPITAL_COMMUNITY): Payer: Self-pay | Admitting: *Deleted

## 2024-05-09 ENCOUNTER — Telehealth (HOSPITAL_COMMUNITY): Payer: Self-pay | Admitting: Radiology

## 2024-05-09 NOTE — Telephone Encounter (Signed)
 Patient given detailed instructions per Myocardial Perfusion Study Information Sheet for the test on 10:30 at [facility]. Patient notified to arrive 15 minutes early and that it is imperative to arrive on time for appointment to keep from having the test rescheduled.  If you need to cancel or reschedule your appointment, please call the office within 24 hours of your appointment. . Patient verbalized understanding.EHK 26/26/25

## 2024-05-09 NOTE — Telephone Encounter (Signed)
 Left message on voicemail in reference to upcoming appointment scheduled for  05/16/24 Phone number given for a call back so details instructions can be given. Argentina Bees, RN

## 2024-05-13 ENCOUNTER — Other Ambulatory Visit: Payer: Self-pay | Admitting: Cardiology

## 2024-05-13 ENCOUNTER — Telehealth: Payer: Self-pay | Admitting: *Deleted

## 2024-05-13 DIAGNOSIS — R072 Precordial pain: Secondary | ICD-10-CM

## 2024-05-13 DIAGNOSIS — E785 Hyperlipidemia, unspecified: Secondary | ICD-10-CM

## 2024-05-13 DIAGNOSIS — I10 Essential (primary) hypertension: Secondary | ICD-10-CM

## 2024-05-13 DIAGNOSIS — I2510 Atherosclerotic heart disease of native coronary artery without angina pectoris: Secondary | ICD-10-CM

## 2024-05-13 DIAGNOSIS — Z79899 Other long term (current) drug therapy: Secondary | ICD-10-CM

## 2024-05-13 DIAGNOSIS — Z951 Presence of aortocoronary bypass graft: Secondary | ICD-10-CM

## 2024-05-13 MED ORDER — ASPIRIN 81 MG PO TBEC
81.0000 mg | DELAYED_RELEASE_TABLET | Freq: Every day | ORAL | Status: AC
Start: 2024-05-13 — End: ?

## 2024-05-13 NOTE — Telephone Encounter (Signed)
 [name], PA-C  [name], PA-C; Gladis Porter HERO, LPN Please call patient to inquire about blood pressure readings.   Returned a call back to the pt to inquire about his blood pressure recordings since last being seen in the office, and as advise by Birmingham Trudy PA-C.   Pt states he has been tracking his pressures but he does not have them on hand at this time, for he is at work.  Pt asked that I call him back at 4:30 pm today, and he will be home and available to endorse those readings to me at that time.   Pt aware I will give him a call back at 4:30 pm, as requested.   Pt verbalized understanding and agrees with this plan.

## 2024-05-13 NOTE — Telephone Encounter (Signed)
 Returned a call back to the pt to obtain BP/HR readings over the last week, as requested by Artist Pouch PA-C.   BP/HR readings are as indicated below:  Tue 6/17--BP-133/70 HR-53  Wed 6/18--BP-126/68 HR-56  Thurs 6/19--BP-121/64 HR-56  Fri 6/20--BP-118/61 HR-57  Sat 6/21--Did not record BP/HR readings this day  Sun 6/22--BP-119/56 HR-53  Mon 6/23--BP-125/66 HR-59   Pt is aware I will route these readings to Dayna Dunn PA-C (covering for Artist while he is out of office) for further review and advisement.  Pt is aware we will call him accordingly thereafter, with any further changes or recommendations.   Pt verbalized understanding and agrees with this plan.

## 2024-05-13 NOTE — Addendum Note (Signed)
 Addended by: GLADIS PORTER HERO on: 05/13/2024 05:53 PM   Modules accepted: Orders

## 2024-05-13 NOTE — Telephone Encounter (Signed)
 Covering for [employer] PA-C this week. Blood pressures appear satisfactory at this time, no change in antihypertensive regimen needed acutely. Recommendations:  - Continue plan as outlined by Artist in note - has nuclear stress test this week.  - Continue to monitor blood pressure episodically and notify if SBP begins running 130 or higher on several readings - It does look like his labs in our system are out of date; can we verify if he has had a BMET and CBC this year? If not, would please obtain at his convenience since Dr. Sheena is prescriber of hydrochlorothiazide . - OV mentions to continue ASA 81mg  daily given h/o CABG but I do not see on med list. Please verify he is taking and add back if so.  Thank you!

## 2024-05-13 NOTE — Telephone Encounter (Signed)
 Returned a call back to the pt.  Pt states last time he had his renal function and blood counts checked was probably a year ago.  He agreed to have this rechecked when he comes in this Thursday 6/26 for his stress test.  Order for BMET and CBC placed in the system and released accordingly.  He did ask if the Provider would be okay adding on a Hbg A1C to those labs, since we are rechecking everything.  Will ask Dayna Dunn PA-C (covering for [employer]) if she is okay with us  adding on the A1C to the other labs.    Pt did confirm with me that he is taking ASA 81 mg po daily for the last 2 years.  Will add this back to his medication list.   Pt aware to continue monitoring his BP/HR at home and notify us  if he notices his SBP consistently running 130 or higher.  Pt verbalized understanding and agrees with this plan.

## 2024-05-15 NOTE — Addendum Note (Signed)
 Addended by: GLADIS PORTER HERO on: 05/15/2024 07:23 AM   Modules accepted: Orders

## 2024-05-15 NOTE — Telephone Encounter (Signed)
 Verdon, Ferrante - 05/13/2024  2:30 PM Abigail Raphael SAILOR, PA-C  Sent: Mon May 13, 2024  6:37 PM  To: Gladis Porter HERO, LPN         Message  OK to add when you return. Can put under diagnosis of CAD. Thank you!    A1C lab order placed and added on for pt to have done on 6/26 with his CBC and BMP.  All orders placed and released in the system.

## 2024-05-16 ENCOUNTER — Ambulatory Visit (HOSPITAL_COMMUNITY)
Admission: RE | Admit: 2024-05-16 | Discharge: 2024-05-16 | Disposition: A | Discharge status: Home / Self Care | Source: Ambulatory Visit | Attending: Cardiovascular Disease | Admitting: Cardiovascular Disease

## 2024-05-16 DIAGNOSIS — R072 Precordial pain: Secondary | ICD-10-CM | POA: Diagnosis not present

## 2024-05-16 LAB — MYOCARDIAL PERFUSION IMAGING
Base ST Depression (mm): 0 mm
LV dias vol: 99 mL (ref 62–150)
LV sys vol: 31 mL (ref 4.2–5.8)
Nuc Stress EF: 69 %
Peak HR: 77 {beats}/min
Rest HR: 55 {beats}/min
Rest Nuclear Isotope Dose: 10.7 mCi
SDS: 0
SRS: 1
SSS: 0
ST Elevation (mm): 2 mm
Stress Nuclear Isotope Dose: 30.4 mCi
TID: 0.9

## 2024-05-16 MED ORDER — TECHNETIUM TC 99M TETROFOSMIN IV KIT
30.4000 | PACK | Freq: Once | INTRAVENOUS | Status: AC | PRN
  Administered 2024-05-16: 30.4 via INTRAVENOUS

## 2024-05-16 MED ORDER — REGADENOSON 0.4 MG/5ML IV SOLN
INTRAVENOUS | Status: AC
  Filled 2024-05-16: qty 5

## 2024-05-16 MED ORDER — TECHNETIUM TC 99M TETROFOSMIN IV KIT
10.7000 | PACK | Freq: Once | INTRAVENOUS | Status: AC | PRN
  Administered 2024-05-16: 10.7 via INTRAVENOUS

## 2024-05-16 MED ORDER — REGADENOSON 0.4 MG/5ML IV SOLN
0.4000 mg | Freq: Once | INTRAVENOUS | Status: AC
  Administered 2024-05-16: 0.4 mg via INTRAVENOUS

## 2024-05-17 ENCOUNTER — Ambulatory Visit: Payer: Self-pay | Admitting: Physician Assistant

## 2024-05-20 ENCOUNTER — Ambulatory Visit: Payer: Self-pay | Admitting: Cardiology

## 2024-05-21 ENCOUNTER — Telehealth: Payer: Self-pay | Admitting: Cardiovascular Disease

## 2024-05-21 NOTE — Telephone Encounter (Signed)
 Reviewed results w patient. He changed his diet some and is taking pantoprazole .  Still having GERD symptoms but it is much better.   He is aware I will let Artist know and if anything further is needed we will call him back.

## 2024-05-21 NOTE — Telephone Encounter (Signed)
Patient returning call in regards to stress test results.  

## 2024-06-11 ENCOUNTER — Ambulatory Visit
Admission: EM | Admit: 2024-06-11 | Discharge: 2024-06-11 | Disposition: A | Discharge status: Home / Self Care | Attending: Emergency Medicine | Admitting: Emergency Medicine

## 2024-06-11 DIAGNOSIS — L0291 Cutaneous abscess, unspecified: Secondary | ICD-10-CM

## 2024-06-11 MED ORDER — DOXYCYCLINE HYCLATE 100 MG PO CAPS: 100.0000 mg | ORAL_CAPSULE | Freq: Two times a day (BID) | ORAL | 0 refills | Status: AC

## 2024-06-11 NOTE — Discharge Instructions (Addendum)
 Doxycycline  twice a day for 7 days in a row. Take with food to avoid upset stomach. Finish the full course! Make sure to wear sunscreen or cover the skin with clothing, as this medicine can make you more sensitive to the sun.

## 2024-06-11 NOTE — ED Provider Notes (Signed)
 [name]    CSN: [id number] Arrival date & time: 06/11/24  1642     History   Chief Complaint Chief Complaint  Patient presents with   Abscess    HPI John Delacruz is a 63 y.o. male.  Here with about a week of possible abscess on the left outer arm.  Reports it started dime sized but has slowly gotten bigger, and more sore.  He did try to squeeze it and pop it but it did not work.  Denies any drainage. Not sure if he had a scratch or bite in this area Denies fever Has applied rubbing alcohol and neosporin   Reports history of abscess on the back  Past Medical History:  Diagnosis Date   Anginal pain (HCC) 02/2022   Coronary artery disease    Hypertension     Patient Active Problem List   Diagnosis Date Noted   Pain in left hip 01/16/2024   S/P CABG x 4 03/28/2022   CAD in native artery 03/17/2022   Angina pectoris (HCC)    Nonspecific abnormal electrocardiogram (ECG) (EKG) 02/28/2022   Overweight (BMI 25.0-29.9) 02/28/2022   Precordial pain 02/28/2022   Diverticulitis 12/18/2013   HTN (hypertension) 01/01/2013    Past Surgical History:  Procedure Laterality Date   CARDIAC CATHETERIZATION     CORONARY ARTERY BYPASS GRAFT N/A 03/28/2022   Procedure: CORONARY ARTERY BYPASS GRAFTING X4, USING LEFT INTERNAL MAMMARY ARTERY, LEFT AND RIGHT GREATER SAPHENOUS VEIN HARVESTED ENDOSCOPICALLY;  Surgeon: [name], MD;  Location: [facility] OR;  Service: Open Heart Surgery;  Laterality: N/A;   FRACTURE SURGERY     HERNIA REPAIR     LEFT HEART CATH AND CORONARY ANGIOGRAPHY N/A 03/17/2022   Procedure: LEFT HEART CATH AND CORONARY ANGIOGRAPHY;  Surgeon: [name], MD;  Location: [facility] INVASIVE [facility];  Service: Cardiovascular;  Laterality: N/A;   TEE WITHOUT CARDIOVERSION N/A 03/28/2022   Procedure: TRANSESOPHAGEAL ECHOCARDIOGRAM (TEE);  Surgeon: [name], MD;  Location: [facility] OR;  Service: Open Heart Surgery;  Laterality: N/A;     Home Medications     Prior to Admission medications   Medication Sig Start Date End Date Taking? Authorizing Provider  doxycycline  (VIBRAMYCIN ) 100 MG capsule Take 1 capsule (100 mg total) by mouth 2 (two) times daily for 7 days. 06/11/24 06/18/24 Yes Varshini Arrants, Asberry, PA-C  acetaminophen  (TYLENOL ) 500 MG tablet Take 1-2 tablets (500-1,000 mg total) by mouth every 6 (six) hours as needed. 04/01/22   Barrett, Rocky SAUNDERS, PA-C  aspirin  EC 81 MG tablet Take 1 tablet (81 mg total) by mouth daily. Swallow whole. 05/13/24   Dunn, Dayna N, PA-C  atorvastatin  (LIPITOR) 40 MG tablet Take 1 tablet (40 mg total) by mouth daily. 11/01/23   Croitoru, Mihai, MD  bisacodyl  (DULCOLAX) 5 MG EC tablet Take 5 mg by mouth daily as needed for moderate constipation. Patient not taking: Reported on 05/06/2024    [provider]  carvedilol  (COREG ) 6.25 MG tablet Take 1 tablet (6.25 mg total) by mouth 2 (two) times daily. 11/01/23   Croitoru, Mihai, MD  hydrochlorothiazide  (MICROZIDE ) 12.5 MG capsule TAKE 1 CAPSULE BY MOUTH EVERY DAY 07/13/23   Tobb, Kardie, DO  methylPREDNISolone  (MEDROL  DOSEPAK) 4 MG TBPK tablet Take as directed with food Patient not taking: Reported on 05/06/2024 01/16/24   Persons, Ronal Dragon, PA  nitroGLYCERIN  (NITROSTAT ) 0.4 MG SL tablet Place 1 tablet (0.4 mg total) under the tongue every 5 (five) minutes as needed for chest  pain. The proper use and anticipated side effects of nitroglycerine has been carefully explained.  If a single episode of chest pain is not relieved by one tablet, the patient will try another within 5 minutes; and if this doesn't relieve the pain, the patient is instructed to call 911 for transportation to an emergency department. 05/06/24   Williams, Evan, PA-C  oxymetazoline (AFRIN) 0.05 % nasal spray Place 1 spray into both nostrils 2 (two) times daily as needed for congestion.    [provider]  pantoprazole  (PROTONIX ) 20 MG tablet Take 1 tablet (20 mg total) by mouth daily. 05/06/24    Williams, Evan, PA-C  VITAMIN D PO Take 1 capsule by mouth daily.    [provider]    Family History Family History  Problem Relation Age of Onset   Hypertension Mother    Kidney disease Mother    Lupus Sister     Social History Social History   Tobacco Use   Smoking status: Former    Current packs/day: 0.00    Average packs/day: 1 pack/day for 35.0 years (35.0 ttl pk-yrs)    Types: Cigarettes    Start date: 03/28/1987    Quit date: 03/27/2022    Years since quitting: 2.2   Smokeless tobacco: Never   Tobacco comments:    Quit on 03/27/22 and has not resumed smoking  Vaping Use   Vaping status: Never Used  Substance Use Topics   Alcohol use: Not Currently    Alcohol/week: 3.0 - 4.0 standard drinks of alcohol    Types: 3 - 4 Cans of beer per week    Comment: not drinking currently   Drug use: No     Allergies   Lisinopril    Review of Systems Review of Systems As per HPI  Physical Exam Triage Vital Signs ED Triage Vitals  Encounter Vitals Group     BP 06/11/24 1711 121/76     Girls Systolic BP Percentile --      Girls Diastolic BP Percentile --      Boys Systolic BP Percentile --      Boys Diastolic BP Percentile --      Pulse Rate 06/11/24 1711 (!) 56     Resp 06/11/24 1711 18     Temp 06/11/24 1711 98 F (36.7 C)     Temp Source 06/11/24 1711 Oral     SpO2 06/11/24 1711 96 %     Weight --      Height --      Head Circumference --      Peak Flow --      Pain Score 06/11/24 1710 0     Pain Loc --      Pain Education --      Exclude from Growth Chart --    No data found.  Updated Vital Signs BP 121/76 (BP Location: Left Arm)   Pulse (!) 56   Temp 98 F (36.7 C) (Oral)   Resp 18   SpO2 96%   Physical Exam Vitals and nursing note reviewed.  Constitutional:      General: He is not in acute distress.    Appearance: Normal appearance.  HENT:     Mouth/Throat:     Pharynx: Oropharynx is clear.  Cardiovascular:     Rate and Rhythm:  Normal rate and regular rhythm.     Heart sounds: Normal heart sounds.  Pulmonary:     Effort: Pulmonary effort is normal.  Breath sounds: Normal breath sounds.  Musculoskeletal:     Comments: Full ROM of the left shoulder  Skin:    Findings: Abscess present.         Comments: Indurated area about 2 inches diameter on the left outer arm. Erythematous. Slightly tender.   Neurological:     Mental Status: He is alert and oriented to person, place, and time.     UC Treatments / Results  Labs (all labs ordered are listed, but only abnormal results are displayed) Labs Reviewed - No data to display  EKG  Radiology No results found.  Procedures Procedures  Medications Ordered in UC Medications - No data to display  Initial Impression / Assessment and Plan / UC Course  I have reviewed the triage vital signs and the nursing notes.  Pertinent labs & imaging results that were available during my care of the patient were reviewed by me and considered in my medical decision making (see chart for details).  Abscess left outer arm Indurated. Unable to drain at this time. Doxycycline  twice daily for 7 days.  Discussed other supportive care.  Heart rate 56.  Patient reports this is his normal resting heart rate, very active and works out 5 days a week.  He has no chest pain, shortness of breath, dizziness. On chart review this seems to be his baseline.   Final Clinical Impressions(s) / UC Diagnoses   Final diagnoses:  Abscess     Discharge Instructions      Doxycycline  twice a day for 7 days in a row. Take with food to avoid upset stomach. Finish the full course! Make sure to wear sunscreen or cover the skin with clothing, as this medicine can make you more sensitive to the sun.      ED Prescriptions     Medication Sig Dispense Auth. Provider   doxycycline  (VIBRAMYCIN ) 100 MG capsule Take 1 capsule (100 mg total) by mouth 2 (two) times daily for 7 days. 14 capsule  Sophiarose Eades, Asberry, PA-C      PDMP not reviewed this encounter.   Jeryl Asberry, [place] 06/11/24 [id number]

## 2024-06-11 NOTE — ED Triage Notes (Signed)
 Pt c/o bump/possible abscess to L bicep x1 week. Denies any drainage. Has tried to pop. Has applied rubbing alcohol and neosporin every night.

## 2024-06-12 ENCOUNTER — Encounter: Payer: Self-pay | Admitting: Cardiovascular Disease

## 2024-06-12 DIAGNOSIS — K219 Gastro-esophageal reflux disease without esophagitis: Secondary | ICD-10-CM

## 2024-06-12 NOTE — Telephone Encounter (Signed)
Please refer to [facility]

## 2024-06-17 NOTE — Telephone Encounter (Signed)
 Last read by [name] at 11:04AM on 06/14/2024.

## 2024-06-18 ENCOUNTER — Ambulatory Visit: Admitting: Cardiology

## 2024-07-24 ENCOUNTER — Other Ambulatory Visit: Payer: Self-pay | Admitting: Cardiology

## 2024-07-27 ENCOUNTER — Encounter: Payer: Self-pay | Admitting: Cardiovascular Disease

## 2024-07-29 MED ORDER — HYDROCHLOROTHIAZIDE 12.5 MG PO CAPS: 12.5000 mg | ORAL_CAPSULE | Freq: Every day | ORAL | 3 refills | Status: AC

## 2024-08-14 ENCOUNTER — Encounter: Payer: Self-pay | Admitting: Gastroenterology

## 2024-08-14 ENCOUNTER — Ambulatory Visit: Admitting: Gastroenterology

## 2024-08-14 VITALS — BP 124/72 | HR 50 | Ht 69.0 in | Wt 197.4 lb

## 2024-08-14 DIAGNOSIS — K219 Gastro-esophageal reflux disease without esophagitis: Secondary | ICD-10-CM | POA: Diagnosis not present

## 2024-08-14 DIAGNOSIS — R1310 Dysphagia, unspecified: Secondary | ICD-10-CM

## 2024-08-14 DIAGNOSIS — Z1211 Encounter for screening for malignant neoplasm of colon: Secondary | ICD-10-CM

## 2024-08-14 NOTE — Progress Notes (Signed)
 Discussed the use of AI scribe software for clinical note transcription with the patient, who gave verbal consent to proceed.  HPI : John Delacruz is a 63 year old male with a history of coronary artery disease status post CABG, former tobacco user who presents with dysphagia and recent changes in reflux symptoms.  He had been experiencing chest discomfort localized to the center of his chest for several months.   The chest discomfort is described as a burning sensation that intensified during exercise and subsided as he continued. The discomfort felt similar to symptoms he experienced three years ago, which led him to contact his cardiologist.  He underwent a myocardial perfusion study which was normal and his chest pain was not felt to be cardiac in etiology. He was prescribed medication to reduce stomach acid, but he did not notice any difference and discontinued it after one month.  Since then, he has modified his diet by eliminating mints and chocolates and avoiding eating before workouts, which has alleviated the chest discomfort.  He reports occasional swallowing difficulties over the past year and a half, describing a sensation of tightness when swallowing, which sometimes requires him to cough and swallow again or drink water. No episodes of food getting stuck or causing choking. No heartburn, acid taste in the mouth, or regurgitation of stomach contents. He also denies abdominal pain, nausea, vomiting, or nocturnal symptoms.  He has a history of smoking for forty years but quit after his heart surgery.  He believes he had a colonoscopy a few years ago, believes it was from [place].  He states this was normal and he was recommended to repeat in 10 years.   He denies any chronic lower GI symptoms.  No family history of GI malignancy.  MPS June 2025   Pharmacological stress test was performed using IV Lexiscan . The ECG  was not diagnostic due to pharmacologic protocol.    LV perfusion is  normal. There is no evidence of ischemia. There is no  evidence of infarction.    Left ventricular function is normal. Nuclear stress EF: 69%, LV cavity  size is normal.    Coronary calcium  was present on the attenuation correction CT images.  Moderate coronary calcifications were present. Coronary calcifications  were present in the left anterior descending artery and right coronary  artery distribution(s). .   No significant incidental findings in the visualized chest. Prior CABG.  Past Medical History:  Diagnosis Date   Anginal pain 02/2022   Coronary artery disease    Hypertension      Past Surgical History:  Procedure Laterality Date   CARDIAC CATHETERIZATION     CORONARY ARTERY BYPASS GRAFT N/A 03/28/2022   Procedure: CORONARY ARTERY BYPASS GRAFTING X4, USING LEFT INTERNAL MAMMARY ARTERY, LEFT AND RIGHT GREATER SAPHENOUS VEIN HARVESTED ENDOSCOPICALLY;  Surgeon: [name], MD;  Location: [facility] OR;  Service: Open Heart Surgery;  Laterality: N/A;   FRACTURE SURGERY     HERNIA REPAIR     LEFT HEART CATH AND CORONARY ANGIOGRAPHY N/A 03/17/2022   Procedure: LEFT HEART CATH AND CORONARY ANGIOGRAPHY;  Surgeon: [name], MD;  Location: [facility];  Service: Cardiovascular;  Laterality: N/A;   TEE WITHOUT CARDIOVERSION N/A 03/28/2022   Procedure: TRANSESOPHAGEAL ECHOCARDIOGRAM (TEE);  Surgeon: [name], MD;  Location: [facility] OR;  Service: Open Heart Surgery;  Laterality: N/A;   Family History  Problem Relation Age of Onset   Hypertension Mother    Kidney disease  Mother    Lupus Sister    Social History   Tobacco Use   Smoking status: Former    Current packs/day: 0.00    Average packs/day: 1 pack/day for 35.0 years (35.0 ttl pk-yrs)    Types: Cigarettes    Start date: 03/28/1987    Quit date: 03/27/2022    Years since quitting: 2.3   Smokeless tobacco: Never   Tobacco comments:    Quit on 03/27/22 and has not resumed smoking  Vaping Use   Vaping status:  Never Used  Substance Use Topics   Alcohol use: Not Currently    Alcohol/week: 3.0 - 4.0 standard drinks of alcohol    Types: 3 - 4 Cans of beer per week    Comment: not drinking currently   Drug use: No   Current Outpatient Medications  Medication Sig Dispense Refill   acetaminophen  (TYLENOL ) 500 MG tablet Take 1-2 tablets (500-1,000 mg total) by mouth every 6 (six) hours as needed. 30 tablet 0   aspirin  EC 81 MG tablet Take 1 tablet (81 mg total) by mouth daily. Swallow whole.     atorvastatin  (LIPITOR) 40 MG tablet Take 1 tablet (40 mg total) by mouth daily. 90 tablet 3   bisacodyl  (DULCOLAX) 5 MG EC tablet Take 5 mg by mouth daily as needed for moderate constipation. (Patient not taking: Reported on 05/06/2024)     carvedilol  (COREG ) 6.25 MG tablet Take 1 tablet (6.25 mg total) by mouth 2 (two) times daily. 180 tablet 3   hydrochlorothiazide  (MICROZIDE ) 12.5 MG capsule Take 1 capsule (12.5 mg total) by mouth daily. 90 capsule 3   methylPREDNISolone  (MEDROL  DOSEPAK) 4 MG TBPK tablet Take as directed with food (Patient not taking: Reported on 05/06/2024) 21 tablet 0   nitroGLYCERIN  (NITROSTAT ) 0.4 MG SL tablet Place 1 tablet (0.4 mg total) under the tongue every 5 (five) minutes as needed for chest pain. The proper use and anticipated side effects of nitroglycerine has been carefully explained.  If a single episode of chest pain is not relieved by one tablet, the patient will try another within 5 minutes; and if this doesn't relieve the pain, the patient is instructed to call 911 for transportation to an emergency department. 30 tablet 0   oxymetazoline (AFRIN) 0.05 % nasal spray Place 1 spray into both nostrils 2 (two) times daily as needed for congestion.     pantoprazole  (PROTONIX ) 20 MG tablet Take 1 tablet (20 mg total) by mouth daily. 30 tablet 0   VITAMIN D PO Take 1 capsule by mouth daily.     No current facility-administered medications for this visit.   Allergies  Allergen  Reactions   Lisinopril      Blurred vision      Review of Systems: All systems reviewed and negative except where noted in HPI.    No results found.  Physical Exam: BP 124/72 (BP Location: Left Arm, Patient Position: Sitting, Cuff Size: Normal)   Pulse (!) 50   Ht 5' 9 (1.753 m)   Wt 197 lb 6 oz (89.5 kg)   BMI 29.15 kg/m  Constitutional: Pleasant,well-developed, African-American male in no acute distress. HEENT: Normocephalic and atraumatic. Conjunctivae are normal. No scleral icterus. Neck supple.  Cardiovascular: Normal rate, regular rhythm.  Pulmonary/chest: Effort normal and breath sounds normal. No wheezing, rales or rhonchi. Abdominal: Soft, nondistended, nontender. Bowel sounds active throughout. There are no masses palpable. No hepatomegaly. Extremities: no edema Lymphadenopathy: No cervical adenopathy noted. Neurological: Alert and oriented to  person place and time. Skin: Skin is warm and dry. No rashes noted. Psychiatric: Normal mood and affect. Behavior is normal.  CBC    Component Value Date/Time   WBC 7.1 09/29/2022 0933   WBC 10.4 03/31/2022 0057   RBC 4.83 09/29/2022 0933   RBC 3.14 (L) 03/31/2022 0057   HGB 12.4 (L) 09/29/2022 0933   HCT 38.6 09/29/2022 0933   PLT 291 09/29/2022 0933   MCV 80 09/29/2022 0933   MCH 25.7 (L) 09/29/2022 0933   MCH 29.0 03/31/2022 0057   MCHC 32.1 09/29/2022 0933   MCHC 33.1 03/31/2022 0057   RDW 14.3 09/29/2022 0933   LYMPHSABS 1.1 03/15/2022 1229   MONOABS 1.2 (H) 12/18/2013 1147   EOSABS 0.1 03/15/2022 1229   BASOSABS 0.1 03/15/2022 1229    CMP     Component Value Date/Time   NA 140 12/01/2022 0949   K 4.3 12/01/2022 0949   CL 103 12/01/2022 0949   CO2 25 12/01/2022 0949   GLUCOSE 130 (H) 12/01/2022 0949   GLUCOSE 117 (H) 03/31/2022 0057   BUN 19 12/01/2022 0949   CREATININE 1.59 (H) 12/01/2022 0949   CALCIUM  10.0 12/01/2022 0949   PROT 6.5 09/29/2022 0933   ALBUMIN  4.2 09/29/2022 0933   AST 15  09/29/2022 0933   ALT 17 09/29/2022 0933   ALKPHOS 81 09/29/2022 0933   BILITOT 0.3 09/29/2022 0933   GFRNONAA >60 03/31/2022 0057   GFRAA 60 (L) 12/19/2013 0658       Latest Ref Rng & Units 09/29/2022    9:33 AM 04/19/2022    9:19 AM 03/31/2022   12:57 AM  CBC EXTENDED  WBC 3.4 - 10.8 x10E3/uL 7.1  6.3  10.4   RBC 4.14 - 5.80 x10E6/uL 4.83  3.81  3.14   Hemoglobin 13.0 - 17.7 g/dL 87.5  89.4  9.1   HCT 62.4 - 51.0 % 38.6  31.7  27.5   Platelets 150 - 450 x10E3/uL 291  392  185       ASSESSMENT AND PLAN:  64 year old male with history of coronary artery disease status post CABG, with recent development of burning chest pain during exercise.  Stress test negative.  Symptoms improved when he stopped eating before exercise and eliminated provocative foods such as mints and chocolate. He also describes an intermittent history of dysphagia and sensation of transient food impaction.  Dysphagia Intermittent swallowing difficulties with sensation of food impaction. Differential includes esophageal stricture or narrowing, less likely a mass. Smoking history increases risk for Barrett's/esophageal cancer - Schedule upper endoscopy (EGD) to evaluate for esophageal stricture or other abnormalities.  Gastroesophageal reflux disease (GERD) Symptoms consistent with GERD, improved with dietary modifications. No longstanding or nocturnal symptoms. - Continue dietary modifications. - Monitor for symptom recurrence and consider further evaluation if symptoms return. -No need for acid suppressive therapy given improvement in symptoms with dietary modification  Colon cancer screening - Request records from [facility] for colonoscopy timing.  The details, risks (including bleeding, perforation, infection, missed lesions, medication reactions and possible hospitalization or surgery if complications occur), benefits, and alternatives to EGD with possible biopsy and possible dilation were  discussed with the patient and he consents to proceed.    Recording duration: 15 minutes     Ryott Rafferty E. Stacia, MD [facility]   Verena Mems, MD

## 2024-08-14 NOTE — Patient Instructions (Signed)
 It has been recommended to you by your physician that you have a(n) Upper Endoscopy completed. Per your request, we did not schedule the procedure(s) today. Please contact our office at [phone] should you decide to have the procedure completed. You will be scheduled for a pre-visit and procedure at that time.  _______________________________________________________  If your blood pressure at your visit was 140/90 or greater, please contact your primary care physician to follow up on this.  _______________________________________________________  If you are age 39 or older, your body mass index should be between 23-30. Your Body mass index is 29.15 kg/m. If this is out of the aforementioned range listed, please consider follow up with your Primary Care Provider.  If you are age 32 or younger, your body mass index should be between 19-25. Your Body mass index is 29.15 kg/m. If this is out of the aformentioned range listed, please consider follow up with your Primary Care Provider.   ________________________________________________________  The [facility] providers would like to encourage you to use MYCHART to communicate with providers for non-urgent requests or questions.  Due to long hold times on the telephone, sending your provider a message by [facility] may be a faster and more efficient way to get a response.  Please allow 48 business hours for a response.  Please remember that this is for non-urgent requests.  _______________________________________________________  [facility] is using a team-based approach to care.  Your team is made up of your doctor and two to three APPS. Our APPS (Nurse Practitioners and Physician Assistants) work with your physician to ensure care continuity for you. They are fully qualified to address your health concerns and develop a treatment plan. They communicate directly with your gastroenterologist to care for you. Seeing the Advanced Practice  Practitioners on your physician's team can help you by facilitating care more promptly, often allowing for earlier appointments, access to diagnostic testing, procedures, and other specialty referrals.

## 2024-08-15 ENCOUNTER — Telehealth: Payer: Self-pay | Admitting: Gastroenterology

## 2024-08-15 ENCOUNTER — Other Ambulatory Visit: Payer: Self-pay

## 2024-08-15 ENCOUNTER — Encounter: Payer: Self-pay | Admitting: Gastroenterology

## 2024-08-15 DIAGNOSIS — R1310 Dysphagia, unspecified: Secondary | ICD-10-CM

## 2024-08-15 NOTE — Telephone Encounter (Signed)
 Received a call from patient, he is requesting a nurse fu call to discuss. Please review and advise   Thank you

## 2024-08-15 NOTE — Telephone Encounter (Signed)
 Pt scheduled for propofol  egd with dilation 08/16/24 with Dr Stacia on 08/16/24 arrival time 12:30pm. Amb ref in epic and instructions sent via mychart.

## 2024-08-16 ENCOUNTER — Encounter: Payer: Self-pay | Admitting: Gastroenterology

## 2024-08-16 ENCOUNTER — Ambulatory Visit (AMBULATORY_SURGERY_CENTER): Admitting: Gastroenterology

## 2024-08-16 VITALS — BP 103/58 | HR 52 | Temp 97.9°F | Resp 12 | Ht 69.0 in | Wt 197.0 lb

## 2024-08-16 DIAGNOSIS — K222 Esophageal obstruction: Secondary | ICD-10-CM | POA: Diagnosis not present

## 2024-08-16 DIAGNOSIS — K2980 Duodenitis without bleeding: Secondary | ICD-10-CM

## 2024-08-16 DIAGNOSIS — R1319 Other dysphagia: Secondary | ICD-10-CM

## 2024-08-16 DIAGNOSIS — R1310 Dysphagia, unspecified: Secondary | ICD-10-CM | POA: Diagnosis not present

## 2024-08-16 DIAGNOSIS — K2100 Gastro-esophageal reflux disease with esophagitis, without bleeding: Secondary | ICD-10-CM

## 2024-08-16 DIAGNOSIS — K317 Polyp of stomach and duodenum: Secondary | ICD-10-CM | POA: Diagnosis not present

## 2024-08-16 DIAGNOSIS — K2289 Other specified disease of esophagus: Secondary | ICD-10-CM | POA: Diagnosis not present

## 2024-08-16 MED ORDER — SODIUM CHLORIDE 0.9 % IV SOLN: 500.0000 mL | Freq: Once | INTRAVENOUS | Status: DC

## 2024-08-16 NOTE — Patient Instructions (Addendum)
-   Patient has a contact number available for emergencies. The signs and symptoms of potential delayed complications were discussed with the patient. Return to normal activities tomorrow. Written discharge instructions were provided to the patient. - Resume previous diet. - Continue present medications. - Await pathology results.  YOU HAD AN ENDOSCOPIC PROCEDURE TODAY AT [facility]:   Refer to the procedure report that was given to you for any specific questions about what was found during the examination.  If the procedure report does not answer your questions, please call your gastroenterologist to clarify.  If you requested that your care partner not be given the details of your procedure findings, then the procedure report has been included in a sealed envelope for you to review at your convenience later.  YOU SHOULD EXPECT: Some feelings of bloating in the abdomen. Passage of more gas than usual.  Walking can help get rid of the air that was put into your GI tract during the procedure and reduce the bloating. If you had a lower endoscopy (such as a colonoscopy or flexible sigmoidoscopy) you may notice spotting of blood in your stool or on the toilet paper. If you underwent a bowel prep for your procedure, you may not have a normal bowel movement for a few days.  Please Note:  You might notice some irritation and congestion in your nose or some drainage.  This is from the oxygen used during your procedure.  There is no need for concern and it should clear up in a day or so.  SYMPTOMS TO REPORT IMMEDIATELY: Following upper endoscopy (EGD)  Vomiting of blood or coffee ground material  New chest pain or pain under the shoulder blades  Painful or persistently difficult swallowing  New shortness of breath  Fever of 100F or higher  Black, tarry-looking stools  For urgent or emergent issues, a gastroenterologist can be reached at any hour by calling [phone]. Do not use  MyChart messaging for urgent concerns.    DIET:  We do recommend a small meal at first, but then you may proceed to your regular diet.  Drink plenty of fluids but you should avoid alcoholic beverages for 24 hours.  ACTIVITY:  You should plan to take it easy for the rest of today and you should NOT DRIVE or use heavy machinery until tomorrow (because of the sedation medicines used during the test).    FOLLOW UP: Our staff will call the number listed on your records the next business day following your procedure.  We will call around 7:15- 8:00 am to check on you and address any questions or concerns that you may have regarding the information given to you following your procedure. If we do not reach you, we will leave a message.     If any biopsies were taken you will be contacted by phone or by letter within the next 1-3 weeks.  Please call us at [phone] if you have not heard about the biopsies in 3 weeks.    SIGNATURES/CONFIDENTIALITY: You and/or your care partner have signed paperwork which will be entered into your electronic medical record.  These signatures attest to the fact that that the information above on your After Visit Summary has been reviewed and is understood.  Full responsibility of the confidentiality of this discharge information lies with you and/or your care-partner.

## 2024-08-16 NOTE — Progress Notes (Signed)
 Called to room to assist during endoscopic procedure.  Patient ID and intended procedure confirmed with present staff. Received instructions for my participation in the procedure from the performing physician.

## 2024-08-16 NOTE — Progress Notes (Signed)
 Sedate, gd SR, tolerated procedure well, VSS, report to RN

## 2024-08-16 NOTE — Op Note (Signed)
 [facility] Patient Name: John Delacruz Procedure Date: 08/16/2024 1:08 PM MRN: [id number] Endoscopist: Glendia E. Stacia , MD, [id number] Age: 63 Referring MD:  Date of Birth: 05-17-1961 Gender: Male Account #: [id number] Procedure:                Upper GI endoscopy Indications:              Dysphagia Medicines:                Monitored Anesthesia Care Procedure:                Pre-Anesthesia Assessment:                           - Prior to the procedure, a History and Physical                            was performed, and patient medications and                            allergies were reviewed. The patient's tolerance of                            previous anesthesia was also reviewed. The risks                            and benefits of the procedure and the sedation                            options and risks were discussed with the patient.                            All questions were answered, and informed consent                            was obtained. Prior Anticoagulants: The patient has                            taken no anticoagulant or antiplatelet agents                            except for aspirin . ASA Grade Assessment: III - A                            patient with severe systemic disease. After                            reviewing the risks and benefits, the patient was                            deemed in satisfactory condition to undergo the                            procedure.  After obtaining informed consent, the endoscope was                            passed under direct vision. Throughout the                            procedure, the patient's blood pressure, pulse, and                            oxygen saturations were monitored continuously. The                            Olympus scope [id number] was introduced through the                            mouth, and advanced to the third part of duodenum.                             The upper GI endoscopy was accomplished without                            difficulty. The patient tolerated the procedure                            well. Scope In: Scope Out: Findings:                 A widely patent Schatzki ring was found at the                            gastroesophageal junction. A guidewire was placed                            and the scope was withdrawn. Dilation was performed                            with a Savary dilator with no resistance at 19 mm.                            The dilation site was examined following endoscope                            reinsertion and showed no change. Estimated blood                            loss: none.                           The exam of the esophagus was otherwise normal.                           Biopsies were obtained from the proximal and distal  esophagus with cold forceps for histology of                            suspected eosinophilic esophagitis. Estimated blood                            loss was minimal.                           The entire examined stomach was normal.                           A few 3 to 6 mm sessile polyps were found in the                            duodenal bulb. Biopsies were taken with a cold                            forceps for histology. Estimated blood loss was                            minimal.                           The exam of the duodenum was otherwise normal. Complications:            No immediate complications. Estimated Blood Loss:     Estimated blood loss was minimal. Impression:               - Widely patent Schatzki ring. Dilated.                           - Normal stomach.                           - A few duodenal polyps. Biopsied. These appeared                            reactive/inflammatory in appearance.                           - Biopsies were taken with a cold forceps for                            evaluation of  eosinophilic esophagitis. Recommendation:           - Patient has a contact number available for                            emergencies. The signs and symptoms of potential                            delayed complications were discussed with the                            patient. Return to normal activities tomorrow.  Written discharge instructions were provided to the                            patient.                           - Resume previous diet.                           - Continue present medications.                           - Await pathology results. Ilze Roselli E. Stacia, MD 08/16/2024 1:33:06 PM This report has been signed electronically.

## 2024-08-16 NOTE — Progress Notes (Signed)
 History and Physical Interval Note:  08/16/2024 1:01 PM  John Delacruz  has presented today for endoscopic procedure(s), with the diagnosis of  Encounter Diagnosis  Name Primary?   Esophageal dysphagia Yes  .  The various methods of evaluation and treatment have been discussed with the patient and/or family. After consideration of risks, benefits and other options for treatment, the patient has consented to  the endoscopic procedure(s).   The patient's history has been reviewed, patient examined, no change in status, stable for endoscopic procedure(s).  I have reviewed the patient's chart and labs.  Questions were answered to the patient's satisfaction.     [name] E. Stacia, MD [facility]

## 2024-08-19 ENCOUNTER — Telehealth: Payer: Self-pay | Admitting: *Deleted

## 2024-08-19 NOTE — Telephone Encounter (Signed)
  Follow up Call-     08/16/2024   12:46 PM  Call back number  Post procedure Call Back phone  # [phone]  Permission to leave phone message Yes     Patient questions:  Do you have a fever, pain , or abdominal swelling? No. Pain Score  0 *  Have you tolerated food without any problems? Yes.    Have you been able to return to your normal activities? Yes.    Do you have any questions about your discharge instructions: Diet   No. Medications  No. Follow up visit  No.  Do you have questions or concerns about your Care? No.  Actions: * If pain score is 4 or above: No action needed, pain <4.

## 2024-08-21 LAB — SURGICAL PATHOLOGY

## 2024-08-24 ENCOUNTER — Ambulatory Visit: Payer: Self-pay | Admitting: Gastroenterology

## 2024-08-24 NOTE — Progress Notes (Signed)
 John Delacruz,  The biopsies of your duodenum showed benign inflammatory changes, likely related to stomach acid exposure. The biopsies of your esophagus also showed benign changes likely related to acid reflux.  There was no evidence of eosinophilic esophagitis.  I would recommend you continue your dietary modifications to limit your reflux symptoms.

## 2024-09-09 ENCOUNTER — Ambulatory Visit: Admitting: Cardiovascular Disease

## 2024-09-19 ENCOUNTER — Encounter: Payer: Self-pay | Admitting: Gastroenterology

## 2024-09-20 ENCOUNTER — Ambulatory Visit: Admission: EM | Admit: 2024-09-20 | Discharge: 2024-09-20 | Disposition: A | Discharge status: Home / Self Care

## 2024-09-20 ENCOUNTER — Encounter: Payer: Self-pay | Admitting: Emergency Medicine

## 2024-09-20 DIAGNOSIS — L02414 Cutaneous abscess of left upper limb: Secondary | ICD-10-CM

## 2024-09-20 DIAGNOSIS — E1122 Type 2 diabetes mellitus with diabetic chronic kidney disease: Secondary | ICD-10-CM | POA: Insufficient documentation

## 2024-09-20 MED ORDER — DOXYCYCLINE HYCLATE 100 MG PO CAPS: 100.0000 mg | ORAL_CAPSULE | Freq: Two times a day (BID) | ORAL | 0 refills | Status: AC

## 2024-09-20 NOTE — ED Triage Notes (Signed)
 Pt reports recurrence of L upper arm abscess x1 week. Reports he had one in this area earlier this summer that resolved with antibiotics. Pt denies pain, but reports the area is starting to get tender. Pt has applied alcohol to the site with no relief - he was just trying to keep it clean. No med use for symptoms.

## 2024-09-20 NOTE — Discharge Instructions (Signed)
 Use warm compress to affected area 20 minutes at a time a couple times a day.  Be sure to complete antibiotics in their entirety.

## 2024-09-20 NOTE — ED Provider Notes (Signed)
 [facility]    CSN: [id number] Arrival date & time: 09/20/24  9182      History   Chief Complaint Chief Complaint  Patient presents with   Abscess    HPI John Delacruz is a 63 y.o. male.   Patient presents today due to recurring abscess of left upper arm that has been worsening over the past week.  Patient states that he would like to receive antibiotics only and declines incision and drainage in office.  The history is provided by the patient.  Abscess   Past Medical History:  Diagnosis Date   Anginal pain 02/2022   Arthritis    Coronary artery disease    Hypertension     Patient Active Problem List   Diagnosis Date Noted   Type 2 diabetes mellitus with diabetic chronic kidney disease (HCC) 09/20/2024   Pain in left hip 01/16/2024   S/P CABG x 4 03/28/2022   CAD in native artery 03/17/2022   Angina pectoris    Nonspecific abnormal electrocardiogram (ECG) (EKG) 02/28/2022   Overweight (BMI 25.0-29.9) 02/28/2022   Precordial pain 02/28/2022   Diverticulitis 12/18/2013   HTN (hypertension) 01/01/2013    Past Surgical History:  Procedure Laterality Date   CARDIAC CATHETERIZATION     CORONARY ARTERY BYPASS GRAFT N/A 03/28/2022   Procedure: CORONARY ARTERY BYPASS GRAFTING X4, USING LEFT INTERNAL MAMMARY ARTERY, LEFT AND RIGHT GREATER SAPHENOUS VEIN HARVESTED ENDOSCOPICALLY;  Surgeon: [name], MD;  Location: [facility] OR;  Service: Open Heart Surgery;  Laterality: N/A;   FRACTURE SURGERY     HERNIA REPAIR     LEFT HEART CATH AND CORONARY ANGIOGRAPHY N/A 03/17/2022   Procedure: LEFT HEART CATH AND CORONARY ANGIOGRAPHY;  Surgeon: [name], MD;  Location: [facility] INVASIVE CV LAB;  Service: Cardiovascular;  Laterality: N/A;   TEE WITHOUT CARDIOVERSION N/A 03/28/2022   Procedure: TRANSESOPHAGEAL ECHOCARDIOGRAM (TEE);  Surgeon: [name], MD;  Location: [facility];  Service: Open Heart Surgery;  Laterality: N/A;       Home Medications    Prior to  Admission medications   Medication Sig Start Date End Date Taking? Authorizing Provider  acetaminophen  (TYLENOL ) 500 MG tablet Take 1-2 tablets (500-1,000 mg total) by mouth every 6 (six) hours as needed. 04/01/22  Yes Barrett, Erin R, PA-C  aspirin  EC 81 MG tablet Take 1 tablet (81 mg total) by mouth daily. Swallow whole. 05/13/24  Yes Dunn, Dayna N, PA-C  atorvastatin  (LIPITOR) 40 MG tablet Take 1 tablet (40 mg total) by mouth daily. 11/01/23  Yes Croitoru, Mihai, MD  carvedilol  (COREG ) 6.25 MG tablet Take 1 tablet (6.25 mg total) by mouth 2 (two) times daily. 11/01/23  Yes Croitoru, Mihai, MD  doxycycline  (VIBRAMYCIN ) 100 MG capsule Take 1 capsule (100 mg total) by mouth 2 (two) times daily. 09/20/24  Yes [name], PA-C  hydrochlorothiazide  (MICROZIDE ) 12.5 MG capsule Take 1 capsule (12.5 mg total) by mouth daily. 07/29/24  Yes Croitoru, Mihai, MD  oxymetazoline (AFRIN) 0.05 % nasal spray Place 1 spray into both nostrils 2 (two) times daily as needed for congestion.   Yes [provider]  VITAMIN D PO Take 1 capsule by mouth daily.   Yes [provider]  bisacodyl  (DULCOLAX) 5 MG EC tablet Take 5 mg by mouth daily as needed for moderate constipation. Patient not taking: Reported on 08/16/2024    [provider]  nitroGLYCERIN  (NITROSTAT ) 0.4 MG SL tablet Place 1 tablet (0.4 mg total) under the  tongue every 5 (five) minutes as needed for chest pain. The proper use and anticipated side effects of nitroglycerine has been carefully explained.  If a single episode of chest pain is not relieved by one tablet, the patient will try another within 5 minutes; and if this doesn't relieve the pain, the patient is instructed to call 911 for transportation to an emergency department. Patient not taking: Reported on 08/16/2024 05/06/24   [name], PA-C  pantoprazole  (PROTONIX ) 20 MG tablet Take 1 tablet (20 mg total) by mouth daily. Patient not taking: No sig reported  05/06/24   [name], PA-C    Family History Family History  Problem Relation Age of Onset   Hypertension Mother    Kidney disease Mother    Lupus Sister    Esophageal cancer Neg Hx    Colon cancer Neg Hx    Rectal cancer Neg Hx    Stomach cancer Neg Hx     Social History Social History   Tobacco Use   Smoking status: Former    Current packs/day: 0.00    Average packs/day: 1 pack/day for 35.0 years (35.0 ttl pk-yrs)    Types: Cigarettes    Start date: 03/28/1987    Quit date: 03/27/2022    Years since quitting: 2.4    Passive exposure: Past   Smokeless tobacco: Never   Tobacco comments:    Quit on 03/27/22 and has not resumed smoking  Vaping Use   Vaping status: Never Used  Substance Use Topics   Alcohol use: Not Currently    Alcohol/week: 3.0 - 4.0 standard drinks of alcohol    Types: 3 - 4 Cans of beer per week    Comment: not drinking currently   Drug use: No     Allergies   Lisinopril    Review of Systems Review of Systems   Physical Exam Triage Vital Signs ED Triage Vitals [09/20/24 0902]  Encounter Vitals Group     BP (!) 145/79     Girls Systolic BP Percentile      Girls Diastolic BP Percentile      Boys Systolic BP Percentile      Boys Diastolic BP Percentile      Pulse Rate (!) 58     Resp 18     Temp 97.9 F (36.6 C)     Temp Source Oral     SpO2 97 %     Weight      Height      Head Circumference      Peak Flow      Pain Score 0     Pain Loc      Pain Education      Exclude from Growth Chart    No data found.  Updated Vital Signs BP 133/76 (BP Location: Right Arm)   Pulse (!) 58   Temp 97.9 F (36.6 C) (Oral)   Resp 18   SpO2 97%   Visual Acuity Right Eye Distance:   Left Eye Distance:   Bilateral Distance:    Right Eye Near:   Left Eye Near:    Bilateral Near:     Physical Exam Vitals and nursing note reviewed.  Constitutional:      General: He is not in acute distress.    Appearance: Normal appearance. He is not  ill-appearing, toxic-appearing or diaphoretic.  Eyes:     General: No scleral icterus. Cardiovascular:     Rate and Rhythm: Normal rate and regular rhythm.  Heart sounds: Normal heart sounds.  Pulmonary:     Effort: Pulmonary effort is normal. No respiratory distress.     Breath sounds: Normal breath sounds. No wheezing or rhonchi.  Skin:    General: Skin is warm.     Findings: Abscess present.         Comments: Raised, erythematous, fluctuant lesion noted of left upper forearm  Neurological:     Mental Status: He is alert and oriented to person, place, and time.  Psychiatric:        Mood and Affect: Mood normal.        Behavior: Behavior normal.      UC Treatments / Results  Labs (all labs ordered are listed, but only abnormal results are displayed) Labs Reviewed - No data to display  EKG   Radiology No results found.  Procedures Procedures (including critical care time)  Medications Ordered in UC Medications - No data to display  Initial Impression / Assessment and Plan / UC Course  I have reviewed the triage vital signs and the nursing notes.  Pertinent labs & imaging results that were available during my care of the patient were reviewed by me and considered in my medical decision making (see chart for details).     Patient declined incision and drainage in office, sent home with doxycycline  100 mg twice daily for 10 days, and instructions to perform warm compresses 20 minutes at a time couple times a day to affected area. Final Clinical Impressions(s) / UC Diagnoses   Final diagnoses:  Abscess of left arm     Discharge Instructions      Use warm compress to affected area 20 minutes at a time a couple times a day.  Be sure to complete antibiotics in their entirety.    ED Prescriptions     Medication Sig Dispense Auth. Provider   doxycycline  (VIBRAMYCIN ) 100 MG capsule Take 1 capsule (100 mg total) by mouth 2 (two) times daily. 20 capsule  [name], PA-C      PDMP not reviewed this encounter.   [name], PA-C 09/20/24 [phone]

## 2024-09-23 ENCOUNTER — Encounter: Payer: Self-pay | Admitting: Radiology

## 2024-09-23 ENCOUNTER — Encounter: Payer: Self-pay | Admitting: Cardiovascular Disease

## 2024-10-09 ENCOUNTER — Encounter: Payer: Self-pay | Admitting: Cardiovascular Disease

## 2024-10-09 ENCOUNTER — Ambulatory Visit: Attending: Cardiovascular Disease | Admitting: Cardiovascular Disease

## 2024-10-09 VITALS — BP 110/66 | HR 51 | Ht 69.5 in | Wt 197.0 lb

## 2024-10-09 DIAGNOSIS — I2510 Atherosclerotic heart disease of native coronary artery without angina pectoris: Secondary | ICD-10-CM | POA: Diagnosis not present

## 2024-10-09 DIAGNOSIS — I10 Essential (primary) hypertension: Secondary | ICD-10-CM

## 2024-10-09 DIAGNOSIS — E785 Hyperlipidemia, unspecified: Secondary | ICD-10-CM | POA: Diagnosis not present

## 2024-10-09 NOTE — Progress Notes (Signed)
 Cardiology Office Note:  .   Date:  10/12/2024  ID:  John Delacruz, DOB 07-20-1961, MRN [id number] PCP: Verena Mems, MD  [facility] Providers Cardiologist:  Jerel Balding, MD    History of Present Illness: .   John Delacruz is a 63 y.o. male with CAD (CABG x 4, LIMA-LAD, SVG-OM1, SVG-OM2, SVG-distal RCA, [place] 03/28/2022), dyslipidemia, HTN, preserved left ventricular systolic function, returning in follow-up.  He feels great.  He goes to the gym 5 days a week.  He can benchpress 195 pounds.  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  The patient specifically denies any chest pain at rest or with exertion, dyspnea at rest or with exertion, orthopnea, paroxysmal nocturnal dyspnea, syncope, palpitations, focal neurological deficits, intermittent claudication, lower extremity edema, unexplained weight gain, cough, hemoptysis or wheezing.  In June 2024 he was experiencing some chest discomfort and underwent a nuclear SPECT study that showed a normal pattern of perfusion without evidence of ischemia or infarction and an ejection fraction of 69%.  Metabolic control remains pretty good.  His hemoglobin A1c is 6.7%.  His most recent LDL of 68, HDL 41, normal triglycerides.  His creatinine has always been a little high, typically under 1.2-1.5 range.   Studies Reviewed: .       Nuclear stress test June 2025 Echocardiogram and cardiac catheterization 2023 05/02/2024 labs Cholesterol 127, HDL 41, LDL 68, triglycerides 94  hemoglobin A1c 6.7% Risk Assessment/Calculations:             Physical Exam:   VS:  BP 110/66   Pulse (!) 51   Ht 5' 9.5 (1.765 m)   Wt 197 lb (89.4 kg)   SpO2 95%   BMI 28.67 kg/m    Wt Readings from Last 3 Encounters:  10/09/24 197 lb (89.4 kg)  08/16/24 197 lb (89.4 kg)  08/14/24 197 lb 6 oz (89.5 kg)     General: Alert, oriented x3, no distress, looks very fit.  Well-healed sternotomy scar Head: no evidence of trauma, PERRL, EOMI, no  exophtalmos or lid lag, no myxedema, no xanthelasma; normal ears, nose and oropharynx Neck: normal jugular venous pulsations and no hepatojugular reflux; brisk carotid pulses without delay and no carotid bruits Chest: clear to auscultation, no signs of consolidation by percussion or palpation, normal fremitus, symmetrical and full respiratory excursions Cardiovascular: normal position and quality of the apical impulse, regular rhythm, normal first and second heart sounds, no murmurs, rubs or gallops Abdomen: no tenderness or distention, no masses by palpation, no abnormal pulsatility or arterial bruits, normal bowel sounds, no hepatosplenomegaly Extremities: no clubbing, cyanosis or edema; 2+ radial, ulnar and brachial pulses bilaterally; 2+ right femoral, posterior tibial and dorsalis pedis pulses; 2+ left femoral, posterior tibial and dorsalis pedis pulses; no subclavian or femoral bruits Neurological: grossly nonfocal Psych: Normal mood and affect   ASSESSMENT AND PLAN: .   CAD s/p CABG: Has made an excellent recovery from bypass surgery and remains very physically active and asymptomatic.  On beta-blockers, relatively low-dose, but also slightly bradycardic.  On aspirin  and effective lipid-lowering therapy. HLP: All lipid parameters are in target range.  Continue atorvastatin  40 mg daily HTN: Very well-controlled.  Continue carvedilol  and hydrochlorothiazide        Dispo:  Patient Instructions  Medication Instructions:  No changes *If you need a refill on your cardiac medications before your next appointment, please call your pharmacy*  Lab Work: None ordered If you have labs (blood work) drawn today and your  tests are completely normal, you will receive your results only by: MyChart Message (if you have MyChart) OR A paper copy in the mail If you have any lab test that is abnormal or we need to change your treatment, we will call you to review the results.  Testing/Procedures: None  ordered  Follow-Up: At [facility], you and your health needs are our priority.  As part of our continuing mission to provide you with exceptional heart care, our providers are all part of one team.  This team includes your primary Cardiologist (physician) and Advanced Practice Providers or APPs (Physician Assistants and Nurse Practitioners) who all work together to provide you with the care you need, when you need it.  Your next appointment:   1 year(s)  Provider:   Jerel Balding, MD    We recommend signing up for the patient portal called MyChart.  Sign up information is provided on this After Visit Summary.  MyChart is used to connect with patients for Virtual Visits (Telemedicine).  Patients are able to view lab/test results, encounter notes, upcoming appointments, etc.  Non-urgent messages can be sent to your provider as well.   To learn more about what you can do with MyChart, go to [email or web address].      Signed, Jerel Balding, MD

## 2024-10-09 NOTE — Patient Instructions (Signed)

## 2024-10-29 ENCOUNTER — Ambulatory Visit: Admitting: Cardiovascular Disease

## 2024-11-18 ENCOUNTER — Ambulatory Visit: Admitting: Cardiovascular Disease

## 2024-11-27 ENCOUNTER — Other Ambulatory Visit: Payer: Self-pay | Admitting: Cardiovascular Disease
